# Patient Record
Sex: Female | Born: 1935 | Race: Black or African American | Hispanic: No | Smoking: Former smoker
Health system: Southern US, Community
[De-identification: ages and names within clinical notes are randomized; demographics above are authoritative.]

## PROBLEM LIST (undated history)

## (undated) DIAGNOSIS — N182 Chronic kidney disease, stage 2 (mild): Secondary | ICD-10-CM

## (undated) DIAGNOSIS — J449 Chronic obstructive pulmonary disease, unspecified: Secondary | ICD-10-CM

## (undated) DIAGNOSIS — I10 Essential (primary) hypertension: Secondary | ICD-10-CM

## (undated) DIAGNOSIS — E785 Hyperlipidemia, unspecified: Secondary | ICD-10-CM

## (undated) HISTORY — DX: Essential (primary) hypertension: I10

## (undated) HISTORY — DX: Chronic obstructive pulmonary disease, unspecified: J44.9

## (undated) HISTORY — DX: Hyperlipidemia, unspecified: E78.5

---

## 1997-07-02 ENCOUNTER — Other Ambulatory Visit: Admission: RE | Admit: 1997-07-02 | Discharge: 1997-07-02 | Payer: Self-pay | Admitting: Nephrology

## 1997-10-08 ENCOUNTER — Other Ambulatory Visit: Admission: RE | Admit: 1997-10-08 | Discharge: 1997-10-08 | Payer: Self-pay | Admitting: Nephrology

## 1997-10-29 ENCOUNTER — Encounter: Admission: RE | Admit: 1997-10-29 | Discharge: 1998-01-27 | Payer: Self-pay | Admitting: Nephrology

## 1998-01-14 ENCOUNTER — Ambulatory Visit (HOSPITAL_COMMUNITY): Admission: RE | Admit: 1998-01-14 | Discharge: 1998-01-14 | Payer: Self-pay | Admitting: Nephrology

## 1998-02-06 ENCOUNTER — Encounter: Payer: Self-pay | Admitting: Emergency Medicine

## 1998-02-06 ENCOUNTER — Emergency Department (HOSPITAL_COMMUNITY): Admission: EM | Admit: 1998-02-06 | Discharge: 1998-02-06 | Payer: Self-pay | Admitting: Emergency Medicine

## 1998-05-12 ENCOUNTER — Inpatient Hospital Stay (HOSPITAL_COMMUNITY): Admission: AD | Admit: 1998-05-12 | Discharge: 1998-05-17 | Payer: Self-pay | Admitting: Nephrology

## 1998-05-14 ENCOUNTER — Encounter: Payer: Self-pay | Admitting: Cardiology

## 1999-01-27 ENCOUNTER — Encounter: Admission: RE | Admit: 1999-01-27 | Discharge: 1999-01-27 | Payer: Self-pay | Admitting: Nephrology

## 1999-01-27 ENCOUNTER — Encounter: Payer: Self-pay | Admitting: Nephrology

## 1999-01-30 ENCOUNTER — Emergency Department (HOSPITAL_COMMUNITY): Admission: EM | Admit: 1999-01-30 | Discharge: 1999-01-30 | Payer: Self-pay | Admitting: Emergency Medicine

## 1999-02-26 ENCOUNTER — Other Ambulatory Visit: Admission: RE | Admit: 1999-02-26 | Discharge: 1999-02-26 | Payer: Self-pay | Admitting: Nephrology

## 1999-03-18 ENCOUNTER — Encounter: Payer: Self-pay | Admitting: Nephrology

## 1999-03-18 ENCOUNTER — Encounter: Admission: RE | Admit: 1999-03-18 | Discharge: 1999-03-18 | Payer: Self-pay | Admitting: Nephrology

## 1999-09-02 ENCOUNTER — Encounter: Admission: RE | Admit: 1999-09-02 | Discharge: 1999-12-01 | Payer: Self-pay | Admitting: Nephrology

## 1999-12-11 ENCOUNTER — Emergency Department (HOSPITAL_COMMUNITY): Admission: EM | Admit: 1999-12-11 | Discharge: 1999-12-12 | Payer: Self-pay | Admitting: Emergency Medicine

## 1999-12-14 ENCOUNTER — Encounter: Admission: RE | Admit: 1999-12-14 | Discharge: 2000-01-01 | Payer: Self-pay | Admitting: Orthopedic Surgery

## 2000-02-02 ENCOUNTER — Encounter: Admission: RE | Admit: 2000-02-02 | Discharge: 2000-05-02 | Payer: Self-pay | Admitting: Nephrology

## 2000-05-30 ENCOUNTER — Encounter: Admission: RE | Admit: 2000-05-30 | Discharge: 2000-08-28 | Payer: Self-pay | Admitting: Nephrology

## 2000-08-29 ENCOUNTER — Encounter: Admission: RE | Admit: 2000-08-29 | Discharge: 2000-11-27 | Payer: Self-pay | Admitting: Nephrology

## 2000-10-03 ENCOUNTER — Emergency Department (HOSPITAL_COMMUNITY): Admission: EM | Admit: 2000-10-03 | Discharge: 2000-10-03 | Payer: Self-pay | Admitting: Emergency Medicine

## 2000-10-06 ENCOUNTER — Encounter (HOSPITAL_COMMUNITY): Admission: RE | Admit: 2000-10-06 | Discharge: 2001-01-04 | Payer: Self-pay

## 2001-01-05 ENCOUNTER — Other Ambulatory Visit: Admission: RE | Admit: 2001-01-05 | Discharge: 2001-01-05 | Payer: Self-pay | Admitting: Nephrology

## 2001-01-24 ENCOUNTER — Encounter: Admission: RE | Admit: 2001-01-24 | Discharge: 2001-04-24 | Payer: Self-pay | Admitting: Nephrology

## 2001-01-25 ENCOUNTER — Emergency Department (HOSPITAL_COMMUNITY): Admission: EM | Admit: 2001-01-25 | Discharge: 2001-01-25 | Payer: Self-pay | Admitting: Emergency Medicine

## 2001-01-25 ENCOUNTER — Encounter: Payer: Self-pay | Admitting: Emergency Medicine

## 2001-06-05 ENCOUNTER — Encounter: Payer: Self-pay | Admitting: Family Medicine

## 2001-06-05 ENCOUNTER — Encounter: Admission: RE | Admit: 2001-06-05 | Discharge: 2001-06-05 | Payer: Self-pay | Admitting: Family Medicine

## 2002-06-01 ENCOUNTER — Encounter: Payer: Self-pay | Admitting: Family Medicine

## 2002-06-01 ENCOUNTER — Encounter: Admission: RE | Admit: 2002-06-01 | Discharge: 2002-06-01 | Payer: Self-pay | Admitting: Family Medicine

## 2002-06-07 ENCOUNTER — Ambulatory Visit (HOSPITAL_COMMUNITY): Admission: RE | Admit: 2002-06-07 | Discharge: 2002-06-07 | Payer: Self-pay | Admitting: Family Medicine

## 2002-06-07 ENCOUNTER — Encounter: Payer: Self-pay | Admitting: Family Medicine

## 2002-06-12 ENCOUNTER — Encounter: Payer: Self-pay | Admitting: Family Medicine

## 2002-06-12 ENCOUNTER — Encounter: Admission: RE | Admit: 2002-06-12 | Discharge: 2002-06-12 | Payer: Self-pay | Admitting: Family Medicine

## 2003-03-14 ENCOUNTER — Emergency Department (HOSPITAL_COMMUNITY): Admission: EM | Admit: 2003-03-14 | Discharge: 2003-03-15 | Payer: Self-pay | Admitting: Emergency Medicine

## 2003-04-24 ENCOUNTER — Ambulatory Visit (HOSPITAL_COMMUNITY): Admission: RE | Admit: 2003-04-24 | Discharge: 2003-04-24 | Payer: Self-pay | Admitting: Surgery

## 2003-07-05 ENCOUNTER — Encounter: Admission: RE | Admit: 2003-07-05 | Discharge: 2003-07-05 | Payer: Self-pay | Admitting: Internal Medicine

## 2003-08-27 ENCOUNTER — Ambulatory Visit (HOSPITAL_COMMUNITY): Admission: RE | Admit: 2003-08-27 | Discharge: 2003-08-27 | Payer: Self-pay | Admitting: Gastroenterology

## 2003-10-31 ENCOUNTER — Emergency Department (HOSPITAL_COMMUNITY): Admission: EM | Admit: 2003-10-31 | Discharge: 2003-11-01 | Payer: Self-pay

## 2003-11-01 ENCOUNTER — Emergency Department (HOSPITAL_COMMUNITY): Admission: EM | Admit: 2003-11-01 | Discharge: 2003-11-01 | Payer: Self-pay | Admitting: Emergency Medicine

## 2003-12-16 ENCOUNTER — Encounter: Admission: RE | Admit: 2003-12-16 | Discharge: 2003-12-16 | Payer: Self-pay | Admitting: Internal Medicine

## 2004-07-27 ENCOUNTER — Emergency Department (HOSPITAL_COMMUNITY): Admission: EM | Admit: 2004-07-27 | Discharge: 2004-07-28 | Payer: Self-pay | Admitting: Emergency Medicine

## 2004-12-26 ENCOUNTER — Emergency Department (HOSPITAL_COMMUNITY): Admission: EM | Admit: 2004-12-26 | Discharge: 2004-12-26 | Payer: Self-pay | Admitting: Emergency Medicine

## 2005-03-18 ENCOUNTER — Inpatient Hospital Stay (HOSPITAL_COMMUNITY): Admission: EM | Admit: 2005-03-18 | Discharge: 2005-03-20 | Payer: Self-pay | Admitting: Emergency Medicine

## 2005-10-30 ENCOUNTER — Emergency Department (HOSPITAL_COMMUNITY): Admission: EM | Admit: 2005-10-30 | Discharge: 2005-10-30 | Payer: Self-pay | Admitting: Emergency Medicine

## 2005-11-09 ENCOUNTER — Encounter: Admission: RE | Admit: 2005-11-09 | Discharge: 2005-11-09 | Payer: Self-pay | Admitting: Internal Medicine

## 2007-03-31 ENCOUNTER — Inpatient Hospital Stay (HOSPITAL_COMMUNITY): Admission: EM | Admit: 2007-03-31 | Discharge: 2007-04-03 | Payer: Self-pay | Admitting: Emergency Medicine

## 2008-01-26 ENCOUNTER — Encounter: Admission: RE | Admit: 2008-01-26 | Discharge: 2008-01-26 | Payer: Self-pay | Admitting: Internal Medicine

## 2008-11-04 ENCOUNTER — Emergency Department (HOSPITAL_COMMUNITY): Admission: EM | Admit: 2008-11-04 | Discharge: 2008-11-04 | Payer: Self-pay | Admitting: Emergency Medicine

## 2009-10-31 ENCOUNTER — Inpatient Hospital Stay (HOSPITAL_COMMUNITY): Admission: EM | Admit: 2009-10-31 | Discharge: 2009-11-03 | Payer: Self-pay | Admitting: Emergency Medicine

## 2009-11-01 ENCOUNTER — Encounter (INDEPENDENT_AMBULATORY_CARE_PROVIDER_SITE_OTHER): Payer: Self-pay | Admitting: Internal Medicine

## 2010-02-07 ENCOUNTER — Encounter: Payer: Self-pay | Admitting: Internal Medicine

## 2010-02-08 ENCOUNTER — Encounter: Payer: Self-pay | Admitting: Internal Medicine

## 2010-04-01 LAB — GLUCOSE, CAPILLARY
Glucose-Capillary: 161 mg/dL — ABNORMAL HIGH (ref 70–99)
Glucose-Capillary: 167 mg/dL — ABNORMAL HIGH (ref 70–99)
Glucose-Capillary: 184 mg/dL — ABNORMAL HIGH (ref 70–99)
Glucose-Capillary: 214 mg/dL — ABNORMAL HIGH (ref 70–99)
Glucose-Capillary: 236 mg/dL — ABNORMAL HIGH (ref 70–99)

## 2010-04-01 LAB — LIPID PANEL
Cholesterol: 183 mg/dL (ref 0–200)
HDL: 44 mg/dL (ref >39)
LDL Cholesterol: 80 mg/dL (ref 0–99)
VLDL: 59 mg/dL — ABNORMAL HIGH (ref 0–40)

## 2010-04-01 LAB — BASIC METABOLIC PANEL
Calcium: 9.3 mg/dL (ref 8.4–10.5)
GFR calc Af Amer: 43 mL/min — ABNORMAL LOW (ref >60)
GFR calc non Af Amer: 35 mL/min — ABNORMAL LOW (ref >60)
Potassium: 3.7 mEq/L (ref 3.5–5.1)
Sodium: 139 mEq/L (ref 135–145)

## 2010-04-01 LAB — COMPREHENSIVE METABOLIC PANEL
AST: 22 U/L (ref 0–37)
Alkaline Phosphatase: 56 U/L (ref 39–117)
BUN: 17 mg/dL (ref 6–23)
CO2: 28 mEq/L (ref 19–32)
Calcium: 9.6 mg/dL (ref 8.4–10.5)
GFR calc non Af Amer: 42 mL/min — ABNORMAL LOW (ref >60)
Glucose, Bld: 157 mg/dL — ABNORMAL HIGH (ref 70–99)
Potassium: 3.2 mEq/L — ABNORMAL LOW (ref 3.5–5.1)

## 2010-04-01 LAB — MAGNESIUM
Magnesium: 2.1 mg/dL (ref 1.5–2.5)
Magnesium: 2.1 mg/dL (ref 1.5–2.5)

## 2010-04-01 LAB — TSH: TSH: 1.665 u[IU]/mL (ref 0.350–4.500)

## 2010-04-01 LAB — CBC
MCHC: 34 g/dL (ref 30.0–36.0)
Platelets: 222 10*3/uL (ref 150–400)
RBC: 5.34 MIL/uL — ABNORMAL HIGH (ref 3.87–5.11)
WBC: 10.4 10*3/uL (ref 4.0–10.5)

## 2010-04-01 LAB — CARDIAC PANEL(CRET KIN+CKTOT+MB+TROPI)
CK, MB: 2.6 ng/mL (ref 0.3–4.0)
CK, MB: 3.3 ng/mL (ref 0.3–4.0)
Relative Index: 2.3 (ref 0.0–2.5)
Relative Index: 2.6 — ABNORMAL HIGH (ref 0.0–2.5)
Total CK: 127 U/L (ref 7–177)
Troponin I: 0.03 ng/mL (ref 0.00–0.06)

## 2010-04-01 LAB — HEMOGLOBIN A1C
Hgb A1c MFr Bld: 10 % — ABNORMAL HIGH (ref <5.7)
Mean Plasma Glucose: 240 mg/dL — ABNORMAL HIGH (ref <117)

## 2010-04-02 LAB — BASIC METABOLIC PANEL
BUN: 17 mg/dL (ref 6–23)
CO2: 30 mEq/L (ref 19–32)
Chloride: 103 mEq/L (ref 96–112)
Creatinine, Ser: 1.27 mg/dL — ABNORMAL HIGH (ref 0.4–1.2)
GFR calc Af Amer: 50 mL/min — ABNORMAL LOW (ref >60)
Potassium: 3.3 mEq/L — ABNORMAL LOW (ref 3.5–5.1)

## 2010-04-02 LAB — URINALYSIS, ROUTINE W REFLEX MICROSCOPIC
Bilirubin Urine: NEGATIVE
Ketones, ur: NEGATIVE mg/dL
Leukocytes, UA: NEGATIVE
Nitrite: NEGATIVE
Protein, ur: 100 mg/dL — AB

## 2010-04-02 LAB — CBC
HCT: 47.2 % — ABNORMAL HIGH (ref 36.0–46.0)
MCH: 27.9 pg (ref 26.0–34.0)
MCV: 82.1 fL (ref 78.0–100.0)
Platelets: 215 10*3/uL (ref 150–400)
RBC: 5.76 MIL/uL — ABNORMAL HIGH (ref 3.87–5.11)
WBC: 9.5 10*3/uL (ref 4.0–10.5)

## 2010-04-02 LAB — GLUCOSE, CAPILLARY
Glucose-Capillary: 111 mg/dL — ABNORMAL HIGH (ref 70–99)
Glucose-Capillary: 139 mg/dL — ABNORMAL HIGH (ref 70–99)
Glucose-Capillary: 45 mg/dL — ABNORMAL LOW (ref 70–99)

## 2010-04-02 LAB — DIFFERENTIAL
Eosinophils Absolute: 0 10*3/uL (ref 0.0–0.7)
Eosinophils Relative: 0 % (ref 0–5)
Lymphocytes Relative: 13 % (ref 12–46)
Lymphs Abs: 1.2 10*3/uL (ref 0.7–4.0)
Monocytes Absolute: 0.3 10*3/uL (ref 0.1–1.0)

## 2010-04-02 LAB — POCT CARDIAC MARKERS: Troponin i, poc: <0.05 ng/mL (ref 0.00–0.09)

## 2010-04-02 LAB — RAPID URINE DRUG SCREEN, HOSP PERFORMED
Barbiturates: NOT DETECTED
Benzodiazepines: NOT DETECTED
Cocaine: NOT DETECTED
Opiates: NOT DETECTED

## 2010-04-02 LAB — CARDIAC PANEL(CRET KIN+CKTOT+MB+TROPI)
CK, MB: 4.1 ng/mL — ABNORMAL HIGH (ref 0.3–4.0)
Total CK: 141 U/L (ref 7–177)
Troponin I: 0.03 ng/mL (ref 0.00–0.06)

## 2010-04-23 LAB — GLUCOSE, CAPILLARY: Glucose-Capillary: 171 mg/dL — ABNORMAL HIGH (ref 70–99)

## 2010-06-05 NOTE — Discharge Summary (Signed)
Paula West, Paula West             ACCOUNT NO.:  [id number]   MEDICAL RECORD NO.:  [id number]          PATIENT TYPE:  INP   LOCATION:  4703                         FACILITY:  [facility]   PHYSICIAN:  Fleet Contras, M.D.    DATE OF BIRTH:  [birth year]   DATE OF ADMISSION:  03/31/2007  DATE OF DISCHARGE:  04/03/2007                               DISCHARGE SUMMARY   ADMISSION DIAGNOSES:  1. Right-sided abdominal pain.  2. Sinus bradycardia.  3. Moderately controlled type 2 diabetes mellitus.  4. Poorly controlled hypertension.  5. Acute renal insufficiency.   DISCHARGE DIAGNOSES:  1. Diverticulitis.  2. Renal insufficiency.  3. Sinus bradycardia.  4. Type 2 diabetes mellitus.  5. Bronchial asthma.   HISTORY OF PRESENTING ILLNESS:  [name] is a 77 year old African  American lady with multiple medical problems who presented to the  emergency room at the [facility] with 1-week history of  abdominal pain, bloating  mainly on the right side without radiation  associated with abdominal distention resulting in difficulty in  breathing.  She had no vomiting.  She had no nausea or diarrhea.  No  chest pain, orthopnea, palpitations, or PND.  She denies using any  NSAIDs, aspirin, blood in the stools, or melena.  At the emergency room,  her heart rate was in the 30s and 40s.  The patient was seen by a  cardiologist, Dr. Ritta Slot, who thought that this was likely  medication induced.  She was admitted to the hospital for further  workup.   HOSPITAL COURSE:  On admission, the patient was placed on a telemetry  bed.  Ultrasound scan of the abdomen was performed and this was negative  for gallstones.  Chest x-ray showed cardiomegaly with vascular  congestion.  Serial CPKs and troponin were performed and acute coronary  syndrome was ruled out.  CT scan of the abdomen showed scattered  diverticulosis in the colon, but no evidence of diverticulitis or  abscess collection.  Her  condition improved during the course of  hospitalization.  Her BUN and creatinine on admission was 33/1.85, this  improved to 24/1.47.  Her lipase was 50.  Liver function test was within  normal limits.  On April 03, 2007, she was feeling much better and  tolerating full diet.  Her vital signs were stable.  She was therefore  considered stable for discharge home.   CONDITION ON DISCHARGE:  Stable.   DISPOSITION:  Home.   DISCHARGE MEDICATIONS:  1. Ceftin 500 mg p.o. b.i.d.  2. Flagyl 500 mg t.i.d.  3. Amlodipine 5 mg daily.  4. Benicar 1 tablet p.o. daily 20 mg.   She was advised to stop Bystolic, which had caused her bradycardia.  She  was to follow up with me in the office in about 2-4 weeks.      Fleet Contras, M.D.  Electronically Signed     EA/MEDQ  D:  05/11/2007  T:  05/11/2007  Job:  [id number]
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
## 2010-06-05 NOTE — Op Note (Signed)
NAMEARIZBETH, CAWTHORN                       ACCOUNT NO.:  [id number]   MEDICAL RECORD NO.:  [id number]                   PATIENT TYPE:  AMB   LOCATION:  ENDO                                 FACILITY:  [facility]   PHYSICIAN:  James L. Malon Kindle., M.D.          DATE OF BIRTH:  14-Dec-1932   DATE OF PROCEDURE:  08/27/2003  DATE OF DISCHARGE:                                 OPERATIVE REPORT   PROCEDURE:  Colonoscopy.   ENDOSCOPIST:  Llana Aliment. Edwards, M.D.   MEDICATIONS:  Fentanyl 75 mcg, Versed 6 mg IV.   SCOPE:  The Olympus pediatric adjustable colonoscope.   INDICATIONS FOR PROCEDURE:  The patient has had a change in bowel habits,  with worsening constipation.  She had a CT scan showing a markedly thickened  sigmoid colon.  Could not rule out a possible lesion there, other than  diverticular disease.   DESCRIPTION OF PROCEDURE:  The procedure had been explained to the patient  and a consent obtained.  With the patient in the left lateral decubitus  position, the Olympus scope was inserted and advanced.  The prep was  excellent.  The patient had very marked diverticular disease of the sigmoid  colon.  Several minutes and position changes were required, and after we  finally passed this, we were able to advance rapidly to the cecum.  The  ileocecal valve and appendiceal orifice were seen.  The scope was withdrawn  in the cecum.  The ascending colon, transverse colon and descending colon  were seen well.  Again, marked diverticular disease of the sigmoid colon,  with no evidence of tumor.  No evidence of diverticulitis.  The scope was  withdrawn.  The patient tolerated the procedure well, and was monitored, and received  supplemental oxygen throughout.   ASSESSMENT:  1. Abnormal CT on - code #793.4 - probably due to diverticular disease.  No     evidence of tumor on examination.  2. Severe diverticulosis - code #47.10.   PLAN:  1. Will give the patient a diverticulosis  information sheet.  2. Continue the patient on the MiraLax indefinitely.  3. Encourage a high-fiber diet.  4. I will see her back again as needed.                                               James L. Malon Kindle., M.D.    Waldron Session  D:  08/27/2003  T:  08/27/2003  Job:  [id number]   cc:   Fleet Contras, M.D.  [address]  [place]  [place] [address]  Fax: [phone]

## 2010-06-05 NOTE — Discharge Summary (Signed)
Paula West, Paula West             ACCOUNT NO.:  [id number]   MEDICAL RECORD NO.:  [id number]           PATIENT TYPE:   LOCATION:                               FACILITY:  [facility]   PHYSICIAN:  Fleet Contras, M.D.    DATE OF BIRTH:  01-Feb-1932   DATE OF ADMISSION:  03/18/2005  DATE OF DISCHARGE:  03/20/2005                                 DISCHARGE SUMMARY   DISCHARGE PRESENTING ILLNESS:  Paula West is a 77 year old African-American  lady with a past medical history significant for hypertension, type 2  diabetes mellitus, dyslipidemia and degenerative joint disease of the hips  and knees who presented to the emergency room at [facility] with a  2-3 month history of unsteady gait and frequent falls.  This was of gradual  onset and progressively getting worse.  She had no headaches, nausea or  vomiting.  She does also have dizzy spells and she feels unsteady when she  walks and falls frequently.  Her last fall was the night of presentation  when she tried to step off on a step to her home and she missed a step and  fell on her right hip, which was painful and she had difficulty walking.  At  the emergency room, she had an x-ray and a CT scan of the right hip which  revealed some contusions and degenerative joint disease but no fractures.  She had also had episodes of memory problems but had no confusion or urinary  incontinence.  She had no fevers or chills.  A CT scan of the brain shows  evidence of atrophy and ventriculomegaly, suggestive of hydrocephalus;  specifically, normal pressure hydrocephalus.  She was, therefore, admitted  to the hospital for further evaluation and appropriate therapy.   HOSPITAL COURSE:  On admission, the patient was admitted to a telemetry  monitored bed.  She received intravenous fluids with half normal saline at  75 mL an hour.  CBGs were monitored before meals and at bedtime and was on  NovoLog sliding scale to a moderately sensitive protocol.  An  MRI scan of  the brain was performed to further evaluate the hydrocephalus and this  showed atrophy and ventriculomegaly and again suspicion of normal pressure  hydrocephalus.  Carotid Doppler was performed and this was negative for  significant plaques or carotid stenosis.  Further laboratory data performed  included sed rate which was 10.  Vitamin B12 levels are 373, folic acid 17,  TSH was 2.36 and her hemoglobin A1c was 11.3.  A Neurology consult was  requested and patient was seen by Dr. Avie Echevaria, who suggested that patient  had some dementia and suspicion of normal pressure hydrocephalus.  He  performed a stand, walk, seat test x6 with two chairs 20 feet apart.  Patient was able to perform in about 53 seconds, which he thought was very  fast.  He, therefore, deferred lumbar puncture at this time and recommended  physical therapy to work with the patient balance training prior to a lumbar  puncture which could be performed in the outpatient.  He also recommended  the patient had diabetic neuropathy which will be treated.  The patient has  had no dizziness, staggering or falls since admission and today she feels  she is ready to go home.   She is afebrile.  She is well hydrated and her vital signs are stable.  Her  CPK this morning is 209.  CHEST:  Is clear to auscultation.  HEART:  Sounds 1 and 2 are heard with no murmurs.  ABDOMEN:  Is benign.  Bowel sounds are present.  EXTREMITIES:  Shows no edema, no calf tenderness, swelling.  CNS:  Is alert and oriented x3 with no focal neurological deficits.   Her laboratory data showed white count of 6.8, hemoglobin 15, hematocrit  44.1, platelet count of 265.  Sodium is 139, potassium 3.9, chloride 103,  bicarbonate 228, BUN 17, creatinine 1.4 and glucose of 224.  She is  considered stable for discharge home today.   DISCHARGE DIAGNOSES:  1.  Ataxia, likely due to a normal pressure hydrocephalus.  2.  Right hip contusion, pain has  improved.  3.  Type 2 diabetes mellitus, moderately controlled.  4.  Systemic hypertension, moderately controlled.  5.  Diabetic neuropathy.   DISCHARGE MEDICATIONS WOULD INCLUDE:  1.  Lyrica 50 mg two times daily.  2.  Protonix 40 mg daily.  3.  Lantus insulin 10 units two times daily.  4.  Benicar HCT 40/25 one by mouth daily.  5.  Zocor 40 mg daily.  6.  Metformin 700 mg two times daily.  7.  Glucotrol-XL 10 mg two times daily.  8.  Vicodin 5/500 1-2 tablets q.6 hourly p.r.n. for right hip pain.   CONDITION ON DISCHARGE:  Is stable.   DISPOSITION:  For home.   FOLLOWUP:  Will be with me in 1 week, with Dr. Avie Echevaria in 1-2 weeks.      Fleet Contras, M.D.  Electronically Signed     EA/MEDQ  D:  03/20/2005  T:  03/21/2005  Job:  [id number]   cc:   Genene Churn. Love, M.D.  Fax: [phone]

## 2010-06-22 ENCOUNTER — Inpatient Hospital Stay (HOSPITAL_COMMUNITY)
Admission: EM | Admit: 2010-06-22 | Discharge: 2010-06-25 | DRG: 065 | Disposition: A | Discharge status: Home Health | Payer: PRIVATE HEALTH INSURANCE | Attending: Internal Medicine | Admitting: Internal Medicine

## 2010-06-22 ENCOUNTER — Emergency Department (HOSPITAL_COMMUNITY): Payer: PRIVATE HEALTH INSURANCE

## 2010-06-22 DIAGNOSIS — R32 Unspecified urinary incontinence: Secondary | ICD-10-CM | POA: Diagnosis present

## 2010-06-22 DIAGNOSIS — R279 Unspecified lack of coordination: Secondary | ICD-10-CM | POA: Diagnosis present

## 2010-06-22 DIAGNOSIS — Z79899 Other long term (current) drug therapy: Secondary | ICD-10-CM

## 2010-06-22 DIAGNOSIS — I6350 Cerebral infarction due to unspecified occlusion or stenosis of unspecified cerebral artery: Principal | ICD-10-CM | POA: Diagnosis present

## 2010-06-22 DIAGNOSIS — I129 Hypertensive chronic kidney disease with stage 1 through stage 4 chronic kidney disease, or unspecified chronic kidney disease: Secondary | ICD-10-CM | POA: Diagnosis present

## 2010-06-22 DIAGNOSIS — N189 Chronic kidney disease, unspecified: Secondary | ICD-10-CM | POA: Diagnosis present

## 2010-06-22 DIAGNOSIS — Z794 Long term (current) use of insulin: Secondary | ICD-10-CM

## 2010-06-22 DIAGNOSIS — R5381 Other malaise: Secondary | ICD-10-CM | POA: Diagnosis present

## 2010-06-22 DIAGNOSIS — E1142 Type 2 diabetes mellitus with diabetic polyneuropathy: Secondary | ICD-10-CM | POA: Diagnosis present

## 2010-06-22 DIAGNOSIS — E785 Hyperlipidemia, unspecified: Secondary | ICD-10-CM | POA: Diagnosis present

## 2010-06-22 DIAGNOSIS — E1149 Type 2 diabetes mellitus with other diabetic neurological complication: Secondary | ICD-10-CM | POA: Diagnosis present

## 2010-06-22 DIAGNOSIS — G911 Obstructive hydrocephalus: Secondary | ICD-10-CM | POA: Diagnosis present

## 2010-06-22 DIAGNOSIS — E876 Hypokalemia: Secondary | ICD-10-CM | POA: Diagnosis not present

## 2010-06-22 DIAGNOSIS — M5030 Other cervical disc degeneration, unspecified cervical region: Secondary | ICD-10-CM | POA: Diagnosis present

## 2010-06-22 DIAGNOSIS — D45 Polycythemia vera: Secondary | ICD-10-CM | POA: Diagnosis present

## 2010-06-22 LAB — TROPONIN I: Troponin I: <0.3 ng/mL (ref <0.30)

## 2010-06-22 LAB — BASIC METABOLIC PANEL
Calcium: 9.9 mg/dL (ref 8.4–10.5)
Creatinine, Ser: 1.36 mg/dL — ABNORMAL HIGH (ref 0.4–1.2)
GFR calc Af Amer: 46 mL/min — ABNORMAL LOW (ref >60)
GFR calc non Af Amer: 38 mL/min — ABNORMAL LOW (ref >60)
Sodium: 138 mEq/L (ref 135–145)

## 2010-06-22 LAB — PROTIME-INR
INR: 0.9 (ref 0.00–1.49)
Prothrombin Time: 12.4 seconds (ref 11.6–15.2)

## 2010-06-22 LAB — CBC
HCT: 46.7 % — ABNORMAL HIGH (ref 36.0–46.0)
Hemoglobin: 16.3 g/dL — ABNORMAL HIGH (ref 12.0–15.0)
MCH: 27.4 pg (ref 26.0–34.0)
MCHC: 34.9 g/dL (ref 30.0–36.0)
RBC: 5.95 MIL/uL — ABNORMAL HIGH (ref 3.87–5.11)

## 2010-06-22 LAB — DIFFERENTIAL
Basophils Absolute: 0 10*3/uL (ref 0.0–0.1)
Basophils Relative: 1 % (ref 0–1)
Lymphocytes Relative: 30 % (ref 12–46)
Monocytes Absolute: 0.5 10*3/uL (ref 0.1–1.0)
Monocytes Relative: 6 % (ref 3–12)
Neutro Abs: 5.5 10*3/uL (ref 1.7–7.7)
Neutrophils Relative %: 62 % (ref 43–77)

## 2010-06-22 LAB — URINALYSIS, ROUTINE W REFLEX MICROSCOPIC
Bilirubin Urine: NEGATIVE
Hgb urine dipstick: NEGATIVE
Specific Gravity, Urine: 1.012 (ref 1.005–1.030)
Urobilinogen, UA: 0.2 mg/dL (ref 0.0–1.0)
pH: 6 (ref 5.0–8.0)

## 2010-06-22 LAB — GLUCOSE, CAPILLARY
Glucose-Capillary: 143 mg/dL — ABNORMAL HIGH (ref 70–99)
Glucose-Capillary: 231 mg/dL — ABNORMAL HIGH (ref 70–99)

## 2010-06-22 LAB — ETHANOL: Alcohol, Ethyl (B): <11 mg/dL — ABNORMAL HIGH (ref 0–10)

## 2010-06-23 ENCOUNTER — Inpatient Hospital Stay (HOSPITAL_COMMUNITY): Payer: PRIVATE HEALTH INSURANCE

## 2010-06-23 LAB — POTASSIUM: Potassium: 3.9 mEq/L (ref 3.5–5.1)

## 2010-06-23 LAB — BASIC METABOLIC PANEL
BUN: 26 mg/dL — ABNORMAL HIGH (ref 6–23)
Calcium: 9.5 mg/dL (ref 8.4–10.5)
Creatinine, Ser: 1.37 mg/dL — ABNORMAL HIGH (ref 0.4–1.2)
GFR calc non Af Amer: 38 mL/min — ABNORMAL LOW (ref >60)
Glucose, Bld: 157 mg/dL — ABNORMAL HIGH (ref 70–99)
Potassium: 2.9 mEq/L — ABNORMAL LOW (ref 3.5–5.1)

## 2010-06-23 LAB — CBC
HCT: 41.9 % (ref 36.0–46.0)
MCH: 26.9 pg (ref 26.0–34.0)
MCHC: 34.1 g/dL (ref 30.0–36.0)
MCV: 78.8 fL (ref 78.0–100.0)
Platelets: 238 10*3/uL (ref 150–400)
RDW: 15.9 % — ABNORMAL HIGH (ref 11.5–15.5)

## 2010-06-24 DIAGNOSIS — I6350 Cerebral infarction due to unspecified occlusion or stenosis of unspecified cerebral artery: Secondary | ICD-10-CM

## 2010-06-24 DIAGNOSIS — I517 Cardiomegaly: Secondary | ICD-10-CM

## 2010-06-24 LAB — CBC
HCT: 42.7 % (ref 36.0–46.0)
Hemoglobin: 14.2 g/dL (ref 12.0–15.0)
MCH: 26.4 pg (ref 26.0–34.0)
RBC: 5.37 MIL/uL — ABNORMAL HIGH (ref 3.87–5.11)

## 2010-06-24 LAB — BASIC METABOLIC PANEL
CO2: 29 mEq/L (ref 19–32)
Chloride: 102 mEq/L (ref 96–112)
Creatinine, Ser: 1.23 mg/dL — ABNORMAL HIGH (ref 0.4–1.2)
GFR calc Af Amer: 52 mL/min — ABNORMAL LOW (ref >60)
Glucose, Bld: 137 mg/dL — ABNORMAL HIGH (ref 70–99)

## 2010-06-24 LAB — COMPREHENSIVE METABOLIC PANEL
ALT: 18 U/L (ref 0–35)
Alkaline Phosphatase: 73 U/L (ref 39–117)
BUN: 19 mg/dL (ref 6–23)
CO2: 29 mEq/L (ref 19–32)
GFR calc non Af Amer: 43 mL/min — ABNORMAL LOW (ref >60)
Glucose, Bld: 246 mg/dL — ABNORMAL HIGH (ref 70–99)
Potassium: 3.7 mEq/L (ref 3.5–5.1)
Sodium: 139 mEq/L (ref 135–145)

## 2010-06-24 LAB — URINE CULTURE: Culture  Setup Time: 201206050838

## 2010-06-24 LAB — LIPID PANEL
Cholesterol: 211 mg/dL — ABNORMAL HIGH (ref 0–200)
LDL Cholesterol: 128 mg/dL — ABNORMAL HIGH (ref 0–99)

## 2010-06-24 LAB — GLUCOSE, CAPILLARY
Glucose-Capillary: 142 mg/dL — ABNORMAL HIGH (ref 70–99)
Glucose-Capillary: 252 mg/dL — ABNORMAL HIGH (ref 70–99)
Glucose-Capillary: 184 mg/dL — ABNORMAL HIGH (ref 70–99)

## 2010-06-24 LAB — CARDIAC PANEL(CRET KIN+CKTOT+MB+TROPI): Total CK: 110 U/L (ref 7–177)

## 2010-06-25 LAB — GLUCOSE, CAPILLARY: Glucose-Capillary: 99 mg/dL (ref 70–99)

## 2010-06-25 NOTE — Discharge Summary (Signed)
NAMEMERON, BOCCHINO             ACCOUNT NO.:  [id number]  MEDICAL RECORD NO.:  [id number]  LOCATION:  3743                         FACILITY:  [facility]  PHYSICIAN:  Thad Ranger, MD       DATE OF BIRTH:  Jul 25, 1932  DATE OF ADMISSION:  06/22/2010 DATE OF DISCHARGE:                        DISCHARGE SUMMARY - REFERRING   PRIMARY CARE PHYSICIAN:  Fleet Contras, MD  DISCHARGE DIAGNOSES: 1. Acute cerebrovascular accident. 2. Hypokalemia. 3. Hyperlipidemia. 4. Generalized debility. 5. Diabetes mellitus, type 2, with peripheral neuropathy. 6. Hypertension. 7. History of cognitive deficits with stable hydrocephalus. 8. Obesity. 9. Degenerative disk disease of cervical spine.  MEDICATIONS AT THE TIME OF DISCHARGE: 1. Aspirin 325 mg p.o. daily. 2. Meclizine 12.5 mg p.o. b.i.d. 3. Protonix 40 mg p.o. daily. 4. Albuterol inhaler 2 puffs inhaled every 4 hours as needed for     shortness of breath. 5. Albuterol nebs 1 ampule inhaled every 4 hours as needed for     shortness of breath. 6. Hydralazine 50 mg p.o. b.i.d. 7. Hydrochlorothiazide 25 mg p.o. daily. 8. Lantus insulin 60 units in a.m., 50 units in p.m. 9. Patanol ophthalmic eye drops in both eyes every 6-8 hours. 10.Simvastatin 40 mg p.o. daily.  BRIEF HISTORY OF PRESENT ILLNESS AT THE TIME OF ADMISSION:  Ms. Rotunno is a 77 year old female with multiple medical problems as listed above who presented to the emergency room with being weaker for the past 2-3 days to the point that she had difficulty ambulating.  She also presented as weakness on the left side.  CT head in the emergency room showed only atrophy; however, per the admitting physician, the patient was seen pushing herself with both arms with equal strength on standing, hence it was questionable if she actually had any weakness on the left side.  The patient was admitted for further workup.  RADIOLOGICAL DATA: 1. CT head, June 22, 2010, atrophy and chronic small  vessel disease,     chronic ventricular prominence, presumed related to central     atrophy, no acute findings. 2. MRI of the head on June 24, 2010, showed motion degraded exam,     acute/subacute infarct, lateral aspect right thalamus bordering the     posterior limb of the right internal capsule, global atrophy,     ventricular prominence may be related to atrophy although mild     hydrocephalus cannot be excluded, moderate small vessel disease     type changes. 3. MRA showed intracranial atherosclerotic-type changes. 4. A 2-D echocardiogram, June 24, 2010, showed EF of 55-60%, normal     wall motion, no regional wall motion abnormalities. 5. Carotid Dopplers, June 24, 2010, showed no evidence of ICA stenosis     bilaterally.  BRIEF HOSPITALIZATION COURSE:  Ms. Venturini is a 77 year old female with a history of diabetes with peripheral neuropathy, degenerative disk disease of the cervical spine, hypertension, ataxia presented with acute- on-chronic weakness. 1. Acute CVA.  The patient was admitted to the monitored floor.  CT     head was obtained which did not show any acute stroke, however,     showed atrophy and chronic small vessel disease.  Urine culture  showed only 15,000 colonies.  Hence, no frank UTI.  During my     examination and the patient's history, she did maintain that she     did have left-sided weakness and difficulty in ambulation at home,     hence I obtained an MRI of the brain which confirmed acute/subacute     infarct in the lateral aspect of the right thalamus bordering the     posterior limb of the right internal capsule.  The patient was not     on aspirin prior to the admission.  She was placed on full-dose     aspirin and continued on statins.  The patient underwent full     stroke workup including 2-D echocardiogram which did not show any     thrombus.  No regional wall motion abnormalities and preserved EF     at 55-60%.  Carotid Dopplers were also  negative for any ICA     stenosis.  PT/OT evaluation was done and the patient does need home     physical therapy.  She has 24x7 care at home and declined any     rehab. 2. Hypertension, remained stable.  The patient was continued on her     home medications including hydrochlorothiazide and hydralazine. 3. Diabetes mellitus.  HbA1c 10.8 with mean plasma glucose of 263,     poorly controlled.  The patient is on high-dose Lantus with no     hypoglycemia episodes.  The patient will continue home dose of     insulin and follow up with her primary care physician for a tighter     glycemic control given her acute CVA.  PHYSICAL EXAMINATION:  VITAL SIGNS:  At the time of discharge, temperature 98.6, pulse 57, respirations 16, blood pressure 110/68, O2 sat 98% on room air. GENERAL:  The patient is alert, awake, and oriented not in any acute distress. HEENT:  Anicteric sclerae and conjunctivae.  Pupils equal and reactive to light and accommodation.  EOMI. NECK:  Supple.  No lymphopathy, no JVD. CARDIOVASCULAR:  S1 and S2 clear.  Regular rate and rhythm. CHEST:  Clear to auscultation bilaterally. ABDOMEN:  Soft, nontender, nondistended.  Normal bowel sounds. EXTREMITIES:  No cyanosis, clubbing, or edema noted.  PERTINENT LABORATORY DATA:  Cholesterol 211, HDL 44, LDL 128.  HbA1c 10.8.  Cardiac panel remained negative for any acute ACS.  Discharge followup with Dr. Concepcion Elk within 7-10 days.  DISCHARGE TIME:  Thirty-five minutes.     Thad Ranger, MD     RR/MEDQ  D:  06/25/2010  T:  06/25/2010  Job:  [id number]  cc:   Fleet Contras, M.D.  Electronically Signed by Andres Labrum RAI  on 06/25/2010 01:02:47 PM

## 2010-06-29 NOTE — H&P (Signed)
[name], West             ACCOUNT NO.:  [id number]  MEDICAL RECORD NO.:  [id number]  LOCATION:  [facility]                         FACILITY:  [facility]  PHYSICIAN:  Lonia Blood, M.D.       DATE OF BIRTH:  June 26, 1932  DATE OF ADMISSION:  06/22/2010 DATE OF DISCHARGE:                             HISTORY & PHYSICAL   PRIMARY CARE PHYSICIAN:  Fleet Contras, MD  CHIEF COMPLAINT:  Weak more to the left than the right.  HISTORY OF PRESENT ILLNESS:  [name] is a 77 year old woman with multiple chronic medical problems including diabetes mellitus type 2 with peripheral neuropathy, ataxia, cognitive deficits with some hydrocephalus, urinary incontinence, hypertension, and morbid obesity who came to the emergency room today complaining of being weaker for the past 2-3 days to the point where she has difficulty ambulating.  She thinks she is weaker on the left side.  Emergency room physician assessed her and thought she had a right brain stroke, but the CT scan of the head shows only atrophy and no new findings compared to previously.  Also when I walked in the room, I saw her pushing herself with both arms with equal strength and standing, so I am not sure if she is weaker on the left side.  The patient thinks she is weaker on the left side and she says she has actually been having difficulty ambulating around the house.  She denies any fever, chills, or being acutely ill.  She does not know why she got weaker lately.  She says that the medication has not been changed and she has not seen her primary care physician regularly.  She says that she takes now Lantus 60 units in the morning and 50 units at night and says that dose was given to her last year in the hospital.  Now reviewing the records from the hospital, she was sent home on 40 units of Lantus twice a day.  The patient says that she checks her sugars in the morning and they are into the high 200s, so hypoglycemia might not be  present.  Otherwise, she denies any chest pain.  PAST MEDICAL HISTORY:  Diabetes mellitus type 2 with peripheral neuropathy, hypertension, ataxia, cognitive deficits with stable hydrocephalus, morbid obesity, chronic bronchitis, degenerative disk disease of cervical spine.  SOCIAL HISTORY:  The patient lives with her daughter.  She does not smoke cigarettes, does not drink alcohol.  FAMILY HISTORY:  Positive for hypertension.  HOME MEDICATIONS:  Unsure.  She knows she takes Lantus.  Otherwise when asked about the Benicar, it has been a long discussion and I could not make out if she takes it or not.  The hospital pharmacist is in the process of contacting the patient's pharmacy to figure out what she is taking.  REVIEW OF SYSTEMS:  Per HPI.  All other systems reviewed and are negative.  PHYSICAL EXAM:  VITAL SIGNS:  Upon admission, temperature 97.6, blood pressure 138/70, pulse 70, respirations 20, saturation 96% on room air. GENERAL:  The patient is alert and oriented to place, person, and time. HEENT:  Head, normocephalic and atraumatic.  Eyes, pupils equal, round, and reactive to light.  Extraocular muscles  are intact.  Throat clear. NECK:  Supple.  No JVD.  The general aspect is one of the patient with a truncal obesity and atrophy of both lower extremities bilaterally. CHEST:  Barrel-shaped, hyperinflated.  No wheezes.  No rhonchi.  No crackles. HEART:  Regular without murmurs, rubs, or gallops. ABDOMEN:  Soft and nontender. LOWER EXTREMITIES:  Without edema. NEUROLOGIC:  Cranial nerves II through XII intact.  Speech intact. Repetition intact.  No dysarthria.  She is extremely hard-of-hearing. There is no pronator drift.  She is able to bear weight on both her lower extremities, but she walks with a wide-based gait.  LABORATORY DATA:  On admission, troponin-I less than 0.3.  Sodium is 138, potassium 3.5, chloride 96, bicarbonate 30, BUN 28, creatinine 1.3, glucose 243.   GFR 38.  Alcohol less than 11.  PTT 27, PT 12.4.  Head CT without contrast shows atrophy, chronic small-vessel disease.  ASSESSMENT/PLAN:  This is a 77 year old woman presenting with worsening weakness.  I personally could not find focal deficits.  I think she has generalized weakness, ataxia, so basically she has acute on chronic ataxic weakness which is most likely multifactorial.  She has component from peripheral neuropathy from diabetes, a component from the cervical spine disease, and a component from her hydrocephalus.  Review of the latest MRI of head reveals that she does have stenosis significantly on the right PCA, hence left brain stroke is always possible to happen.  At this point in time, I do think there is any evidence that she had a right brain stroke.  My approach to this is to continue her on aspirin and Zocor, frequent neurological exams, obtain a PT/OT evaluation tomorrow.  Now reviewing the records, it has been awhile since she had an MRI of the cervical spine.  So in case we were extremely worried about worsening ataxia and gait, I will probably start a workup with an MRI of the cervical spine and proceed from there.  Otherwise, I have noted that she has a slight polycythemia, so assume she might have a degree of dehydration.  Her oxygen saturation seems to be good on room air, so I do not think she is chronically hypoxic.  I am going to give her fluids overnight, hold the HCTZ, assess her tomorrow again and try to obtain the home medications to better understand the situation.     Lonia Blood, M.D.     SL/MEDQ  D:  06/22/2010  T:  06/22/2010  Job:  [id number]  cc:   Fleet Contras, M.D.  Electronically Signed by Lonia Blood M.D. on 06/29/2010 09:50:25 AM

## 2010-10-12 LAB — CBC
Hemoglobin: 12.8
Hemoglobin: 14.2
MCHC: 34
MCHC: 33.2
MCV: 81.1
MCV: 81.1
MCV: 81.5
Platelets: 252
RBC: 4.64
RDW: 16.2 — ABNORMAL HIGH
WBC: 7.7

## 2010-10-12 LAB — COMPREHENSIVE METABOLIC PANEL
AST: 29
Albumin: 4
Chloride: 103
Creatinine, Ser: 1.85 — ABNORMAL HIGH
GFR calc Af Amer: 32 — ABNORMAL LOW
Potassium: 3.6
Sodium: 136
Total Bilirubin: 0.7

## 2010-10-12 LAB — POCT CARDIAC MARKERS
CKMB, poc: 1.1
Troponin i, poc: <0.05

## 2010-10-12 LAB — BASIC METABOLIC PANEL
CO2: 27
CO2: 27
Calcium: 10.2
Chloride: 108
Creatinine, Ser: 1.14
GFR calc Af Amer: 42 — ABNORMAL LOW
Glucose, Bld: 114 — ABNORMAL HIGH
Potassium: 3.9
Sodium: 143

## 2010-10-12 LAB — DIFFERENTIAL
Basophils Absolute: 0
Eosinophils Relative: 1
Lymphocytes Relative: 28
Lymphs Abs: 2.1
Monocytes Absolute: 0.5

## 2010-10-12 LAB — URINALYSIS, ROUTINE W REFLEX MICROSCOPIC
Nitrite: NEGATIVE
Protein, ur: NEGATIVE
Urobilinogen, UA: 0.2

## 2010-10-12 LAB — CK TOTAL AND CKMB (NOT AT ARMC)
CK, MB: 2.2
Total CK: 96

## 2010-12-18 ENCOUNTER — Encounter (INDEPENDENT_AMBULATORY_CARE_PROVIDER_SITE_OTHER): Payer: PRIVATE HEALTH INSURANCE | Admitting: Ophthalmology

## 2010-12-18 DIAGNOSIS — E11319 Type 2 diabetes mellitus with unspecified diabetic retinopathy without macular edema: Secondary | ICD-10-CM

## 2010-12-18 DIAGNOSIS — H3581 Retinal edema: Secondary | ICD-10-CM

## 2010-12-18 DIAGNOSIS — I10 Essential (primary) hypertension: Secondary | ICD-10-CM

## 2010-12-18 DIAGNOSIS — E1165 Type 2 diabetes mellitus with hyperglycemia: Secondary | ICD-10-CM

## 2011-01-13 ENCOUNTER — Ambulatory Visit: Payer: PRIVATE HEALTH INSURANCE | Admitting: *Deleted

## 2011-01-20 ENCOUNTER — Encounter: Payer: PRIVATE HEALTH INSURANCE | Attending: Internal Medicine | Admitting: *Deleted

## 2011-01-20 ENCOUNTER — Encounter: Payer: Self-pay | Admitting: *Deleted

## 2011-01-20 DIAGNOSIS — E119 Type 2 diabetes mellitus without complications: Secondary | ICD-10-CM | POA: Insufficient documentation

## 2011-01-20 DIAGNOSIS — Z713 Dietary counseling and surveillance: Secondary | ICD-10-CM | POA: Insufficient documentation

## 2011-01-20 NOTE — Patient Instructions (Signed)
Goals: Identify food types that contain carbohydrate such as starches, fruits and milk Aim for 3 of these Carb Choices at each meal to provide more consistent carb intake and BG levels Continue to take Lantus as directed by MD Read Food Labels for Total Carbohydrate of foods using 15 grams as reference point for each Carb Choice. Get assistance from daughter and aide as needed. Continue checking BG twice daily as directed by MD

## 2011-01-20 NOTE — Progress Notes (Signed)
  Medical Nutrition Therapy:  Appt start time: 1130 end time:  1230.   Assessment:  Primary concerns today: patient here with her daughter and her aide who both listened during visit and offered occasional input and questions. Patient states history of Diabetes for past 20 years. She states she eats 3 meals a day based on foods available in the house. Her aide and daughter often prepare her meals for her. She states she had a stroke this past year which she says affects her ability to remember directions. She is walking with a cane so exercise is limited. She states she checks her BG twice a day in AM and PM, with it ranging from 58 to mid 200's. Marland Kitchen   MEDICATIONS: see list. Diabetes meds are Lantus 70 units in AM and 50 units in PM. She states she received Novolog in hospital but is not taking that now. She also is not taking in oral meds anymore.   DIETARY INTAKE:  Usual eating pattern includes 3 meals and 0-1 snacks per day.  Everyday foods include variety of most food groups.  Avoided foods include ice cream, sweets and corn.    24-hr recall:  B ( AM): grits, eggs, sausage  Snk ( AM): none  L ( PM): bologna sandwich Snk ( PM): none D ( PM): meat, vegetables, some starch Snk ( PM): occasional peanut butter Beverages: water, coffee  Usual physical activity: none stated  Estimated energy needs: 1400 calories 158 g carbohydrates 105 g protein 39 g fat  Progress Towards Goal(s):  In progress.   Nutritional Diagnosis:  NI-5.8.4 Inconsistent carbohydrate intake As related to diabetes.  As evidenced by A1c of 10.8%.    Intervention:  Nutrition counseling and diabetes education provided on rationale for consistent carbohydrate meals. Discussed insulin action of Lantus and factors affecting high and low BGs.  Goals: Identify food types that contain carbohydrate such as starches, fruits and milk Aim for 3 of these Carb Choices at each meal to provide more consistent carb intake and BG  levels Continue to take Lantus as directed by MD Read Food Labels for Total Carbohydrate of foods using 15 grams as reference point for each Carb Choice. Get assistance from daughter and aide as needed. Continue checking BG twice daily as directed by MD Handouts given during visit include:  Carb Counting and Beyond  Food Labels  Monitoring/Evaluation:  Dietary intake, exercise, food labels, and body weight prn.

## 2011-01-27 ENCOUNTER — Other Ambulatory Visit (HOSPITAL_COMMUNITY): Payer: Self-pay | Admitting: Gastroenterology

## 2011-01-29 ENCOUNTER — Ambulatory Visit (HOSPITAL_COMMUNITY)
Admission: RE | Admit: 2011-01-29 | Discharge: 2011-01-29 | Disposition: A | Discharge status: Home / Self Care | Payer: PRIVATE HEALTH INSURANCE | Source: Ambulatory Visit | Attending: Gastroenterology | Admitting: Gastroenterology

## 2011-01-29 DIAGNOSIS — R1310 Dysphagia, unspecified: Secondary | ICD-10-CM | POA: Insufficient documentation

## 2011-04-26 ENCOUNTER — Ambulatory Visit (INDEPENDENT_AMBULATORY_CARE_PROVIDER_SITE_OTHER): Payer: PRIVATE HEALTH INSURANCE | Admitting: Ophthalmology

## 2011-04-26 DIAGNOSIS — H43819 Vitreous degeneration, unspecified eye: Secondary | ICD-10-CM

## 2011-04-26 DIAGNOSIS — E11359 Type 2 diabetes mellitus with proliferative diabetic retinopathy without macular edema: Secondary | ICD-10-CM

## 2011-04-26 DIAGNOSIS — I10 Essential (primary) hypertension: Secondary | ICD-10-CM

## 2011-04-26 DIAGNOSIS — E1165 Type 2 diabetes mellitus with hyperglycemia: Secondary | ICD-10-CM

## 2011-04-26 DIAGNOSIS — H2510 Age-related nuclear cataract, unspecified eye: Secondary | ICD-10-CM

## 2011-05-03 ENCOUNTER — Ambulatory Visit: Payer: PRIVATE HEALTH INSURANCE | Admitting: *Deleted

## 2011-05-05 ENCOUNTER — Ambulatory Visit: Payer: PRIVATE HEALTH INSURANCE | Admitting: *Deleted

## 2011-05-31 ENCOUNTER — Ambulatory Visit: Payer: PRIVATE HEALTH INSURANCE | Admitting: *Deleted

## 2011-08-27 ENCOUNTER — Ambulatory Visit (INDEPENDENT_AMBULATORY_CARE_PROVIDER_SITE_OTHER): Payer: PRIVATE HEALTH INSURANCE | Admitting: Ophthalmology

## 2012-03-15 ENCOUNTER — Encounter (INDEPENDENT_AMBULATORY_CARE_PROVIDER_SITE_OTHER): Payer: PRIVATE HEALTH INSURANCE | Admitting: Ophthalmology

## 2012-04-07 ENCOUNTER — Other Ambulatory Visit: Payer: Self-pay

## 2012-04-07 DIAGNOSIS — Z1231 Encounter for screening mammogram for malignant neoplasm of breast: Secondary | ICD-10-CM

## 2012-04-12 ENCOUNTER — Encounter (INDEPENDENT_AMBULATORY_CARE_PROVIDER_SITE_OTHER): Payer: PRIVATE HEALTH INSURANCE | Admitting: Ophthalmology

## 2012-04-12 DIAGNOSIS — E1165 Type 2 diabetes mellitus with hyperglycemia: Secondary | ICD-10-CM

## 2012-04-12 DIAGNOSIS — E11319 Type 2 diabetes mellitus with unspecified diabetic retinopathy without macular edema: Secondary | ICD-10-CM

## 2012-04-12 DIAGNOSIS — E11359 Type 2 diabetes mellitus with proliferative diabetic retinopathy without macular edema: Secondary | ICD-10-CM

## 2012-04-12 DIAGNOSIS — H43819 Vitreous degeneration, unspecified eye: Secondary | ICD-10-CM

## 2012-04-12 DIAGNOSIS — H3581 Retinal edema: Secondary | ICD-10-CM

## 2012-04-12 DIAGNOSIS — H35039 Hypertensive retinopathy, unspecified eye: Secondary | ICD-10-CM

## 2012-04-12 DIAGNOSIS — I10 Essential (primary) hypertension: Secondary | ICD-10-CM

## 2012-05-16 ENCOUNTER — Ambulatory Visit
Admission: RE | Admit: 2012-05-16 | Discharge: 2012-05-16 | Disposition: A | Discharge status: Home / Self Care | Payer: PRIVATE HEALTH INSURANCE | Source: Ambulatory Visit

## 2012-05-16 DIAGNOSIS — Z1231 Encounter for screening mammogram for malignant neoplasm of breast: Secondary | ICD-10-CM

## 2012-05-18 ENCOUNTER — Other Ambulatory Visit: Payer: Self-pay | Admitting: Internal Medicine

## 2012-05-18 DIAGNOSIS — R928 Other abnormal and inconclusive findings on diagnostic imaging of breast: Secondary | ICD-10-CM

## 2012-06-01 ENCOUNTER — Other Ambulatory Visit: Payer: PRIVATE HEALTH INSURANCE

## 2012-06-15 ENCOUNTER — Other Ambulatory Visit: Payer: PRIVATE HEALTH INSURANCE

## 2012-06-20 ENCOUNTER — Ambulatory Visit
Admission: RE | Admit: 2012-06-20 | Discharge: 2012-06-20 | Disposition: A | Discharge status: Home / Self Care | Payer: PRIVATE HEALTH INSURANCE | Source: Ambulatory Visit | Attending: Internal Medicine | Admitting: Internal Medicine

## 2012-06-20 DIAGNOSIS — R928 Other abnormal and inconclusive findings on diagnostic imaging of breast: Secondary | ICD-10-CM

## 2012-08-29 ENCOUNTER — Ambulatory Visit (INDEPENDENT_AMBULATORY_CARE_PROVIDER_SITE_OTHER): Payer: PRIVATE HEALTH INSURANCE | Admitting: Ophthalmology

## 2013-04-03 ENCOUNTER — Other Ambulatory Visit: Payer: Self-pay

## 2013-04-03 DIAGNOSIS — Z1231 Encounter for screening mammogram for malignant neoplasm of breast: Secondary | ICD-10-CM

## 2013-05-17 ENCOUNTER — Encounter (INDEPENDENT_AMBULATORY_CARE_PROVIDER_SITE_OTHER): Payer: Self-pay

## 2013-05-17 ENCOUNTER — Ambulatory Visit
Admission: RE | Admit: 2013-05-17 | Discharge: 2013-05-17 | Disposition: A | Discharge status: Home / Self Care | Payer: PRIVATE HEALTH INSURANCE | Source: Ambulatory Visit

## 2013-05-17 DIAGNOSIS — Z1231 Encounter for screening mammogram for malignant neoplasm of breast: Secondary | ICD-10-CM

## 2013-05-18 ENCOUNTER — Encounter (INDEPENDENT_AMBULATORY_CARE_PROVIDER_SITE_OTHER): Payer: PRIVATE HEALTH INSURANCE | Admitting: Ophthalmology

## 2013-05-18 DIAGNOSIS — H43819 Vitreous degeneration, unspecified eye: Secondary | ICD-10-CM

## 2013-05-18 DIAGNOSIS — E1165 Type 2 diabetes mellitus with hyperglycemia: Secondary | ICD-10-CM

## 2013-05-18 DIAGNOSIS — H35039 Hypertensive retinopathy, unspecified eye: Secondary | ICD-10-CM

## 2013-05-18 DIAGNOSIS — E1139 Type 2 diabetes mellitus with other diabetic ophthalmic complication: Secondary | ICD-10-CM

## 2013-05-18 DIAGNOSIS — I10 Essential (primary) hypertension: Secondary | ICD-10-CM

## 2013-05-18 DIAGNOSIS — E11359 Type 2 diabetes mellitus with proliferative diabetic retinopathy without macular edema: Secondary | ICD-10-CM

## 2013-05-18 DIAGNOSIS — H2510 Age-related nuclear cataract, unspecified eye: Secondary | ICD-10-CM

## 2013-06-06 ENCOUNTER — Encounter (INDEPENDENT_AMBULATORY_CARE_PROVIDER_SITE_OTHER): Payer: PRIVATE HEALTH INSURANCE | Admitting: Ophthalmology

## 2013-06-13 ENCOUNTER — Encounter (INDEPENDENT_AMBULATORY_CARE_PROVIDER_SITE_OTHER): Payer: PRIVATE HEALTH INSURANCE | Admitting: Ophthalmology

## 2013-06-13 DIAGNOSIS — E11311 Type 2 diabetes mellitus with unspecified diabetic retinopathy with macular edema: Secondary | ICD-10-CM

## 2013-06-13 DIAGNOSIS — E11359 Type 2 diabetes mellitus with proliferative diabetic retinopathy without macular edema: Secondary | ICD-10-CM

## 2013-06-13 DIAGNOSIS — E1165 Type 2 diabetes mellitus with hyperglycemia: Secondary | ICD-10-CM

## 2013-06-13 DIAGNOSIS — E1139 Type 2 diabetes mellitus with other diabetic ophthalmic complication: Secondary | ICD-10-CM

## 2013-06-13 DIAGNOSIS — H43819 Vitreous degeneration, unspecified eye: Secondary | ICD-10-CM

## 2013-07-11 ENCOUNTER — Encounter (INDEPENDENT_AMBULATORY_CARE_PROVIDER_SITE_OTHER): Payer: PRIVATE HEALTH INSURANCE | Admitting: Ophthalmology

## 2013-07-11 DIAGNOSIS — I10 Essential (primary) hypertension: Secondary | ICD-10-CM

## 2013-07-11 DIAGNOSIS — E1165 Type 2 diabetes mellitus with hyperglycemia: Secondary | ICD-10-CM

## 2013-07-11 DIAGNOSIS — H35039 Hypertensive retinopathy, unspecified eye: Secondary | ICD-10-CM | POA: Diagnosis not present

## 2013-07-11 DIAGNOSIS — E11359 Type 2 diabetes mellitus with proliferative diabetic retinopathy without macular edema: Secondary | ICD-10-CM

## 2013-07-11 DIAGNOSIS — H43819 Vitreous degeneration, unspecified eye: Secondary | ICD-10-CM | POA: Diagnosis not present

## 2013-07-11 DIAGNOSIS — E1139 Type 2 diabetes mellitus with other diabetic ophthalmic complication: Secondary | ICD-10-CM

## 2013-07-11 DIAGNOSIS — E11311 Type 2 diabetes mellitus with unspecified diabetic retinopathy with macular edema: Secondary | ICD-10-CM

## 2013-07-11 DIAGNOSIS — H2510 Age-related nuclear cataract, unspecified eye: Secondary | ICD-10-CM

## 2013-08-08 ENCOUNTER — Encounter (INDEPENDENT_AMBULATORY_CARE_PROVIDER_SITE_OTHER): Payer: PRIVATE HEALTH INSURANCE | Admitting: Ophthalmology

## 2013-08-08 DIAGNOSIS — I10 Essential (primary) hypertension: Secondary | ICD-10-CM

## 2013-08-08 DIAGNOSIS — H35039 Hypertensive retinopathy, unspecified eye: Secondary | ICD-10-CM

## 2013-08-08 DIAGNOSIS — E1165 Type 2 diabetes mellitus with hyperglycemia: Secondary | ICD-10-CM | POA: Diagnosis not present

## 2013-08-08 DIAGNOSIS — E11359 Type 2 diabetes mellitus with proliferative diabetic retinopathy without macular edema: Secondary | ICD-10-CM | POA: Diagnosis not present

## 2013-08-08 DIAGNOSIS — E11311 Type 2 diabetes mellitus with unspecified diabetic retinopathy with macular edema: Secondary | ICD-10-CM

## 2013-08-08 DIAGNOSIS — H43819 Vitreous degeneration, unspecified eye: Secondary | ICD-10-CM | POA: Diagnosis not present

## 2013-08-08 DIAGNOSIS — E1139 Type 2 diabetes mellitus with other diabetic ophthalmic complication: Secondary | ICD-10-CM | POA: Diagnosis not present

## 2013-08-08 DIAGNOSIS — H2510 Age-related nuclear cataract, unspecified eye: Secondary | ICD-10-CM

## 2013-09-05 ENCOUNTER — Encounter (INDEPENDENT_AMBULATORY_CARE_PROVIDER_SITE_OTHER): Payer: PRIVATE HEALTH INSURANCE | Admitting: Ophthalmology

## 2013-09-06 ENCOUNTER — Encounter (INDEPENDENT_AMBULATORY_CARE_PROVIDER_SITE_OTHER): Payer: PRIVATE HEALTH INSURANCE | Admitting: Ophthalmology

## 2013-09-17 ENCOUNTER — Encounter (INDEPENDENT_AMBULATORY_CARE_PROVIDER_SITE_OTHER): Payer: PRIVATE HEALTH INSURANCE | Admitting: Ophthalmology

## 2014-03-19 DIAGNOSIS — E119 Type 2 diabetes mellitus without complications: Secondary | ICD-10-CM | POA: Diagnosis not present

## 2014-03-20 DIAGNOSIS — E119 Type 2 diabetes mellitus without complications: Secondary | ICD-10-CM | POA: Diagnosis not present

## 2014-03-21 DIAGNOSIS — E119 Type 2 diabetes mellitus without complications: Secondary | ICD-10-CM | POA: Diagnosis not present

## 2014-03-22 DIAGNOSIS — E119 Type 2 diabetes mellitus without complications: Secondary | ICD-10-CM | POA: Diagnosis not present

## 2014-03-25 DIAGNOSIS — E119 Type 2 diabetes mellitus without complications: Secondary | ICD-10-CM | POA: Diagnosis not present

## 2014-03-26 DIAGNOSIS — E119 Type 2 diabetes mellitus without complications: Secondary | ICD-10-CM | POA: Diagnosis not present

## 2014-03-27 DIAGNOSIS — E119 Type 2 diabetes mellitus without complications: Secondary | ICD-10-CM | POA: Diagnosis not present

## 2014-03-28 DIAGNOSIS — E119 Type 2 diabetes mellitus without complications: Secondary | ICD-10-CM | POA: Diagnosis not present

## 2014-03-29 DIAGNOSIS — E119 Type 2 diabetes mellitus without complications: Secondary | ICD-10-CM | POA: Diagnosis not present

## 2014-03-30 DIAGNOSIS — E119 Type 2 diabetes mellitus without complications: Secondary | ICD-10-CM | POA: Diagnosis not present

## 2014-03-31 DIAGNOSIS — E119 Type 2 diabetes mellitus without complications: Secondary | ICD-10-CM | POA: Diagnosis not present

## 2014-04-01 DIAGNOSIS — E119 Type 2 diabetes mellitus without complications: Secondary | ICD-10-CM | POA: Diagnosis not present

## 2014-04-02 DIAGNOSIS — E119 Type 2 diabetes mellitus without complications: Secondary | ICD-10-CM | POA: Diagnosis not present

## 2014-04-03 DIAGNOSIS — E119 Type 2 diabetes mellitus without complications: Secondary | ICD-10-CM | POA: Diagnosis not present

## 2014-04-04 DIAGNOSIS — E119 Type 2 diabetes mellitus without complications: Secondary | ICD-10-CM | POA: Diagnosis not present

## 2014-04-05 DIAGNOSIS — E1140 Type 2 diabetes mellitus with diabetic neuropathy, unspecified: Secondary | ICD-10-CM | POA: Diagnosis not present

## 2014-04-05 DIAGNOSIS — I10 Essential (primary) hypertension: Secondary | ICD-10-CM | POA: Diagnosis not present

## 2014-04-05 DIAGNOSIS — E119 Type 2 diabetes mellitus without complications: Secondary | ICD-10-CM | POA: Diagnosis not present

## 2014-04-05 DIAGNOSIS — E784 Other hyperlipidemia: Secondary | ICD-10-CM | POA: Diagnosis not present

## 2014-04-05 DIAGNOSIS — M25559 Pain in unspecified hip: Secondary | ICD-10-CM | POA: Diagnosis not present

## 2014-04-05 DIAGNOSIS — J4520 Mild intermittent asthma, uncomplicated: Secondary | ICD-10-CM | POA: Diagnosis not present

## 2014-04-06 DIAGNOSIS — E119 Type 2 diabetes mellitus without complications: Secondary | ICD-10-CM | POA: Diagnosis not present

## 2014-04-07 DIAGNOSIS — E119 Type 2 diabetes mellitus without complications: Secondary | ICD-10-CM | POA: Diagnosis not present

## 2014-04-08 DIAGNOSIS — E119 Type 2 diabetes mellitus without complications: Secondary | ICD-10-CM | POA: Diagnosis not present

## 2014-04-09 DIAGNOSIS — E119 Type 2 diabetes mellitus without complications: Secondary | ICD-10-CM | POA: Diagnosis not present

## 2014-04-10 DIAGNOSIS — E119 Type 2 diabetes mellitus without complications: Secondary | ICD-10-CM | POA: Diagnosis not present

## 2014-04-11 DIAGNOSIS — E119 Type 2 diabetes mellitus without complications: Secondary | ICD-10-CM | POA: Diagnosis not present

## 2014-04-12 DIAGNOSIS — E119 Type 2 diabetes mellitus without complications: Secondary | ICD-10-CM | POA: Diagnosis not present

## 2014-04-13 DIAGNOSIS — E119 Type 2 diabetes mellitus without complications: Secondary | ICD-10-CM | POA: Diagnosis not present

## 2014-04-14 DIAGNOSIS — E119 Type 2 diabetes mellitus without complications: Secondary | ICD-10-CM | POA: Diagnosis not present

## 2014-04-15 DIAGNOSIS — E119 Type 2 diabetes mellitus without complications: Secondary | ICD-10-CM | POA: Diagnosis not present

## 2014-04-16 ENCOUNTER — Other Ambulatory Visit: Payer: Self-pay

## 2014-04-16 DIAGNOSIS — E119 Type 2 diabetes mellitus without complications: Secondary | ICD-10-CM | POA: Diagnosis not present

## 2014-04-16 DIAGNOSIS — Z1231 Encounter for screening mammogram for malignant neoplasm of breast: Secondary | ICD-10-CM

## 2014-04-17 DIAGNOSIS — E119 Type 2 diabetes mellitus without complications: Secondary | ICD-10-CM | POA: Diagnosis not present

## 2014-04-18 DIAGNOSIS — E119 Type 2 diabetes mellitus without complications: Secondary | ICD-10-CM | POA: Diagnosis not present

## 2014-04-19 DIAGNOSIS — E119 Type 2 diabetes mellitus without complications: Secondary | ICD-10-CM | POA: Diagnosis not present

## 2014-04-20 DIAGNOSIS — E119 Type 2 diabetes mellitus without complications: Secondary | ICD-10-CM | POA: Diagnosis not present

## 2014-04-21 DIAGNOSIS — E119 Type 2 diabetes mellitus without complications: Secondary | ICD-10-CM | POA: Diagnosis not present

## 2014-04-22 DIAGNOSIS — E119 Type 2 diabetes mellitus without complications: Secondary | ICD-10-CM | POA: Diagnosis not present

## 2014-04-23 DIAGNOSIS — E119 Type 2 diabetes mellitus without complications: Secondary | ICD-10-CM | POA: Diagnosis not present

## 2014-04-24 DIAGNOSIS — E119 Type 2 diabetes mellitus without complications: Secondary | ICD-10-CM | POA: Diagnosis not present

## 2014-04-25 DIAGNOSIS — E119 Type 2 diabetes mellitus without complications: Secondary | ICD-10-CM | POA: Diagnosis not present

## 2014-04-26 DIAGNOSIS — E119 Type 2 diabetes mellitus without complications: Secondary | ICD-10-CM | POA: Diagnosis not present

## 2014-04-27 DIAGNOSIS — E119 Type 2 diabetes mellitus without complications: Secondary | ICD-10-CM | POA: Diagnosis not present

## 2014-04-28 DIAGNOSIS — E119 Type 2 diabetes mellitus without complications: Secondary | ICD-10-CM | POA: Diagnosis not present

## 2014-04-29 DIAGNOSIS — E119 Type 2 diabetes mellitus without complications: Secondary | ICD-10-CM | POA: Diagnosis not present

## 2014-04-30 DIAGNOSIS — E119 Type 2 diabetes mellitus without complications: Secondary | ICD-10-CM | POA: Diagnosis not present

## 2014-05-01 DIAGNOSIS — E119 Type 2 diabetes mellitus without complications: Secondary | ICD-10-CM | POA: Diagnosis not present

## 2014-05-02 DIAGNOSIS — E119 Type 2 diabetes mellitus without complications: Secondary | ICD-10-CM | POA: Diagnosis not present

## 2014-05-03 DIAGNOSIS — E119 Type 2 diabetes mellitus without complications: Secondary | ICD-10-CM | POA: Diagnosis not present

## 2014-05-04 DIAGNOSIS — E119 Type 2 diabetes mellitus without complications: Secondary | ICD-10-CM | POA: Diagnosis not present

## 2014-05-05 DIAGNOSIS — E119 Type 2 diabetes mellitus without complications: Secondary | ICD-10-CM | POA: Diagnosis not present

## 2014-05-06 DIAGNOSIS — E119 Type 2 diabetes mellitus without complications: Secondary | ICD-10-CM | POA: Diagnosis not present

## 2014-05-07 DIAGNOSIS — E119 Type 2 diabetes mellitus without complications: Secondary | ICD-10-CM | POA: Diagnosis not present

## 2014-05-08 DIAGNOSIS — E119 Type 2 diabetes mellitus without complications: Secondary | ICD-10-CM | POA: Diagnosis not present

## 2014-05-09 DIAGNOSIS — E119 Type 2 diabetes mellitus without complications: Secondary | ICD-10-CM | POA: Diagnosis not present

## 2014-05-10 DIAGNOSIS — E119 Type 2 diabetes mellitus without complications: Secondary | ICD-10-CM | POA: Diagnosis not present

## 2014-05-10 DIAGNOSIS — E11341 Type 2 diabetes mellitus with severe nonproliferative diabetic retinopathy with macular edema: Secondary | ICD-10-CM | POA: Diagnosis not present

## 2014-05-10 DIAGNOSIS — H2513 Age-related nuclear cataract, bilateral: Secondary | ICD-10-CM | POA: Diagnosis not present

## 2014-05-11 DIAGNOSIS — E119 Type 2 diabetes mellitus without complications: Secondary | ICD-10-CM | POA: Diagnosis not present

## 2014-05-12 DIAGNOSIS — E119 Type 2 diabetes mellitus without complications: Secondary | ICD-10-CM | POA: Diagnosis not present

## 2014-05-13 DIAGNOSIS — E119 Type 2 diabetes mellitus without complications: Secondary | ICD-10-CM | POA: Diagnosis not present

## 2014-05-14 DIAGNOSIS — E119 Type 2 diabetes mellitus without complications: Secondary | ICD-10-CM | POA: Diagnosis not present

## 2014-05-15 DIAGNOSIS — E119 Type 2 diabetes mellitus without complications: Secondary | ICD-10-CM | POA: Diagnosis not present

## 2014-05-16 DIAGNOSIS — E119 Type 2 diabetes mellitus without complications: Secondary | ICD-10-CM | POA: Diagnosis not present

## 2014-05-17 ENCOUNTER — Encounter (INDEPENDENT_AMBULATORY_CARE_PROVIDER_SITE_OTHER): Payer: Medicare Other | Admitting: Ophthalmology

## 2014-05-17 DIAGNOSIS — H35372 Puckering of macula, left eye: Secondary | ICD-10-CM | POA: Diagnosis not present

## 2014-05-17 DIAGNOSIS — H35033 Hypertensive retinopathy, bilateral: Secondary | ICD-10-CM

## 2014-05-17 DIAGNOSIS — E11311 Type 2 diabetes mellitus with unspecified diabetic retinopathy with macular edema: Secondary | ICD-10-CM

## 2014-05-17 DIAGNOSIS — E11351 Type 2 diabetes mellitus with proliferative diabetic retinopathy with macular edema: Secondary | ICD-10-CM | POA: Diagnosis not present

## 2014-05-17 DIAGNOSIS — E119 Type 2 diabetes mellitus without complications: Secondary | ICD-10-CM | POA: Diagnosis not present

## 2014-05-17 DIAGNOSIS — H43813 Vitreous degeneration, bilateral: Secondary | ICD-10-CM | POA: Diagnosis not present

## 2014-05-17 DIAGNOSIS — I10 Essential (primary) hypertension: Secondary | ICD-10-CM | POA: Diagnosis not present

## 2014-05-18 DIAGNOSIS — E119 Type 2 diabetes mellitus without complications: Secondary | ICD-10-CM | POA: Diagnosis not present

## 2014-05-19 DIAGNOSIS — E119 Type 2 diabetes mellitus without complications: Secondary | ICD-10-CM | POA: Diagnosis not present

## 2014-05-20 ENCOUNTER — Ambulatory Visit: Payer: Medicare Other

## 2014-05-20 ENCOUNTER — Encounter (INDEPENDENT_AMBULATORY_CARE_PROVIDER_SITE_OTHER): Payer: Self-pay | Admitting: Ophthalmology

## 2014-05-20 DIAGNOSIS — E119 Type 2 diabetes mellitus without complications: Secondary | ICD-10-CM | POA: Diagnosis not present

## 2014-05-21 DIAGNOSIS — E119 Type 2 diabetes mellitus without complications: Secondary | ICD-10-CM | POA: Diagnosis not present

## 2014-05-22 DIAGNOSIS — E119 Type 2 diabetes mellitus without complications: Secondary | ICD-10-CM | POA: Diagnosis not present

## 2014-05-23 DIAGNOSIS — E119 Type 2 diabetes mellitus without complications: Secondary | ICD-10-CM | POA: Diagnosis not present

## 2014-05-24 DIAGNOSIS — E119 Type 2 diabetes mellitus without complications: Secondary | ICD-10-CM | POA: Diagnosis not present

## 2014-05-25 DIAGNOSIS — E119 Type 2 diabetes mellitus without complications: Secondary | ICD-10-CM | POA: Diagnosis not present

## 2014-05-26 DIAGNOSIS — E119 Type 2 diabetes mellitus without complications: Secondary | ICD-10-CM | POA: Diagnosis not present

## 2014-05-27 DIAGNOSIS — E119 Type 2 diabetes mellitus without complications: Secondary | ICD-10-CM | POA: Diagnosis not present

## 2014-05-28 DIAGNOSIS — E119 Type 2 diabetes mellitus without complications: Secondary | ICD-10-CM | POA: Diagnosis not present

## 2014-05-29 DIAGNOSIS — E119 Type 2 diabetes mellitus without complications: Secondary | ICD-10-CM | POA: Diagnosis not present

## 2014-05-30 DIAGNOSIS — E119 Type 2 diabetes mellitus without complications: Secondary | ICD-10-CM | POA: Diagnosis not present

## 2014-05-31 DIAGNOSIS — E119 Type 2 diabetes mellitus without complications: Secondary | ICD-10-CM | POA: Diagnosis not present

## 2014-06-01 DIAGNOSIS — E119 Type 2 diabetes mellitus without complications: Secondary | ICD-10-CM | POA: Diagnosis not present

## 2014-06-02 DIAGNOSIS — E119 Type 2 diabetes mellitus without complications: Secondary | ICD-10-CM | POA: Diagnosis not present

## 2014-06-03 DIAGNOSIS — E119 Type 2 diabetes mellitus without complications: Secondary | ICD-10-CM | POA: Diagnosis not present

## 2014-06-04 DIAGNOSIS — E119 Type 2 diabetes mellitus without complications: Secondary | ICD-10-CM | POA: Diagnosis not present

## 2014-06-05 DIAGNOSIS — E119 Type 2 diabetes mellitus without complications: Secondary | ICD-10-CM | POA: Diagnosis not present

## 2014-06-06 DIAGNOSIS — E119 Type 2 diabetes mellitus without complications: Secondary | ICD-10-CM | POA: Diagnosis not present

## 2014-06-07 DIAGNOSIS — E119 Type 2 diabetes mellitus without complications: Secondary | ICD-10-CM | POA: Diagnosis not present

## 2014-06-08 DIAGNOSIS — E119 Type 2 diabetes mellitus without complications: Secondary | ICD-10-CM | POA: Diagnosis not present

## 2014-06-09 DIAGNOSIS — E119 Type 2 diabetes mellitus without complications: Secondary | ICD-10-CM | POA: Diagnosis not present

## 2014-06-10 DIAGNOSIS — E119 Type 2 diabetes mellitus without complications: Secondary | ICD-10-CM | POA: Diagnosis not present

## 2014-06-11 ENCOUNTER — Encounter (INDEPENDENT_AMBULATORY_CARE_PROVIDER_SITE_OTHER): Payer: Medicare Other | Admitting: Ophthalmology

## 2014-06-11 DIAGNOSIS — I10 Essential (primary) hypertension: Secondary | ICD-10-CM | POA: Diagnosis not present

## 2014-06-11 DIAGNOSIS — H35372 Puckering of macula, left eye: Secondary | ICD-10-CM

## 2014-06-11 DIAGNOSIS — H35033 Hypertensive retinopathy, bilateral: Secondary | ICD-10-CM

## 2014-06-11 DIAGNOSIS — E119 Type 2 diabetes mellitus without complications: Secondary | ICD-10-CM | POA: Diagnosis not present

## 2014-06-11 DIAGNOSIS — E11311 Type 2 diabetes mellitus with unspecified diabetic retinopathy with macular edema: Secondary | ICD-10-CM | POA: Diagnosis not present

## 2014-06-11 DIAGNOSIS — H43813 Vitreous degeneration, bilateral: Secondary | ICD-10-CM

## 2014-06-11 DIAGNOSIS — E11351 Type 2 diabetes mellitus with proliferative diabetic retinopathy with macular edema: Secondary | ICD-10-CM | POA: Diagnosis not present

## 2014-06-12 DIAGNOSIS — E119 Type 2 diabetes mellitus without complications: Secondary | ICD-10-CM | POA: Diagnosis not present

## 2014-06-13 DIAGNOSIS — E119 Type 2 diabetes mellitus without complications: Secondary | ICD-10-CM | POA: Diagnosis not present

## 2014-06-14 DIAGNOSIS — E119 Type 2 diabetes mellitus without complications: Secondary | ICD-10-CM | POA: Diagnosis not present

## 2014-06-15 DIAGNOSIS — E119 Type 2 diabetes mellitus without complications: Secondary | ICD-10-CM | POA: Diagnosis not present

## 2014-06-16 DIAGNOSIS — E119 Type 2 diabetes mellitus without complications: Secondary | ICD-10-CM | POA: Diagnosis not present

## 2014-06-17 DIAGNOSIS — E119 Type 2 diabetes mellitus without complications: Secondary | ICD-10-CM | POA: Diagnosis not present

## 2014-06-18 DIAGNOSIS — E119 Type 2 diabetes mellitus without complications: Secondary | ICD-10-CM | POA: Diagnosis not present

## 2014-06-19 DIAGNOSIS — E119 Type 2 diabetes mellitus without complications: Secondary | ICD-10-CM | POA: Diagnosis not present

## 2014-06-20 DIAGNOSIS — E119 Type 2 diabetes mellitus without complications: Secondary | ICD-10-CM | POA: Diagnosis not present

## 2014-06-21 DIAGNOSIS — E119 Type 2 diabetes mellitus without complications: Secondary | ICD-10-CM | POA: Diagnosis not present

## 2014-06-22 DIAGNOSIS — E119 Type 2 diabetes mellitus without complications: Secondary | ICD-10-CM | POA: Diagnosis not present

## 2014-06-23 DIAGNOSIS — E119 Type 2 diabetes mellitus without complications: Secondary | ICD-10-CM | POA: Diagnosis not present

## 2014-06-24 DIAGNOSIS — E119 Type 2 diabetes mellitus without complications: Secondary | ICD-10-CM | POA: Diagnosis not present

## 2014-06-25 DIAGNOSIS — E119 Type 2 diabetes mellitus without complications: Secondary | ICD-10-CM | POA: Diagnosis not present

## 2014-06-26 DIAGNOSIS — E119 Type 2 diabetes mellitus without complications: Secondary | ICD-10-CM | POA: Diagnosis not present

## 2014-06-27 DIAGNOSIS — E119 Type 2 diabetes mellitus without complications: Secondary | ICD-10-CM | POA: Diagnosis not present

## 2014-06-28 DIAGNOSIS — E119 Type 2 diabetes mellitus without complications: Secondary | ICD-10-CM | POA: Diagnosis not present

## 2014-06-29 DIAGNOSIS — E119 Type 2 diabetes mellitus without complications: Secondary | ICD-10-CM | POA: Diagnosis not present

## 2014-06-30 DIAGNOSIS — E119 Type 2 diabetes mellitus without complications: Secondary | ICD-10-CM | POA: Diagnosis not present

## 2014-07-01 DIAGNOSIS — E119 Type 2 diabetes mellitus without complications: Secondary | ICD-10-CM | POA: Diagnosis not present

## 2014-07-02 DIAGNOSIS — E119 Type 2 diabetes mellitus without complications: Secondary | ICD-10-CM | POA: Diagnosis not present

## 2014-07-03 DIAGNOSIS — E119 Type 2 diabetes mellitus without complications: Secondary | ICD-10-CM | POA: Diagnosis not present

## 2014-07-04 DIAGNOSIS — E119 Type 2 diabetes mellitus without complications: Secondary | ICD-10-CM | POA: Diagnosis not present

## 2014-07-05 DIAGNOSIS — J4520 Mild intermittent asthma, uncomplicated: Secondary | ICD-10-CM | POA: Diagnosis not present

## 2014-07-05 DIAGNOSIS — M1000 Idiopathic gout, unspecified site: Secondary | ICD-10-CM | POA: Diagnosis not present

## 2014-07-05 DIAGNOSIS — I10 Essential (primary) hypertension: Secondary | ICD-10-CM | POA: Diagnosis not present

## 2014-07-05 DIAGNOSIS — M1610 Unilateral primary osteoarthritis, unspecified hip: Secondary | ICD-10-CM | POA: Diagnosis not present

## 2014-07-05 DIAGNOSIS — E119 Type 2 diabetes mellitus without complications: Secondary | ICD-10-CM | POA: Diagnosis not present

## 2014-07-05 DIAGNOSIS — E1140 Type 2 diabetes mellitus with diabetic neuropathy, unspecified: Secondary | ICD-10-CM | POA: Diagnosis not present

## 2014-07-06 DIAGNOSIS — E119 Type 2 diabetes mellitus without complications: Secondary | ICD-10-CM | POA: Diagnosis not present

## 2014-07-07 DIAGNOSIS — E119 Type 2 diabetes mellitus without complications: Secondary | ICD-10-CM | POA: Diagnosis not present

## 2014-07-17 DIAGNOSIS — M1990 Unspecified osteoarthritis, unspecified site: Secondary | ICD-10-CM | POA: Diagnosis not present

## 2014-07-24 ENCOUNTER — Encounter (INDEPENDENT_AMBULATORY_CARE_PROVIDER_SITE_OTHER): Payer: Medicare Other | Admitting: Ophthalmology

## 2014-07-31 DIAGNOSIS — M25552 Pain in left hip: Secondary | ICD-10-CM | POA: Diagnosis not present

## 2014-08-01 ENCOUNTER — Encounter (INDEPENDENT_AMBULATORY_CARE_PROVIDER_SITE_OTHER): Payer: Medicare Other | Admitting: Ophthalmology

## 2014-08-01 DIAGNOSIS — H35033 Hypertensive retinopathy, bilateral: Secondary | ICD-10-CM

## 2014-08-01 DIAGNOSIS — E11351 Type 2 diabetes mellitus with proliferative diabetic retinopathy with macular edema: Secondary | ICD-10-CM

## 2014-08-01 DIAGNOSIS — H43813 Vitreous degeneration, bilateral: Secondary | ICD-10-CM

## 2014-08-01 DIAGNOSIS — E11311 Type 2 diabetes mellitus with unspecified diabetic retinopathy with macular edema: Secondary | ICD-10-CM

## 2014-08-01 DIAGNOSIS — I10 Essential (primary) hypertension: Secondary | ICD-10-CM | POA: Diagnosis not present

## 2014-08-14 DIAGNOSIS — M25552 Pain in left hip: Secondary | ICD-10-CM | POA: Diagnosis not present

## 2014-08-16 ENCOUNTER — Other Ambulatory Visit: Payer: Self-pay | Admitting: Physician Assistant

## 2014-08-16 DIAGNOSIS — M25552 Pain in left hip: Secondary | ICD-10-CM

## 2014-08-18 DIAGNOSIS — E161 Other hypoglycemia: Secondary | ICD-10-CM | POA: Diagnosis not present

## 2014-08-21 DIAGNOSIS — H524 Presbyopia: Secondary | ICD-10-CM | POA: Diagnosis not present

## 2014-08-21 DIAGNOSIS — H25011 Cortical age-related cataract, right eye: Secondary | ICD-10-CM | POA: Diagnosis not present

## 2014-08-21 DIAGNOSIS — H35359 Cystoid macular degeneration, unspecified eye: Secondary | ICD-10-CM | POA: Diagnosis not present

## 2014-08-21 DIAGNOSIS — H25042 Posterior subcapsular polar age-related cataract, left eye: Secondary | ICD-10-CM | POA: Diagnosis not present

## 2014-08-21 DIAGNOSIS — E11359 Type 2 diabetes mellitus with proliferative diabetic retinopathy without macular edema: Secondary | ICD-10-CM | POA: Diagnosis not present

## 2014-08-27 ENCOUNTER — Inpatient Hospital Stay: Admission: RE | Admit: 2014-08-27 | Payer: Self-pay | Source: Ambulatory Visit

## 2014-09-04 DIAGNOSIS — M1610 Unilateral primary osteoarthritis, unspecified hip: Secondary | ICD-10-CM | POA: Diagnosis not present

## 2014-09-04 DIAGNOSIS — E1140 Type 2 diabetes mellitus with diabetic neuropathy, unspecified: Secondary | ICD-10-CM | POA: Diagnosis not present

## 2014-09-04 DIAGNOSIS — I10 Essential (primary) hypertension: Secondary | ICD-10-CM | POA: Diagnosis not present

## 2014-09-04 DIAGNOSIS — J4520 Mild intermittent asthma, uncomplicated: Secondary | ICD-10-CM | POA: Diagnosis not present

## 2014-09-13 ENCOUNTER — Encounter (INDEPENDENT_AMBULATORY_CARE_PROVIDER_SITE_OTHER): Payer: Medicare Other | Admitting: Ophthalmology

## 2014-09-13 DIAGNOSIS — H43813 Vitreous degeneration, bilateral: Secondary | ICD-10-CM | POA: Diagnosis not present

## 2014-09-13 DIAGNOSIS — E11351 Type 2 diabetes mellitus with proliferative diabetic retinopathy with macular edema: Secondary | ICD-10-CM

## 2014-09-13 DIAGNOSIS — I10 Essential (primary) hypertension: Secondary | ICD-10-CM | POA: Diagnosis not present

## 2014-09-13 DIAGNOSIS — H35033 Hypertensive retinopathy, bilateral: Secondary | ICD-10-CM

## 2014-09-13 DIAGNOSIS — E11311 Type 2 diabetes mellitus with unspecified diabetic retinopathy with macular edema: Secondary | ICD-10-CM

## 2014-10-01 DIAGNOSIS — H2512 Age-related nuclear cataract, left eye: Secondary | ICD-10-CM | POA: Diagnosis not present

## 2014-10-04 DIAGNOSIS — E1140 Type 2 diabetes mellitus with diabetic neuropathy, unspecified: Secondary | ICD-10-CM | POA: Diagnosis not present

## 2014-10-04 DIAGNOSIS — E784 Other hyperlipidemia: Secondary | ICD-10-CM | POA: Diagnosis not present

## 2014-10-04 DIAGNOSIS — I10 Essential (primary) hypertension: Secondary | ICD-10-CM | POA: Diagnosis not present

## 2014-10-11 ENCOUNTER — Encounter (INDEPENDENT_AMBULATORY_CARE_PROVIDER_SITE_OTHER): Payer: Medicare Other | Admitting: Ophthalmology

## 2014-10-11 DIAGNOSIS — E11311 Type 2 diabetes mellitus with unspecified diabetic retinopathy with macular edema: Secondary | ICD-10-CM

## 2014-10-11 DIAGNOSIS — H35033 Hypertensive retinopathy, bilateral: Secondary | ICD-10-CM | POA: Diagnosis not present

## 2014-10-11 DIAGNOSIS — I10 Essential (primary) hypertension: Secondary | ICD-10-CM

## 2014-10-11 DIAGNOSIS — H43813 Vitreous degeneration, bilateral: Secondary | ICD-10-CM | POA: Diagnosis not present

## 2014-10-11 DIAGNOSIS — E11351 Type 2 diabetes mellitus with proliferative diabetic retinopathy with macular edema: Secondary | ICD-10-CM | POA: Diagnosis not present

## 2014-10-28 DIAGNOSIS — H25011 Cortical age-related cataract, right eye: Secondary | ICD-10-CM | POA: Diagnosis not present

## 2014-10-28 DIAGNOSIS — H2511 Age-related nuclear cataract, right eye: Secondary | ICD-10-CM | POA: Diagnosis not present

## 2014-11-07 ENCOUNTER — Other Ambulatory Visit: Payer: Self-pay | Admitting: Physician Assistant

## 2014-11-10 ENCOUNTER — Ambulatory Visit
Admission: RE | Admit: 2014-11-10 | Discharge: 2014-11-10 | Disposition: A | Discharge status: Home / Self Care | Payer: Medicare Other | Source: Ambulatory Visit | Attending: Physician Assistant | Admitting: Physician Assistant

## 2014-11-10 DIAGNOSIS — M25552 Pain in left hip: Secondary | ICD-10-CM

## 2014-11-11 ENCOUNTER — Other Ambulatory Visit: Payer: Self-pay | Admitting: Physician Assistant

## 2014-11-11 DIAGNOSIS — N83202 Unspecified ovarian cyst, left side: Secondary | ICD-10-CM

## 2014-11-12 ENCOUNTER — Other Ambulatory Visit: Payer: Medicare Other

## 2014-11-14 DIAGNOSIS — M25552 Pain in left hip: Secondary | ICD-10-CM | POA: Diagnosis not present

## 2014-11-14 DIAGNOSIS — M1990 Unspecified osteoarthritis, unspecified site: Secondary | ICD-10-CM | POA: Diagnosis not present

## 2014-11-22 ENCOUNTER — Encounter (INDEPENDENT_AMBULATORY_CARE_PROVIDER_SITE_OTHER): Payer: Medicare Other | Admitting: Ophthalmology

## 2014-11-22 DIAGNOSIS — H35033 Hypertensive retinopathy, bilateral: Secondary | ICD-10-CM | POA: Diagnosis not present

## 2014-11-22 DIAGNOSIS — E11311 Type 2 diabetes mellitus with unspecified diabetic retinopathy with macular edema: Secondary | ICD-10-CM

## 2014-11-22 DIAGNOSIS — E113513 Type 2 diabetes mellitus with proliferative diabetic retinopathy with macular edema, bilateral: Secondary | ICD-10-CM

## 2014-11-22 DIAGNOSIS — I10 Essential (primary) hypertension: Secondary | ICD-10-CM

## 2014-11-22 DIAGNOSIS — H43813 Vitreous degeneration, bilateral: Secondary | ICD-10-CM | POA: Diagnosis not present

## 2014-11-22 DIAGNOSIS — H2511 Age-related nuclear cataract, right eye: Secondary | ICD-10-CM | POA: Diagnosis not present

## 2014-11-25 ENCOUNTER — Ambulatory Visit
Admission: RE | Admit: 2014-11-25 | Discharge: 2014-11-25 | Disposition: A | Discharge status: Home / Self Care | Payer: Medicare Other | Source: Ambulatory Visit | Attending: Physician Assistant | Admitting: Physician Assistant

## 2014-11-25 DIAGNOSIS — N83202 Unspecified ovarian cyst, left side: Secondary | ICD-10-CM

## 2014-11-25 DIAGNOSIS — N83201 Unspecified ovarian cyst, right side: Secondary | ICD-10-CM | POA: Diagnosis not present

## 2014-12-09 DIAGNOSIS — I6789 Other cerebrovascular disease: Secondary | ICD-10-CM | POA: Diagnosis not present

## 2014-12-09 DIAGNOSIS — I10 Essential (primary) hypertension: Secondary | ICD-10-CM | POA: Diagnosis not present

## 2014-12-09 DIAGNOSIS — E1140 Type 2 diabetes mellitus with diabetic neuropathy, unspecified: Secondary | ICD-10-CM | POA: Diagnosis not present

## 2014-12-09 DIAGNOSIS — D279 Benign neoplasm of unspecified ovary: Secondary | ICD-10-CM | POA: Diagnosis not present

## 2014-12-10 DIAGNOSIS — H2511 Age-related nuclear cataract, right eye: Secondary | ICD-10-CM | POA: Diagnosis not present

## 2014-12-25 DIAGNOSIS — R102 Pelvic and perineal pain: Secondary | ICD-10-CM | POA: Diagnosis not present

## 2014-12-25 DIAGNOSIS — Z01411 Encounter for gynecological examination (general) (routine) with abnormal findings: Secondary | ICD-10-CM | POA: Diagnosis not present

## 2014-12-25 DIAGNOSIS — N83209 Unspecified ovarian cyst, unspecified side: Secondary | ICD-10-CM | POA: Diagnosis not present

## 2015-01-01 DIAGNOSIS — Z23 Encounter for immunization: Secondary | ICD-10-CM | POA: Diagnosis not present

## 2015-01-03 ENCOUNTER — Encounter (INDEPENDENT_AMBULATORY_CARE_PROVIDER_SITE_OTHER): Payer: Medicare Other | Admitting: Ophthalmology

## 2015-01-03 DIAGNOSIS — I10 Essential (primary) hypertension: Secondary | ICD-10-CM

## 2015-01-03 DIAGNOSIS — E11311 Type 2 diabetes mellitus with unspecified diabetic retinopathy with macular edema: Secondary | ICD-10-CM | POA: Diagnosis not present

## 2015-01-03 DIAGNOSIS — H35033 Hypertensive retinopathy, bilateral: Secondary | ICD-10-CM | POA: Diagnosis not present

## 2015-01-03 DIAGNOSIS — E113513 Type 2 diabetes mellitus with proliferative diabetic retinopathy with macular edema, bilateral: Secondary | ICD-10-CM | POA: Diagnosis not present

## 2015-01-03 DIAGNOSIS — H43813 Vitreous degeneration, bilateral: Secondary | ICD-10-CM

## 2015-02-14 ENCOUNTER — Encounter (INDEPENDENT_AMBULATORY_CARE_PROVIDER_SITE_OTHER): Payer: Medicare Other | Admitting: Ophthalmology

## 2015-02-14 DIAGNOSIS — E113513 Type 2 diabetes mellitus with proliferative diabetic retinopathy with macular edema, bilateral: Secondary | ICD-10-CM | POA: Diagnosis not present

## 2015-02-14 DIAGNOSIS — I10 Essential (primary) hypertension: Secondary | ICD-10-CM

## 2015-02-14 DIAGNOSIS — H35033 Hypertensive retinopathy, bilateral: Secondary | ICD-10-CM | POA: Diagnosis not present

## 2015-02-14 DIAGNOSIS — H43813 Vitreous degeneration, bilateral: Secondary | ICD-10-CM

## 2015-02-14 DIAGNOSIS — E11311 Type 2 diabetes mellitus with unspecified diabetic retinopathy with macular edema: Secondary | ICD-10-CM | POA: Diagnosis not present

## 2015-03-24 ENCOUNTER — Other Ambulatory Visit: Payer: Self-pay

## 2015-03-24 DIAGNOSIS — Z1231 Encounter for screening mammogram for malignant neoplasm of breast: Secondary | ICD-10-CM

## 2015-03-28 ENCOUNTER — Encounter (INDEPENDENT_AMBULATORY_CARE_PROVIDER_SITE_OTHER): Payer: Medicare Other | Admitting: Ophthalmology

## 2015-03-28 DIAGNOSIS — I10 Essential (primary) hypertension: Secondary | ICD-10-CM

## 2015-03-28 DIAGNOSIS — E113513 Type 2 diabetes mellitus with proliferative diabetic retinopathy with macular edema, bilateral: Secondary | ICD-10-CM | POA: Diagnosis not present

## 2015-03-28 DIAGNOSIS — E11311 Type 2 diabetes mellitus with unspecified diabetic retinopathy with macular edema: Secondary | ICD-10-CM | POA: Diagnosis not present

## 2015-03-28 DIAGNOSIS — H35033 Hypertensive retinopathy, bilateral: Secondary | ICD-10-CM

## 2015-03-28 DIAGNOSIS — H43813 Vitreous degeneration, bilateral: Secondary | ICD-10-CM | POA: Diagnosis not present

## 2015-05-06 ENCOUNTER — Ambulatory Visit: Payer: Medicare Other

## 2015-05-09 ENCOUNTER — Encounter (INDEPENDENT_AMBULATORY_CARE_PROVIDER_SITE_OTHER): Payer: Self-pay | Admitting: Ophthalmology

## 2015-05-27 ENCOUNTER — Ambulatory Visit: Payer: Self-pay

## 2015-06-02 ENCOUNTER — Encounter (INDEPENDENT_AMBULATORY_CARE_PROVIDER_SITE_OTHER): Payer: Medicare Other | Admitting: Ophthalmology

## 2015-06-23 ENCOUNTER — Encounter (INDEPENDENT_AMBULATORY_CARE_PROVIDER_SITE_OTHER): Payer: Medicare Other | Admitting: Ophthalmology

## 2015-06-23 DIAGNOSIS — H43813 Vitreous degeneration, bilateral: Secondary | ICD-10-CM

## 2015-06-23 DIAGNOSIS — E11311 Type 2 diabetes mellitus with unspecified diabetic retinopathy with macular edema: Secondary | ICD-10-CM | POA: Diagnosis not present

## 2015-06-23 DIAGNOSIS — H35033 Hypertensive retinopathy, bilateral: Secondary | ICD-10-CM | POA: Diagnosis not present

## 2015-06-23 DIAGNOSIS — E113513 Type 2 diabetes mellitus with proliferative diabetic retinopathy with macular edema, bilateral: Secondary | ICD-10-CM | POA: Diagnosis not present

## 2015-06-23 DIAGNOSIS — I10 Essential (primary) hypertension: Secondary | ICD-10-CM | POA: Diagnosis not present

## 2015-07-11 ENCOUNTER — Inpatient Hospital Stay (HOSPITAL_COMMUNITY)
Admission: EM | Admit: 2015-07-11 | Discharge: 2015-07-13 | DRG: 066 | Disposition: A | Discharge status: Home / Self Care | Payer: Medicare Other | Attending: Internal Medicine | Admitting: Internal Medicine

## 2015-07-11 ENCOUNTER — Emergency Department (HOSPITAL_COMMUNITY): Payer: Medicare Other

## 2015-07-11 ENCOUNTER — Encounter (HOSPITAL_COMMUNITY): Payer: Self-pay

## 2015-07-11 ENCOUNTER — Observation Stay (HOSPITAL_COMMUNITY): Payer: Medicare Other

## 2015-07-11 ENCOUNTER — Other Ambulatory Visit: Payer: Self-pay

## 2015-07-11 DIAGNOSIS — G459 Transient cerebral ischemic attack, unspecified: Secondary | ICD-10-CM | POA: Diagnosis not present

## 2015-07-11 DIAGNOSIS — E669 Obesity, unspecified: Secondary | ICD-10-CM | POA: Diagnosis present

## 2015-07-11 DIAGNOSIS — I129 Hypertensive chronic kidney disease with stage 1 through stage 4 chronic kidney disease, or unspecified chronic kidney disease: Secondary | ICD-10-CM | POA: Diagnosis present

## 2015-07-11 DIAGNOSIS — E1165 Type 2 diabetes mellitus with hyperglycemia: Secondary | ICD-10-CM

## 2015-07-11 DIAGNOSIS — I639 Cerebral infarction, unspecified: Secondary | ICD-10-CM | POA: Diagnosis not present

## 2015-07-11 DIAGNOSIS — J449 Chronic obstructive pulmonary disease, unspecified: Secondary | ICD-10-CM | POA: Diagnosis not present

## 2015-07-11 DIAGNOSIS — R471 Dysarthria and anarthria: Secondary | ICD-10-CM | POA: Diagnosis present

## 2015-07-11 DIAGNOSIS — I6381 Other cerebral infarction due to occlusion or stenosis of small artery: Secondary | ICD-10-CM

## 2015-07-11 DIAGNOSIS — I10 Essential (primary) hypertension: Secondary | ICD-10-CM

## 2015-07-11 DIAGNOSIS — Z794 Long term (current) use of insulin: Secondary | ICD-10-CM

## 2015-07-11 DIAGNOSIS — T502X5A Adverse effect of carbonic-anhydrase inhibitors, benzothiadiazides and other diuretics, initial encounter: Secondary | ICD-10-CM | POA: Diagnosis present

## 2015-07-11 DIAGNOSIS — E785 Hyperlipidemia, unspecified: Secondary | ICD-10-CM

## 2015-07-11 DIAGNOSIS — I119 Hypertensive heart disease without heart failure: Secondary | ICD-10-CM | POA: Diagnosis present

## 2015-07-11 DIAGNOSIS — Z9981 Dependence on supplemental oxygen: Secondary | ICD-10-CM

## 2015-07-11 DIAGNOSIS — J45909 Unspecified asthma, uncomplicated: Secondary | ICD-10-CM | POA: Diagnosis present

## 2015-07-11 DIAGNOSIS — E119 Type 2 diabetes mellitus without complications: Secondary | ICD-10-CM

## 2015-07-11 DIAGNOSIS — N183 Chronic kidney disease, stage 3 unspecified: Secondary | ICD-10-CM

## 2015-07-11 DIAGNOSIS — N182 Chronic kidney disease, stage 2 (mild): Secondary | ICD-10-CM | POA: Diagnosis not present

## 2015-07-11 DIAGNOSIS — Z6829 Body mass index (BMI) 29.0-29.9, adult: Secondary | ICD-10-CM

## 2015-07-11 DIAGNOSIS — I6359 Cerebral infarction due to unspecified occlusion or stenosis of other cerebral artery: Secondary | ICD-10-CM | POA: Diagnosis not present

## 2015-07-11 DIAGNOSIS — J438 Other emphysema: Secondary | ICD-10-CM

## 2015-07-11 DIAGNOSIS — I6623 Occlusion and stenosis of bilateral posterior cerebral arteries: Secondary | ICD-10-CM | POA: Diagnosis present

## 2015-07-11 DIAGNOSIS — E1122 Type 2 diabetes mellitus with diabetic chronic kidney disease: Secondary | ICD-10-CM | POA: Diagnosis present

## 2015-07-11 DIAGNOSIS — Z79899 Other long term (current) drug therapy: Secondary | ICD-10-CM

## 2015-07-11 HISTORY — DX: Chronic kidney disease, stage 2 (mild): N18.2

## 2015-07-11 LAB — APTT: aPTT: 27 seconds (ref 24–37)

## 2015-07-11 LAB — COMPREHENSIVE METABOLIC PANEL
ALK PHOS: 75 U/L (ref 38–126)
ALT: 22 U/L (ref 14–54)
ANION GAP: 8 (ref 5–15)
AST: 19 U/L (ref 15–41)
Albumin: 3.9 g/dL (ref 3.5–5.0)
BUN: 14 mg/dL (ref 6–20)
CALCIUM: 10 mg/dL (ref 8.9–10.3)
CO2: 27 mmol/L (ref 22–32)
Chloride: 103 mmol/L (ref 101–111)
Creatinine, Ser: 1.24 mg/dL — ABNORMAL HIGH (ref 0.44–1.00)
GFR calc non Af Amer: 40 mL/min — ABNORMAL LOW (ref >60)
GFR, EST AFRICAN AMERICAN: 46 mL/min — AB (ref >60)
Glucose, Bld: 182 mg/dL — ABNORMAL HIGH (ref 65–99)
Potassium: 3.6 mmol/L (ref 3.5–5.1)
SODIUM: 138 mmol/L (ref 135–145)
Total Bilirubin: 0.4 mg/dL (ref 0.3–1.2)
Total Protein: 7.6 g/dL (ref 6.5–8.1)

## 2015-07-11 LAB — I-STAT TROPONIN, ED: Troponin i, poc: 0.02 ng/mL (ref 0.00–0.08)

## 2015-07-11 LAB — PROTIME-INR
INR: 1.03 (ref 0.00–1.49)
PROTHROMBIN TIME: 13.7 s (ref 11.6–15.2)

## 2015-07-11 LAB — I-STAT CHEM 8, ED
BUN: 16 mg/dL (ref 6–20)
CALCIUM ION: 1.12 mmol/L — AB (ref 1.13–1.30)
Chloride: 101 mmol/L (ref 101–111)
Creatinine, Ser: 1.1 mg/dL — ABNORMAL HIGH (ref 0.44–1.00)
GLUCOSE: 188 mg/dL — AB (ref 65–99)
HCT: 48 % — ABNORMAL HIGH (ref 36.0–46.0)
Hemoglobin: 16.3 g/dL — ABNORMAL HIGH (ref 12.0–15.0)
Potassium: 3.6 mmol/L (ref 3.5–5.1)
SODIUM: 140 mmol/L (ref 135–145)
TCO2: 27 mmol/L (ref 0–100)

## 2015-07-11 LAB — DIFFERENTIAL
BASOS PCT: 0 %
Basophils Absolute: 0 10*3/uL (ref 0.0–0.1)
EOS PCT: 2 %
Eosinophils Absolute: 0.2 10*3/uL (ref 0.0–0.7)
LYMPHS PCT: 28 %
Lymphs Abs: 2.8 10*3/uL (ref 0.7–4.0)
MONO ABS: 0.6 10*3/uL (ref 0.1–1.0)
Monocytes Relative: 7 %
Neutro Abs: 6.1 10*3/uL (ref 1.7–7.7)
Neutrophils Relative %: 63 %

## 2015-07-11 LAB — CBC
HEMATOCRIT: 45.6 % (ref 36.0–46.0)
Hemoglobin: 15.3 g/dL — ABNORMAL HIGH (ref 12.0–15.0)
MCH: 27 pg (ref 26.0–34.0)
MCHC: 33.6 g/dL (ref 30.0–36.0)
MCV: 80.6 fL (ref 78.0–100.0)
PLATELETS: 202 10*3/uL (ref 150–400)
RBC: 5.66 MIL/uL — AB (ref 3.87–5.11)
RDW: 16.1 % — AB (ref 11.5–15.5)
WBC: 9.8 10*3/uL (ref 4.0–10.5)

## 2015-07-11 LAB — GLUCOSE, CAPILLARY: Glucose-Capillary: 102 mg/dL — ABNORMAL HIGH (ref 65–99)

## 2015-07-11 LAB — ETHANOL

## 2015-07-11 LAB — LIPID PANEL
CHOLESTEROL: 179 mg/dL (ref 0–200)
HDL: 44 mg/dL (ref >40)
LDL CALC: 103 mg/dL — AB (ref 0–99)
TRIGLYCERIDES: 161 mg/dL — AB (ref <150)
Total CHOL/HDL Ratio: 4.1 RATIO
VLDL: 32 mg/dL (ref 0–40)

## 2015-07-11 MED ORDER — SODIUM CHLORIDE 0.9 % IV SOLN: INTRAVENOUS | Status: AC

## 2015-07-11 MED ORDER — ALBUTEROL SULFATE (2.5 MG/3ML) 0.083% IN NEBU: 2.5000 mg | INHALATION_SOLUTION | Freq: Four times a day (QID) | RESPIRATORY_TRACT | Status: DC | PRN

## 2015-07-11 MED ORDER — INSULIN ASPART 100 UNIT/ML ~~LOC~~ SOLN: 0.0000 [IU] | Freq: Every day | SUBCUTANEOUS | Status: DC

## 2015-07-11 MED ORDER — ALBUTEROL SULFATE HFA 108 (90 BASE) MCG/ACT IN AERS: 2.0000 | INHALATION_SPRAY | Freq: Four times a day (QID) | RESPIRATORY_TRACT | Status: DC | PRN

## 2015-07-11 MED ORDER — INSULIN ASPART 100 UNIT/ML ~~LOC~~ SOLN
0.0000 [IU] | Freq: Three times a day (TID) | SUBCUTANEOUS | Status: DC
  Administered 2015-07-12: 5 [IU] via SUBCUTANEOUS
  Administered 2015-07-12: 2 [IU] via SUBCUTANEOUS
  Administered 2015-07-12: 3 [IU] via SUBCUTANEOUS
  Administered 2015-07-13: 5 [IU] via SUBCUTANEOUS
  Administered 2015-07-13: 3 [IU] via SUBCUTANEOUS

## 2015-07-11 MED ORDER — PANTOPRAZOLE SODIUM 40 MG PO TBEC
40.0000 mg | DELAYED_RELEASE_TABLET | Freq: Every day | ORAL | Status: DC
  Administered 2015-07-12 – 2015-07-13 (×2): 40 mg via ORAL
  Filled 2015-07-11 (×2): qty 1

## 2015-07-11 MED ORDER — COLCHICINE 0.6 MG PO TABS: 0.6000 mg | ORAL_TABLET | Freq: Two times a day (BID) | ORAL | Status: DC | PRN

## 2015-07-11 MED ORDER — ASPIRIN 325 MG PO TABS
325.0000 mg | ORAL_TABLET | Freq: Every day | ORAL | Status: DC
  Administered 2015-07-12 – 2015-07-13 (×2): 325 mg via ORAL
  Filled 2015-07-11 (×2): qty 1

## 2015-07-11 MED ORDER — SIMVASTATIN 40 MG PO TABS: 40.0000 mg | ORAL_TABLET | Freq: Every day | ORAL | Status: DC

## 2015-07-11 MED ORDER — INSULIN GLARGINE 100 UNIT/ML ~~LOC~~ SOLN
15.0000 [IU] | Freq: Every day | SUBCUTANEOUS | Status: DC
  Administered 2015-07-11 – 2015-07-12 (×2): 15 [IU] via SUBCUTANEOUS
  Filled 2015-07-11 (×3): qty 0.15

## 2015-07-11 MED ORDER — SENNOSIDES-DOCUSATE SODIUM 8.6-50 MG PO TABS: 1.0000 | ORAL_TABLET | Freq: Every evening | ORAL | Status: DC | PRN

## 2015-07-11 MED ORDER — STROKE: EARLY STAGES OF RECOVERY BOOK
Freq: Once | Status: DC
  Filled 2015-07-11: qty 1

## 2015-07-11 MED ORDER — ENOXAPARIN SODIUM 40 MG/0.4ML ~~LOC~~ SOLN
40.0000 mg | SUBCUTANEOUS | Status: DC
  Administered 2015-07-11 – 2015-07-12 (×2): 40 mg via SUBCUTANEOUS
  Filled 2015-07-11 (×2): qty 0.4

## 2015-07-11 MED ORDER — TRAMADOL HCL 50 MG PO TABS: 50.0000 mg | ORAL_TABLET | Freq: Two times a day (BID) | ORAL | Status: DC | PRN

## 2015-07-11 NOTE — ED Notes (Signed)
Per EMS: LSN = 1110 today. Family states some R sided weakness and slurred speech. Slurred speech noted on arrival. Neuro MD at bedside. Hx: TIA.

## 2015-07-11 NOTE — H&P (Signed)
History and Physical    Paula West [id number] DOB: April 24, 1932 DOA: 07/11/2015  PCP: Paula West Patient coming from: home  Chief Complaint: slurred speech facial droop  HPI: Paula West is a very pleasant 82 y.o. female with medical history significant for dm, hypertension, COPD, hyperlipidemia presents emergency department from home with the chief complaint of slurred speech and facial droop. Initial evaluation concerning for TIA/stroke.  Formation is obtained from the patient and the daughter who is at the bedside. Agent and daughter live together. Daughter reports patient got up this morning around 8 AM it was "fine" until 22 and she noticed that "her face didn't look right". She was having trouble talking according to the daughter". Daughter reports "she couldn't get her words out". She denies headache visual disturbances numbness tingling of extremities. He denies any difficulties chewing or swallowing. She denies any gait disturbances lower extremity weakness. She denies chest pain palpitation shortness of breath. Denies abdominal pain nausea vomiting diarrhea constipation. He denies dysuria hematuria frequency or urgency. She checks her capillary blood sugar twice a day this morning it was 175.    ED Course: In the emergency department stroke team called to the bedside. CT is negative. Was evaluated by neurology who opined symptoms to mild to treat and patient was outside the window for TPA and recommends further workup and admission.  Review of Systems: As per HPI otherwise 10 point review of systems negative.   Ambulatory Status: cane/walker  Past Medical History  Diagnosis Date  . Asthma   . COPD (chronic obstructive pulmonary disease) ([place])   . Hypertension   . Hyperlipidemia   . Diabetes mellitus   . CKD (chronic kidney disease), stage II     History reviewed. No pertinent past surgical history.  Social History   Social History  . Marital Status: Widowed     Spouse Name: N/A  . Number of Children: N/A  . Years of Education: N/A   Occupational History  . Not on file.   Social History Main Topics  . Smoking status: Not on file  . Smokeless tobacco: Not on file  . Alcohol Use: Not on file  . Drug Use: Not on file  . Sexual Activity: Not on file   Other Topics Concern  . Not on file   Social History Narrative    No Known Allergies  History reviewed. No pertinent family history.  Prior to Admission medications   Medication Sig Start Date End Date Taking? Authorizing Provider  albuterol (PROAIR HFA) 108 (90 Base) MCG/ACT inhaler Inhale 2 puffs into the lungs every 6 (six) hours as needed for wheezing or shortness of breath.   Yes Historical Provider, MD  colchicine (COLCRYS) 0.6 MG tablet Take 0.6 mg by mouth 2 (two) times daily as needed (for gout symptoms).   Yes Historical Provider, MD  gabapentin (NEURONTIN) 300 MG capsule Take 300 mg by mouth daily.   Yes Historical Provider, MD  insulin glargine (LANTUS) 100 UNIT/ML injection Inject 50-60 Units into the skin 2 (two) times daily. 60 units in the morning and 50 units at bedtime   Yes Historical Provider, MD  linagliptin (TRADJENTA) 5 MG TABS tablet Take 5 mg by mouth daily.   Yes Historical Provider, MD  olmesartan-hydrochlorothiazide (BENICAR HCT) 40-25 MG per tablet Take 1 tablet by mouth daily.     Yes Historical Provider, MD  omeprazole (PRILOSEC) 20 MG capsule Take 20 mg by mouth daily as needed (for heartburn).    Yes Historical  Provider, MD  simvastatin (ZOCOR) 40 MG tablet Take 40 mg by mouth at bedtime.     Yes Historical Provider, MD  traMADol (ULTRAM) 50 MG tablet Take 50 mg by mouth 2 (two) times daily as needed for moderate pain.   Yes Historical Provider, MD    Physical Exam: Filed Vitals:   07/11/15 1519 07/11/15 1545 07/11/15 1600  BP: 179/91 171/82 165/77  Pulse: 79 63 74  Resp: 22 15 20   SpO2: 94% 100% 95%     General:  Appears calm and comfortable, very  hard of hearing no acute distress Eyes:  PERRL, EOMI, normal lids, iris ENT:  grossly normal hearing, lips & tongue, mucous membranes of her mouth are pink and moist Neck:  no LAD, masses or thyromegaly Cardiovascular:  RRR, no m/r/g. No LE edema. Pulses present and palpable Respiratory:  CTA bilaterally, no w/r/r. Normal respiratory effort. Abdomen:  soft, ntnd, obese positive bowel sounds Skin:  no rash or induration seen on limited exam Musculoskeletal:  grossly normal tone BUE/BLE, good ROM, no bony abnormality, ointments without swelling/erythema Psychiatric:  grossly normal mood and affect, speech fluent and appropriate, AOx3 Neurologic:  Alert and oriented slow and somewhat slurred. Tongue midline finger-nose normal bilateral grip right 4 out of 5 left 5 out of 5 lower extremity strength 5 out of 5 bilaterally  Labs on Admission: I have personally reviewed following labs and imaging studies  CBC:  Recent Labs Lab 07/11/15 1503 07/11/15 1510  WBC 9.8  --   NEUTROABS 6.1  --   HGB 15.3* 16.3*  HCT 45.6 48.0*  MCV 80.6  --   PLT 202  --    Basic Metabolic Panel:  Recent Labs Lab 07/11/15 1503 07/11/15 1510  NA 138 140  K 3.6 3.6  CL 103 101  CO2 27  --   GLUCOSE 182* 188*  BUN 14 16  CREATININE 1.24* 1.10*  CALCIUM 10.0  --    GFR: CrCl cannot be calculated (Unknown ideal weight.). Liver Function Tests:  Recent Labs Lab 07/11/15 1503  AST 19  ALT 22  ALKPHOS 75  BILITOT 0.4  PROT 7.6  ALBUMIN 3.9   No results for input(s): LIPASE, AMYLASE in the last 168 hours. No results for input(s): AMMONIA in the last 168 hours. Coagulation Profile:  Recent Labs Lab 07/11/15 1503  INR 1.03   Cardiac Enzymes: No results for input(s): CKTOTAL, CKMB, CKMBINDEX, TROPONINI in the last 168 hours. BNP (last 3 results) No results for input(s): PROBNP in the last 8760 hours. HbA1C: No results for input(s): HGBA1C in the last 72 hours. CBG: No results for  input(s): GLUCAP in the last 168 hours. Lipid Profile: No results for input(s): CHOL, HDL, LDLCALC, TRIG, CHOLHDL, LDLDIRECT in the last 72 hours. Thyroid Function Tests: No results for input(s): TSH, T4TOTAL, FREET4, T3FREE, THYROIDAB in the last 72 hours. Anemia Panel: No results for input(s): VITAMINB12, FOLATE, FERRITIN, TIBC, IRON, RETICCTPCT in the last 72 hours. Urine analysis:    Component Value Date/Time   COLORURINE YELLOW 06/22/2010 1226   APPEARANCEUR CLEAR 06/22/2010 1226   LABSPEC 1.012 06/22/2010 1226   PHURINE 6.0 06/22/2010 1226   GLUCOSEU 250* 06/22/2010 1226   HGBUR NEGATIVE 06/22/2010 1226   BILIRUBINUR NEGATIVE 06/22/2010 1226   KETONESUR NEGATIVE 06/22/2010 1226   PROTEINUR NEGATIVE 06/22/2010 1226   UROBILINOGEN 0.2 06/22/2010 1226   NITRITE NEGATIVE 06/22/2010 1226   LEUKOCYTESUR  06/22/2010 1226    NEGATIVE MICROSCOPIC NOT DONE ON URINES  WITH NEGATIVE PROTEIN, BLOOD, LEUKOCYTES, NITRITE, OR GLUCOSE <1000 mg/dL.    Creatinine Clearance: CrCl cannot be calculated (Unknown ideal weight.).  Sepsis Labs: @LABRCNTIP (procalcitonin:4,lacticidven:4) )No results found for this or any previous visit (from the past 240 hour(s)).   Radiological Exams on Admission: Ct Head Wo Contrast  07/11/2015  CLINICAL DATA:  Stroke.  Right-sided weakness and slurred speech. EXAM: CT HEAD WITHOUT CONTRAST TECHNIQUE: Contiguous axial images were obtained from the base of the skull through the vertex without intravenous contrast. COMPARISON:  CT 06/22/2010, MRI 06/24/2010 FINDINGS: Moderate ventricular enlargement is slightly more prominent compared with 2012. Biventricular distance 49.6 mm today, 47.8 mm previously. There is dilatation of the temporal horns with rounded frontal horns. Third ventricle also rounded. Findings suggest hydrocephalus. Periventricular low-density also has progressed and may be due to transependymal resorption of CSF as well as some chronic microvascular  ischemia. 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 No acute infarct.  No acute hemorrhage or mass lesion. Calvarium intact. IMPRESSION: Progressive ventricular enlargement suggestive of hydrocephalus. This is greater than expected for the degree of atrophy and there is effacement of the sulci suggesting hydrocephalus. There may be periventricular resorption of CSF due to hydrocephalus No acute infarct These results were called by telephone at the time of interpretation on 07/11/2015 at 3:27 pm to Dr. Roland Rack , who verbally acknowledged these results. Electronically Signed   By: Franchot Gallo M.D.   On: 07/11/2015 15:28    EKG: Independently reviewed. Sinus rhythm Left anterior fascicular block Anterior infarct, old Baseline wander in lead(s) I III aVR aVL aVF  Assessment/Plan Principal Problem:   TIA (transient ischemic attack) Active Problems:   Asthma   COPD (chronic obstructive pulmonary disease) ([facility])   Hypertension   Hyperlipidemia   Diabetes ([place])   CKD (chronic kidney disease), stage II    #1. TIA. Symptoms much improved on admission. Risk factors include hypertension, diabetes, obesity, hyperlipidemia. Evaluated by Dr. Leonel Ramsay in the emergency department who opines she likely had a stroke recommended admission for workup -Admit to telemetry -Obtain MRI/MRA of the brain without contrast -Obtain a 2-D echo and carotid Dopplers -Check lipid panel and hemoglobin A1c -Continue aspirin -Continue statin -Physical therapy/occupational therapy  #2. Hypertension. Fair control in the emergency department. Home medications include Benicar HCT. -We will hold this for now -Monitor blood pressure -Anticipate resuming when appropriate  #3. Diabetes, serum glucose 188 on admission. Home medications include Lantus twice a day. -We will obtain hemoglobin A1c -We will provide Lantus at lower dose as appetite unreliable -Sliding scale insulin  4. COPD. Not on home  oxygen. Appears stable at baseline. Await chest x-ray oxygen saturation level greater than 90% on room air -Continue home nebulizers as needed -Monitor.   #5. Hyperlipidemia. -Obtain a lipid panel -Continue statin  6. Chronic kidney disease stage II. Creatinine 1.10 on admission. This appears to be close to baseline. -Hold nephrotoxins -Monitor urine output -Recheck in the morning   DVT prophylaxis: lovenox Code Status: full  Family Communication: daughter at bedside  Disposition Plan: home with daughter  Consults called: dr kirpatrick neuro  Admission status: obs    Radene Gunning MD [facility]  If 7PM-7AM, please contact night-coverage [email or web address] Password [facility]  07/11/2015, 4:14 PM

## 2015-07-11 NOTE — ED Provider Notes (Signed)
CSN: [id number]     Arrival date & time 07/11/15  1458 History   First MD Initiated Contact with Patient 07/11/15 1516     Chief Complaint  Patient presents with  . Code Stroke    An emergency department physician performed an initial assessment on this suspected stroke patient at 1500. (Consider location/radiation/quality/duration/timing/severity/associated sxs/prior Treatment) HPI Comments: Patient is an 82 year old female with past medical history of diabetes, hypertension, and high cholesterol. She presents for evaluation of strokelike symptoms. The family states that they saw her at approximately 11:00 today and she seemed fine. This afternoon she was experiencing right-sided weakness, right-sided facial droop, and slurred speech. She was then brought here by EMS for evaluation of this. The patient denies any headache, difficulty breathing, or chest pain. She denies prior history of stroke. A code stroke was called in the field and this was continued at the emergency department.  The history is provided by the patient.    Past Medical History  Diagnosis Date  . Asthma   . COPD (chronic obstructive pulmonary disease) ([place])   . Hypertension   . Hyperlipidemia   . Diabetes mellitus    History reviewed. No pertinent past surgical history. History reviewed. No pertinent family history. Social History  Substance Use Topics  . Smoking status: None  . Smokeless tobacco: None  . Alcohol Use: None   OB History    No data available     Review of Systems  All other systems reviewed and are negative.     Allergies  Review of patient's allergies indicates no known allergies.  Home Medications   Prior to Admission medications   Medication Sig Start Date End Date Taking? Authorizing Provider  omeprazole (PRILOSEC) 20 MG capsule Take 20 mg by mouth daily.     Yes Historical Provider, MD  simvastatin (ZOCOR) 40 MG tablet Take 40 mg by mouth at bedtime.     Yes Historical Provider,  MD  hydrocodone-acetaminophen (LORCET-HD) 5-500 MG per capsule Take 1 capsule by mouth every 6 (six) hours as needed.      Historical Provider, MD  insulin glargine (LANTUS) 100 UNIT/ML injection Inject 70 Units into the skin every morning.      Historical Provider, MD  insulin glargine (LANTUS) 100 UNIT/ML injection Inject 50 Units into the skin at bedtime.      Historical Provider, MD  olmesartan-hydrochlorothiazide (BENICAR HCT) 40-25 MG per tablet Take 1 tablet by mouth daily.      Historical Provider, MD   BP 179/91 mmHg  Pulse 79  Resp 22  SpO2 94% Physical Exam  Constitutional: She is oriented to person, place, and time. She appears well-developed and well-nourished. No distress.  HENT:  Head: Normocephalic and atraumatic.  Neck: Normal range of motion. Neck supple.  Cardiovascular: Normal rate and regular rhythm.  Exam reveals no gallop and no friction rub.   No murmur heard. Pulmonary/Chest: Effort normal and breath sounds normal. No respiratory distress. She has no wheezes.  Abdominal: Soft. Bowel sounds are normal. She exhibits no distension. There is no tenderness.  Musculoskeletal: Normal range of motion.  Neurological: She is alert and oriented to person, place, and time. No cranial nerve deficit. She exhibits abnormal muscle tone. Coordination normal.  There is a slight right-sided facial droop noted.  Strength is 4+ out of 5 in the right upper and right lower extremity.  Skin: Skin is warm and dry. She is not diaphoretic.  Nursing note and vitals reviewed.   ED  Course  Procedures (including critical care time) Labs Review Labs Reviewed  CBC - Abnormal; Notable for the following:    RBC 5.66 (*)    Hemoglobin 15.3 (*)    RDW 16.1 (*)    All other components within normal limits  COMPREHENSIVE METABOLIC PANEL - Abnormal; Notable for the following:    Glucose, Bld 182 (*)    Creatinine, Ser 1.24 (*)    GFR calc non Af Amer 40 (*)    GFR calc Af Amer 46 (*)     All other components within normal limits  I-STAT CHEM 8, ED - Abnormal; Notable for the following:    Creatinine, Ser 1.10 (*)    Glucose, Bld 188 (*)    Calcium, Ion 1.12 (*)    Hemoglobin 16.3 (*)    HCT 48.0 (*)    All other components within normal limits  ETHANOL  PROTIME-INR  APTT  DIFFERENTIAL  URINE RAPID DRUG SCREEN, HOSP PERFORMED  URINALYSIS, ROUTINE W REFLEX MICROSCOPIC (NOT AT [facility])  I-STAT TROPOININ, ED    Imaging Review Ct Head Wo Contrast  07/11/2015  CLINICAL DATA:  Stroke.  Right-sided weakness and slurred speech. EXAM: CT HEAD WITHOUT CONTRAST TECHNIQUE: Contiguous axial images were obtained from the base of the skull through the vertex without intravenous contrast. COMPARISON:  CT 06/22/2010, MRI 06/24/2010 FINDINGS: Moderate ventricular enlargement is slightly more prominent compared with 2012. Biventricular distance 49.6 mm today, 47.8 mm previously. There is dilatation of the temporal horns with rounded frontal horns. Third ventricle also rounded. Findings suggest hydrocephalus. Periventricular low-density also has progressed and may be due to transependymal resorption of CSF as well as some chronic microvascular ischemia. 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 No acute infarct.  No acute hemorrhage or mass lesion. Calvarium intact. IMPRESSION: Progressive ventricular enlargement suggestive of hydrocephalus. This is greater than expected for the degree of atrophy and there is effacement of the sulci suggesting hydrocephalus. There may be periventricular resorption of CSF due to hydrocephalus No acute infarct These results were called by telephone at the time of interpretation on 07/11/2015 at 3:27 pm to Dr. Roland Rack , who verbally acknowledged these results. Electronically Signed   By: Franchot Gallo M.D.   On: 07/11/2015 15:28   I have personally reviewed and evaluated these images and lab results as part of my medical decision-making.  ED ECG  REPORT   Date: 07/13/2015  Rate: 68  Rhythm: normal sinus rhythm  QRS Axis: left  Intervals: normal  ST/T Wave abnormalities: normal  Conduction Disutrbances:none  Narrative Interpretation:   Old EKG Reviewed: unchanged  I have personally reviewed the EKG tracing and agree with the computerized printout as noted.   MDM   Final diagnoses:  Stroke (cerebrum) [facility])    Patient presents with stroke-like symptoms.  Arrived as a code stroke.  Head CT clear.  Patient evaluated by Dr. Leonel Ramsay and felt not to be a candidate for tpa.  Will admit to medicine for stroke workup.    Veryl Speak, MD 07/13/15 [phone]

## 2015-07-11 NOTE — Consult Note (Signed)
Requesting Physician: ED MD    Chief Complaint: Code stroke  History obtained from:  Patient     HPI:                                                                                                                                         Paula West is an 82 y.o. female who was at home with family when she was noted to have a right facial droop and left facial movement with talking, right sideed weakness and speech abnormality. LSN was 11:10.  She was brought to [facility] as a code stroke. At time of arrival she had no right sided weakness mild right facial asymmetry when smiling and no speech abnormalities. She was brought to CT. CT showed no acute changes. Per family she has improved but continues to have dysarthria. At baseline she uses a walker and scoots around in a wheel chair. Given her improvement tPA was not given.   Date last known well: Today Time last known well: Time: 11:10 tPA Given: No: minimal symptoms   Past Medical History  Diagnosis Date  . Asthma   . COPD (chronic obstructive pulmonary disease)   . Hypertension   . Hyperlipidemia   . Diabetes mellitus     No past surgical history on file.  No family history on file. Social History:  has no tobacco, alcohol, and drug history on file.  Allergies: No Known Allergies  Medications:                                                                                                                           No current facility-administered medications for this encounter.   Current Outpatient Prescriptions  Medication Sig Dispense Refill  . hydrocodone-acetaminophen (LORCET-HD) 5-500 MG per capsule Take 1 capsule by mouth every 6 (six) hours as needed.      . insulin glargine (LANTUS) 100 UNIT/ML injection Inject 70 Units into the skin every morning.      . insulin glargine (LANTUS) 100 UNIT/ML injection Inject 50 Units into the skin at bedtime.      Marland Kitchen olmesartan-hydrochlorothiazide (BENICAR HCT) 40-25 MG per tablet Take  1 tablet by mouth daily.      Marland Kitchen omeprazole (PRILOSEC) 20 MG capsule Take 20 mg by mouth daily.      . simvastatin (ZOCOR) 40 MG tablet Take 40 mg  by mouth at bedtime.         ROS:                                                                                                                                       History obtained from the patient  General ROS: negative for - chills, fatigue, fever, night sweats, weight gain or weight loss Psychological ROS: negative for - behavioral disorder, hallucinations, memory difficulties, mood swings or suicidal ideation Ophthalmic ROS: negative for - blurry vision, double vision, eye pain or loss of vision ENT ROS: negative for - epistaxis, nasal discharge, oral lesions, sore throat, tinnitus or vertigo Allergy and Immunology ROS: negative for - hives or itchy/watery eyes Hematological and Lymphatic ROS: negative for - bleeding problems, bruising or swollen lymph nodes Endocrine ROS: negative for - galactorrhea, hair pattern changes, polydipsia/polyuria or temperature intolerance Respiratory ROS: negative for - cough, hemoptysis, shortness of breath or wheezing Cardiovascular ROS: negative for - chest pain, dyspnea on exertion, edema or irregular heartbeat Gastrointestinal ROS: negative for - abdominal pain, diarrhea, hematemesis, nausea/vomiting or stool incontinence Genito-Urinary ROS: negative for - dysuria, hematuria, incontinence or urinary frequency/urgency Musculoskeletal ROS: negative for - joint swelling or muscular weakness Neurological ROS: as noted in HPI Dermatological ROS: negative for rash and skin lesion changes  Neurologic Examination:                                                                                                      There were no vitals taken for this visit.  HEENT-  Normocephalic, no lesions, without obvious abnormality.  Normal external eye and conjunctiva.  Normal TM's bilaterally.  Normal auditory canals and  external ears. Normal external nose, mucus membranes and septum.  Normal pharynx. Cardiovascular- S1, S2 normal, pulses palpable throughout   Lungs- chest clear, no wheezing, rales, normal symmetric air entry Abdomen- normal findings: bowel sounds normal Extremities- no edema Lymph-no adenopathy palpable Musculoskeletal-no joint tenderness, deformity or swelling Skin-warm and dry, no hyperpigmentation, vitiligo, or suspicious lesions  Neurological Examination Mental Status: Alert, oriented.  Speech dysarthric but she is adentulous  without evidence of aphasia.  Able to follow simple commands without difficulty. Cranial Nerves: II: Visual fields grossly normal, pupils equal, round, reactive to light and accommodation III,IV, VI: ptosis not present, extra-ocular motions intact bilaterally V,VII: smile slightly asymmetric on the right, facial light touch sensation normal bilaterally VIII: hearing normal bilaterally IX,X: uvula rises symmetrically XI: bilateral shoulder shrug XII: midline tongue  extension Motor: Right : Upper extremity   5/5    Left:     Upper extremity   5/5  Lower extremity   4/5     Lower extremity   4/5 --slight pronator drift on the right --when asked to elevate legs she could not hold them antigravity for more than a few seconds.  Tone and bulk:normal tone throughout; no atrophy noted Sensory: Pinprick and light touch intact throughout, bilaterally Deep Tendon Reflexes: 1+ and symmetric throughout Plantars: Right: downgoing   Left: downgoing Cerebellar: normal finger-to-nose,and normal heel-to-shin test for strength Gait: not tested       Lab Results: Basic Metabolic Panel: No results for input(s): NA, K, CL, CO2, GLUCOSE, BUN, CREATININE, CALCIUM, MG, PHOS in the last 168 hours.  Liver Function Tests: No results for input(s): AST, ALT, ALKPHOS, BILITOT, PROT, ALBUMIN in the last 168 hours. No results for input(s): LIPASE, AMYLASE in the last 168  hours. No results for input(s): AMMONIA in the last 168 hours.  CBC: No results for input(s): WBC, NEUTROABS, HGB, HCT, MCV, PLT in the last 168 hours.  Cardiac Enzymes: No results for input(s): CKTOTAL, CKMB, CKMBINDEX, TROPONINI in the last 168 hours.  Lipid Panel: No results for input(s): CHOL, TRIG, HDL, CHOLHDL, VLDL, LDLCALC in the last 168 hours.  CBG: No results for input(s): GLUCAP in the last 168 hours.  Microbiology: Results for orders placed or performed during the hospital encounter of 06/22/10  Urine culture     Status: None   Collection Time: 06/22/10 12:26 PM  Result Value Ref Range Status   Specimen Description URINE, CLEAN CATCH  Final   Special Requests ADDED [id number] 06/23/10  Final   Culture  Setup Time [id number]  Final   Colony Count 15,000 COLONIES/ML  Final   Culture   Final    Multiple bacterial morphotypes present, none predominant. Suggest appropriate recollection if clinically indicated.   Report Status 06/24/2010 FINAL  Final    Coagulation Studies: No results for input(s): LABPROT, INR in the last 72 hours.  Imaging: No results found.     Assessment and plan discussed with with attending physician and they are in agreement.    Etta Quill PA-C [facility] [phone]  07/11/2015, 3:04 PM   She is poorly cooperative with LE exam, but is at least 4/5 bilatereally.    Assessment: 82 y.o. female with new onset slurred speech, right pronator drift and mild right facial weakness. I suspect that she has had a stroke. Her symptoms are rapidly improving at this time.   Stroke Risk Factors - diabetes mellitus, hyperlipidemia and hypertension  1. HgbA1c, fasting lipid panel 2. MRI, MRA  of the brain without contrast 3. Frequent neuro checks 4. Echocardiogram 5. Carotid dopplers 6. Prophylactic therapy-Antiplatelet med: Aspirin - dose 325mg  PO or 300mg  PR 7. Risk factor modification 8. Telemetry monitoring 9. PT consult, OT  consult, Speech consult 10. please page stroke NP  Or  PA  Or MD   from 8am -4 pm starting 6/24 as this patient will be followed by the stroke team at this point.   You can look them up on [email or web address]    Roland Rack, MD [facility] [phone]  If 7pm- 7am, please page neurology on call as listed in [place].

## 2015-07-11 NOTE — ED Notes (Signed)
Patient taken to MRI. Radiology will take patient to [facility] bed 19 after

## 2015-07-11 NOTE — Code Documentation (Signed)
82yo female arriving to [facility] via [place] at 27.  Patient from home where she lives with her daughter.  LKW 1110.  EMS reports patient with facial droop, slurred speech and right sided weakness.  Stroke team to the bedside.  Labs drawn and patient cleared for CT by Dr. Lita Mains.  Patient to CT with team.  CT completed.  Patient to E40.  NIHSS 7 initially, but reassessed and now 4, see documentation for details and code stroke times.  Patient with dysarthria and bilateral leg weakness.  Patient's daughter at the bedside and reports she was with patient when she suddenly had garbled speech with mouth twisting to the right and drooling.  Patient with leg weakness at baseline and uses an assistive device.  Dr. Leonel Ramsay at the bedside.  Patient is too mild to treat at this time with tPA and patient is outside the window at 1540.  Bedside handoff with ED RN Chester Holstein.

## 2015-07-12 ENCOUNTER — Observation Stay (HOSPITAL_BASED_OUTPATIENT_CLINIC_OR_DEPARTMENT_OTHER): Payer: Medicare Other

## 2015-07-12 ENCOUNTER — Encounter (HOSPITAL_COMMUNITY): Payer: Self-pay

## 2015-07-12 ENCOUNTER — Observation Stay (HOSPITAL_COMMUNITY): Payer: Medicare Other

## 2015-07-12 DIAGNOSIS — I639 Cerebral infarction, unspecified: Secondary | ICD-10-CM

## 2015-07-12 DIAGNOSIS — R471 Dysarthria and anarthria: Secondary | ICD-10-CM | POA: Diagnosis present

## 2015-07-12 DIAGNOSIS — T502X5A Adverse effect of carbonic-anhydrase inhibitors, benzothiadiazides and other diuretics, initial encounter: Secondary | ICD-10-CM | POA: Diagnosis present

## 2015-07-12 DIAGNOSIS — J449 Chronic obstructive pulmonary disease, unspecified: Secondary | ICD-10-CM | POA: Diagnosis not present

## 2015-07-12 DIAGNOSIS — Z9981 Dependence on supplemental oxygen: Secondary | ICD-10-CM | POA: Diagnosis not present

## 2015-07-12 DIAGNOSIS — G459 Transient cerebral ischemic attack, unspecified: Secondary | ICD-10-CM | POA: Diagnosis present

## 2015-07-12 DIAGNOSIS — E1165 Type 2 diabetes mellitus with hyperglycemia: Secondary | ICD-10-CM

## 2015-07-12 DIAGNOSIS — I129 Hypertensive chronic kidney disease with stage 1 through stage 4 chronic kidney disease, or unspecified chronic kidney disease: Secondary | ICD-10-CM | POA: Diagnosis present

## 2015-07-12 DIAGNOSIS — N183 Chronic kidney disease, stage 3 unspecified: Secondary | ICD-10-CM

## 2015-07-12 DIAGNOSIS — I6623 Occlusion and stenosis of bilateral posterior cerebral arteries: Secondary | ICD-10-CM | POA: Diagnosis present

## 2015-07-12 DIAGNOSIS — E1122 Type 2 diabetes mellitus with diabetic chronic kidney disease: Secondary | ICD-10-CM | POA: Diagnosis present

## 2015-07-12 DIAGNOSIS — E669 Obesity, unspecified: Secondary | ICD-10-CM | POA: Diagnosis present

## 2015-07-12 DIAGNOSIS — Z6829 Body mass index (BMI) 29.0-29.9, adult: Secondary | ICD-10-CM | POA: Diagnosis not present

## 2015-07-12 DIAGNOSIS — I10 Essential (primary) hypertension: Secondary | ICD-10-CM | POA: Diagnosis not present

## 2015-07-12 DIAGNOSIS — E785 Hyperlipidemia, unspecified: Secondary | ICD-10-CM

## 2015-07-12 DIAGNOSIS — Z794 Long term (current) use of insulin: Secondary | ICD-10-CM | POA: Diagnosis not present

## 2015-07-12 DIAGNOSIS — Z79899 Other long term (current) drug therapy: Secondary | ICD-10-CM | POA: Diagnosis not present

## 2015-07-12 DIAGNOSIS — I6381 Other cerebral infarction due to occlusion or stenosis of small artery: Secondary | ICD-10-CM | POA: Insufficient documentation

## 2015-07-12 LAB — GLUCOSE, CAPILLARY
GLUCOSE-CAPILLARY: 146 mg/dL — AB (ref 65–99)
GLUCOSE-CAPILLARY: 135 mg/dL — AB (ref 65–99)
Glucose-Capillary: 205 mg/dL — ABNORMAL HIGH (ref 65–99)

## 2015-07-12 LAB — URINALYSIS, ROUTINE W REFLEX MICROSCOPIC
BILIRUBIN URINE: NEGATIVE
GLUCOSE, UA: NEGATIVE mg/dL
HGB URINE DIPSTICK: NEGATIVE
KETONES UR: NEGATIVE mg/dL
Leukocytes, UA: NEGATIVE
Nitrite: NEGATIVE
PROTEIN: 30 mg/dL — AB
Specific Gravity, Urine: 1.01 (ref 1.005–1.030)
pH: 6.5 (ref 5.0–8.0)

## 2015-07-12 LAB — ECHOCARDIOGRAM COMPLETE
CHL CUP MV DEC (S): 246
E decel time: 246 msec
E/e' ratio: 4.4
FS: 37 % (ref 28–44)
IV/PV OW: 1.03
LA diam index: 1.61 cm/m2
LA vol A4C: 32.2 ml
LA vol: 36.2 mL
LASIZE: 30 mm
LAVOLIN: 19.5 mL/m2
LDCA: 3.14 cm2
LEFT ATRIUM END SYS DIAM: 30 mm
LV E/e' medial: 4.4
LV e' LATERAL: 8.05 cm/s
LVEEAVG: 4.4
LVOTD: 20 mm
MV pk E vel: 35.4 m/s
MVPKAVEL: 85.9 m/s
PW: 11.2 mm — AB (ref 0.6–1.1)
TDI e' lateral: 8.05
TDI e' medial: 3.15
Weight: 3056 oz

## 2015-07-12 LAB — CBC
HCT: 45.7 % (ref 36.0–46.0)
Hemoglobin: 14.8 g/dL (ref 12.0–15.0)
MCH: 25.8 pg — AB (ref 26.0–34.0)
MCHC: 32.4 g/dL (ref 30.0–36.0)
MCV: 79.6 fL (ref 78.0–100.0)
Platelets: 230 10*3/uL (ref 150–400)
RBC: 5.74 MIL/uL — ABNORMAL HIGH (ref 3.87–5.11)
RDW: 16 % — ABNORMAL HIGH (ref 11.5–15.5)
WBC: 10.1 10*3/uL (ref 4.0–10.5)

## 2015-07-12 LAB — BASIC METABOLIC PANEL
Anion gap: 12 (ref 5–15)
BUN: 14 mg/dL (ref 6–20)
CHLORIDE: 103 mmol/L (ref 101–111)
CO2: 24 mmol/L (ref 22–32)
CREATININE: 1.19 mg/dL — AB (ref 0.44–1.00)
Calcium: 10.5 mg/dL — ABNORMAL HIGH (ref 8.9–10.3)
GFR calc Af Amer: 49 mL/min — ABNORMAL LOW (ref >60)
GFR calc non Af Amer: 42 mL/min — ABNORMAL LOW (ref >60)
Glucose, Bld: 138 mg/dL — ABNORMAL HIGH (ref 65–99)
Potassium: 4 mmol/L (ref 3.5–5.1)
Sodium: 139 mmol/L (ref 135–145)

## 2015-07-12 LAB — HEMOGLOBIN A1C
Hgb A1c MFr Bld: 8.6 % — ABNORMAL HIGH (ref 4.8–5.6)
MEAN PLASMA GLUCOSE: 200 mg/dL

## 2015-07-12 LAB — URINE MICROSCOPIC-ADD ON
RBC / HPF: NONE SEEN RBC/hpf (ref 0–5)
WBC, UA: NONE SEEN WBC/hpf (ref 0–5)

## 2015-07-12 LAB — RAPID URINE DRUG SCREEN, HOSP PERFORMED
AMPHETAMINES: NOT DETECTED
BARBITURATES: NOT DETECTED
BENZODIAZEPINES: NOT DETECTED
COCAINE: NOT DETECTED
Opiates: NOT DETECTED
TETRAHYDROCANNABINOL: NOT DETECTED

## 2015-07-12 MED ORDER — PERFLUTREN LIPID MICROSPHERE
INTRAVENOUS | Status: AC
  Filled 2015-07-12: qty 10

## 2015-07-12 MED ORDER — HYDRALAZINE HCL 20 MG/ML IJ SOLN: 5.0000 mg | Freq: Four times a day (QID) | INTRAMUSCULAR | Status: DC | PRN

## 2015-07-12 MED ORDER — PERFLUTREN LIPID MICROSPHERE
1.0000 mL | INTRAVENOUS | Status: AC | PRN
  Administered 2015-07-12: 3 mL via INTRAVENOUS
  Filled 2015-07-12: qty 10

## 2015-07-12 MED ORDER — ASPIRIN 81 MG PO TABS: 81.0000 mg | ORAL_TABLET | Freq: Every day | ORAL | Status: DC

## 2015-07-12 MED ORDER — IRBESARTAN 300 MG PO TABS
300.0000 mg | ORAL_TABLET | Freq: Every day | ORAL | Status: DC
  Administered 2015-07-13: 300 mg via ORAL
  Filled 2015-07-12: qty 1

## 2015-07-12 MED ORDER — ATORVASTATIN CALCIUM 40 MG PO TABS
40.0000 mg | ORAL_TABLET | Freq: Every day | ORAL | Status: DC
  Administered 2015-07-12: 40 mg via ORAL
  Filled 2015-07-12: qty 1

## 2015-07-12 MED ORDER — SODIUM CHLORIDE 0.9 % IV SOLN
INTRAVENOUS | Status: AC
  Administered 2015-07-12: 11:00:00 via INTRAVENOUS
  Filled 2015-07-12: qty 1000

## 2015-07-12 MED ORDER — ATORVASTATIN CALCIUM 40 MG PO TABS: 40.0000 mg | ORAL_TABLET | Freq: Every day | ORAL | Status: DC

## 2015-07-12 NOTE — Progress Notes (Signed)
PROGRESS NOTE  Rettie Barreto [id number] DOB: 25-Jul-1932 DOA: 07/11/2015 PCP: No primary care provider on file.  Brief History:  82 year old female with a history of hypertension, diabetes mellitus, hyperlipidemia, CKD, and COPD presented with one-day history of slurred speech and facial droop. According to the patient's daughter, the patient's "face didn't look right". Around 11 AM on 07/11/2015. In addition, the patient "couldn't get her words out". As a result, the patient presented for further workup. Initial CT of the brain showed progressive ventricular enlargement, but negative for acute infarct. Neurology was consulted, and the patient was admitted for a full stroke workup.  Assessment/Plan: Acute left thalamic infarct -07/11/2015 MR brain--acute left thalamic infarct -07/11/2015 MRA brain--bilateral PCA stenosis unchanged; no major intracranial occlusions -Echocardiogram--pending -Carotid ultrasound--negative for hemodynamically significant stenosis -LDL 103 -07/11/2015 hemoglobin A1c--8.6 -PT/OT -Speech therapy -Allow for permissive hypertension and gradually normalize over next 5-7 days -Continue aspirin 325 mg daily  Hypertension -Hold olmesartan/HCTZ -Allow for permissive hypertension and gradually normalize over next 5-7 days  Hypercalcemia -Likely due to HCTZ--discontinue -corrected calcium approx 10.5 -Gentle IVF  Diabetes mellitus type 2 -Start reduced dose Lantus -NovoLog sliding scale  Hyperlipidemia -Discontinue Zocor 40 -Start Lipitor 40 mg daily  CKD stage III -Baseline creatinine 1.1-1.4  COPD -Presently stable on room air   Disposition Plan:   Home in 1-2 days  Family Communication:  No Family at bedside   Consultants:  Neurology  Code Status:  FULL  DVT Prophylaxis:  [place] Lovenox   Procedures: As Listed in Progress Note Above  Antibiotics: None    Subjective: Patient feels that her speech is a little bit better but  she is still having difficulty finding words and speaking clearly. Denies any fevers, chills, chest pain, shortness breath, headache, visual disturbance, focal extremity weakness, nausea, vomiting, diarrhea, abdominal pain, dysuria, hematuria, hematochezia, melena.   Objective: Filed Vitals:   07/12/15 0100 07/12/15 0300 07/12/15 0500 07/12/15 0903  BP: 146/85 134/97 152/106 153/91  Pulse: 74 78 74 75  Temp: 97.5 F (36.4 C) 98.4 F (36.9 C) 98.1 F (36.7 C) 98.5 F (36.9 C)  TempSrc: Oral Oral Oral Oral  Resp:  18 18 18   SpO2: 93% 94% 95% 98%    Intake/Output Summary (Last 24 hours) at 07/12/15 1042 Last data filed at 07/12/15 0900  Gross per 24 hour  Intake    480 ml  Output      0 ml  Net    480 ml   Weight change:  Exam:   General:  Pt is alert, follows commands appropriately, not in acute distress  HEENT: No icterus, No thrush, No neck mass, [place]/AT  Cardiovascular: RRR, S1/S2, no rubs, no gallops  Respiratory: CTA bilaterally, no wheezing, no crackles, no rhonchi  Abdomen: Soft/+BS, non tender, non distended, no guarding  Extremities: No edema, No lymphangitis, No petechiae, No rashes, no synovitis   Data Reviewed: I have personally reviewed following labs and imaging studies Basic Metabolic Panel:  Recent Labs Lab 07/11/15 1503 07/11/15 1510 07/12/15 0535  NA 138 140 139  K 3.6 3.6 4.0  CL 103 101 103  CO2 27  --  24  GLUCOSE 182* 188* 138*  BUN 14 16 14   CREATININE 1.24* 1.10* 1.19*  CALCIUM 10.0  --  10.5*   Liver Function Tests:  Recent Labs Lab 07/11/15 1503  AST 19  ALT 22  ALKPHOS 75  BILITOT 0.4  PROT 7.6  ALBUMIN  3.9   No results for input(s): LIPASE, AMYLASE in the last 168 hours. No results for input(s): AMMONIA in the last 168 hours. Coagulation Profile:  Recent Labs Lab 07/11/15 1503  INR 1.03   CBC:  Recent Labs Lab 07/11/15 1503 07/11/15 1510 07/11/15 2344  WBC 9.8  --  10.1  NEUTROABS 6.1  --   --   HGB 15.3*  16.3* 14.8  HCT 45.6 48.0* 45.7  MCV 80.6  --  79.6  PLT 202  --  230   Cardiac Enzymes: No results for input(s): CKTOTAL, CKMB, CKMBINDEX, TROPONINI in the last 168 hours. BNP: Invalid input(s): POCBNP CBG:  Recent Labs Lab 07/11/15 2253 07/12/15 0624  GLUCAP 102* 146*   HbA1C:  Recent Labs  07/11/15 1710  HGBA1C 8.6*   Urine analysis:    Component Value Date/Time   COLORURINE YELLOW 07/12/2015 0122   APPEARANCEUR CLEAR 07/12/2015 0122   LABSPEC 1.010 07/12/2015 0122   PHURINE 6.5 07/12/2015 0122   GLUCOSEU NEGATIVE 07/12/2015 0122   HGBUR NEGATIVE 07/12/2015 0122   BILIRUBINUR NEGATIVE 07/12/2015 0122   KETONESUR NEGATIVE 07/12/2015 0122   PROTEINUR 30* 07/12/2015 0122   UROBILINOGEN 0.2 06/22/2010 1226   NITRITE NEGATIVE 07/12/2015 0122   LEUKOCYTESUR NEGATIVE 07/12/2015 0122   Sepsis Labs: @LABRCNTIP (procalcitonin:4,lacticidven:4) )No results found for this or any previous visit (from the past 240 hour(s)).   Scheduled Meds: .  stroke: mapping our early stages of recovery book   Does not apply Once  . aspirin  325 mg Oral Daily  . enoxaparin (LOVENOX) injection  40 mg Subcutaneous Q24H  . insulin aspart  0-15 Units Subcutaneous TID WC  . insulin aspart  0-5 Units Subcutaneous QHS  . insulin glargine  15 Units Subcutaneous QHS  . pantoprazole  40 mg Oral Daily  . simvastatin  40 mg Oral QHS   Continuous Infusions:   Procedures/Studies: Ct Head Wo Contrast  07/11/2015  CLINICAL DATA:  Stroke.  Right-sided weakness and slurred speech. EXAM: CT HEAD WITHOUT CONTRAST TECHNIQUE: Contiguous axial images were obtained from the base of the skull through the vertex without intravenous contrast. COMPARISON:  CT 06/22/2010, MRI 06/24/2010 FINDINGS: Moderate ventricular enlargement is slightly more prominent compared with 2012. Biventricular distance 49.6 mm today, 47.8 mm previously. There is dilatation of the temporal horns with rounded frontal horns. Third  ventricle also rounded. Findings suggest hydrocephalus. Periventricular low-density also has progressed and may be due to transependymal resorption of CSF as well as some chronic microvascular ischemia. 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 No acute infarct.  No acute hemorrhage or mass lesion. Calvarium intact. IMPRESSION: Progressive ventricular enlargement suggestive of hydrocephalus. This is greater than expected for the degree of atrophy and there is effacement of the sulci suggesting hydrocephalus. There may be periventricular resorption of CSF due to hydrocephalus No acute infarct These results were called by telephone at the time of interpretation on 07/11/2015 at 3:27 pm to Dr. Roland Rack , who verbally acknowledged these results. Electronically Signed   By: Franchot Gallo M.D.   On: 07/11/2015 15:28   Mr Brain Wo Contrast  07/11/2015  CLINICAL DATA:  Right facial droop, right-sided weakness, and speech abnormality. EXAM: MRI HEAD WITHOUT CONTRAST MRA HEAD WITHOUT CONTRAST TECHNIQUE: Multiplanar, multiecho pulse sequences of the brain and surrounding structures were obtained without intravenous contrast. Angiographic images of the head were obtained using MRA technique without contrast. COMPARISON:  Head CT 07/11/2015.  Head MRI/MRA 06/24/2010 FINDINGS: MRI HEAD FINDINGS The study  is mildly motion degraded. Partially empty sella is unchanged. There is a 6 mm acute lacunar infarct involving the lateral aspect of the left thalamus. No mass, midline shift, or extra-axial fluid collection. There is moderate enlargement of the lateral and third ventricles which is relatively similar to the prior MRI and remains out of proportion to the degree of sulcal enlargement. No obstructing lesion is identified at the level of the cerebral aqueduct. Confluent periventricular white matter T2 hyperintensities have minimally increased from the prior MRI and are nonspecific but may reflect moderate  chronic small vessel ischemic disease, with transependymal CSF resorption considered less likely. There are chronic lacunar infarcts in the right thalamus and both cerebellar hemispheres, with the cerebellar infarcts new from the prior MRI and with chronic blood products associated with a chronic infarct in the superior left cerebellum. Prior bilateral cataract extraction is noted. Paranasal sinuses and mastoid air cells are clear. Major intracranial vascular flow voids are preserved. MRA HEAD FINDINGS The study is mildly motion degraded. The visualized distal vertebral arteries are patent to the basilar with the left being slightly larger than the right. Left PICA origin is patent. Right PICA is not well seen. SCA origins are patent with chronic narrowing and attenuation of the proximal left SCA. Basilar artery is patent without stenosis. There is a patent left posterior communicating artery. PCAs are patent with unchanged, severe origins stenosis on the left and moderate P2 stenosis on the right. The internal carotid arteries are patent from skullbase to carotid termini with mild narrowing of the right ICA terminus which appears new. The right ICA is slightly small in caliber diffusely due to absence of the right A1 segment. The left A1 segment is widely patent and supplies the right A2. There is a mildly bulbous appearance of the left MCA trifurcation region, with assessment limited by motion. Linear band of decreased signal through the distal left M1 segment in this region is favored to be artifactual due to motion. The right M1 segment and right MCA bifurcation are widely patent without evidence of major MCA branch occlusion bilaterally. IMPRESSION: 1. Acute left thalamic lacunar infarct. 2. Chronic, moderate ventriculomegaly which may reflect central predominant cerebral atrophy or hydrocephalus. Minimally increased periventricular white matter T2 changes from 2012 favored to reflect chronic small vessel  ischemia. 3. No major intracranial arterial occlusion. Atherosclerotic changes with new, mild right ICA terminus stenosis. 4. Unchanged bilateral PCA stenoses. Electronically Signed   By: Logan Bores M.D.   On: 07/11/2015 18:42   Mr Jodene Nam Head/brain Wo Cm  07/11/2015  CLINICAL DATA:  Right facial droop, right-sided weakness, and speech abnormality. EXAM: MRI HEAD WITHOUT CONTRAST MRA HEAD WITHOUT CONTRAST TECHNIQUE: Multiplanar, multiecho pulse sequences of the brain and surrounding structures were obtained without intravenous contrast. Angiographic images of the head were obtained using MRA technique without contrast. COMPARISON:  Head CT 07/11/2015.  Head MRI/MRA 06/24/2010 FINDINGS: MRI HEAD FINDINGS The study is mildly motion degraded. Partially empty sella is unchanged. There is a 6 mm acute lacunar infarct involving the lateral aspect of the left thalamus. No mass, midline shift, or extra-axial fluid collection. There is moderate enlargement of the lateral and third ventricles which is relatively similar to the prior MRI and remains out of proportion to the degree of sulcal enlargement. No obstructing lesion is identified at the level of the cerebral aqueduct. Confluent periventricular white matter T2 hyperintensities have minimally increased from the prior MRI and are nonspecific but may reflect moderate chronic small vessel  ischemic disease, with transependymal CSF resorption considered less likely. There are chronic lacunar infarcts in the right thalamus and both cerebellar hemispheres, with the cerebellar infarcts new from the prior MRI and with chronic blood products associated with a chronic infarct in the superior left cerebellum. Prior bilateral cataract extraction is noted. Paranasal sinuses and mastoid air cells are clear. Major intracranial vascular flow voids are preserved. MRA HEAD FINDINGS The study is mildly motion degraded. The visualized distal vertebral arteries are patent to the basilar  with the left being slightly larger than the right. Left PICA origin is patent. Right PICA is not well seen. SCA origins are patent with chronic narrowing and attenuation of the proximal left SCA. Basilar artery is patent without stenosis. There is a patent left posterior communicating artery. PCAs are patent with unchanged, severe origins stenosis on the left and moderate P2 stenosis on the right. The internal carotid arteries are patent from skullbase to carotid termini with mild narrowing of the right ICA terminus which appears new. The right ICA is slightly small in caliber diffusely due to absence of the right A1 segment. The left A1 segment is widely patent and supplies the right A2. There is a mildly bulbous appearance of the left MCA trifurcation region, with assessment limited by motion. Linear band of decreased signal through the distal left M1 segment in this region is favored to be artifactual due to motion. The right M1 segment and right MCA bifurcation are widely patent without evidence of major MCA branch occlusion bilaterally. IMPRESSION: 1. Acute left thalamic lacunar infarct. 2. Chronic, moderate ventriculomegaly which may reflect central predominant cerebral atrophy or hydrocephalus. Minimally increased periventricular white matter T2 changes from 2012 favored to reflect chronic small vessel ischemia. 3. No major intracranial arterial occlusion. Atherosclerotic changes with new, mild right ICA terminus stenosis. 4. Unchanged bilateral PCA stenoses. Electronically Signed   By: Logan Bores M.D.   On: 07/11/2015 18:42    Navraj Dreibelbis, DO  [facility] Pager [phone]  If 7PM-7AM, please contact night-coverage [email or web address] Password [facility] 07/12/2015, 10:42 AM

## 2015-07-12 NOTE — Progress Notes (Signed)
STROKE TEAM PROGRESS NOTE   HISTORY OF PRESENT ILLNESS (per record) Paula West is an 82 y.o. female who was at home with family when she was noted to have a right facial droop and left facial movement with talking, right sideed weakness and speech abnormality. LSN was 11:10. She was brought to [facility] as a code stroke. At time of arrival she had no right sided weakness mild right facial asymmetry when smiling and no speech abnormalities. She was brought to CT. CT showed no acute changes. Per family she has improved but continues to have dysarthria. At baseline she uses a walker and scoots around in a wheel chair. Given her improvement tPA was not given.   Date last known well: Today Time last known well: Time: 11:10 tPA Given: No: minimal symptoms   SUBJECTIVE (INTERVAL HISTORY) There is no family at the bed. Patient feels today she is "so-so". Discussion with patient, discussed her stroke, she wanted to know why she had a stroke, discussed she needs to follow with primary care for management of her vascular risk factors which is the likely cause including hyperlipidemia and diabetes. Also advised her on diet and exercise, as well as daily aspirin for secondary stroke prevention.   OBJECTIVE Temp:  [97.5 F (36.4 C)-99.4 F (37.4 C)] 98.1 F (36.7 C) (06/24 0500) Pulse Rate:  [62-82] 74 (06/24 0500) Cardiac Rhythm:  [-] Heart block (06/23 1906) Resp:  [15-22] 18 (06/24 0500) BP: (120-179)/(58-117) 152/106 mmHg (06/24 0500) SpO2:  [93 %-100 %] 95 % (06/24 0500) Weight:  [86.637 kg (191 lb)] 86.637 kg (191 lb) (06/23 1742)  CBC:  Recent Labs Lab 07/11/15 1503 07/11/15 1510 07/11/15 2344  WBC 9.8  --  10.1  NEUTROABS 6.1  --   --   HGB 15.3* 16.3* 14.8  HCT 45.6 48.0* 45.7  MCV 80.6  --  79.6  PLT 202  --  123456    Basic Metabolic Panel:  Recent Labs Lab 07/11/15 1503 07/11/15 1510 07/12/15 0535  NA 138 140 139  K 3.6 3.6 4.0  CL 103 101 103  CO2 27  --  24  GLUCOSE  182* 188* 138*  BUN 14 16 14   CREATININE 1.24* 1.10* 1.19*  CALCIUM 10.0  --  10.5*    Lipid Panel:    Component Value Date/Time   CHOL 179 07/11/2015 1620   TRIG 161* 07/11/2015 1620   HDL 44 07/11/2015 1620   CHOLHDL 4.1 07/11/2015 1620   VLDL 32 07/11/2015 1620   LDLCALC 103* 07/11/2015 1620   HgbA1c:  Lab Results  Component Value Date   HGBA1C 8.6* 07/11/2015   Urine Drug Screen:    Component Value Date/Time   LABOPIA NONE DETECTED 07/12/2015 0121   COCAINSCRNUR NONE DETECTED 07/12/2015 0121   LABBENZ NONE DETECTED 07/12/2015 0121   AMPHETMU NONE DETECTED 07/12/2015 0121   THCU NONE DETECTED 07/12/2015 0121   LABBARB NONE DETECTED 07/12/2015 0121      IMAGING  Ct Head Wo Contrast 07/11/2015   Progressive ventricular enlargement suggestive of hydrocephalus. This is greater than expected for the degree of atrophy and there is effacement of the sulci suggesting hydrocephalus. There may be periventricular resorption of CSF due to hydrocephalus No acute infarct     Mr Paula West Head/brain Wo Cm 07/11/2015   1. Acute left thalamic lacunar infarct.  2. Chronic, moderate ventriculomegaly which may reflect central predominant cerebral atrophy or hydrocephalus. Minimally increased periventricular white matter T2 changes from 2012 favored to reflect  chronic small vessel ischemia.  3. No major intracranial arterial occlusion. Atherosclerotic changes with new, mild right ICA terminus stenosis.  4. Unchanged bilateral PCA stenoses.    Transthoracic Echocardiogram 07/12/2015 Study Conclusions  - Left ventricle: The cavity size was normal. Wall thickness was  increased in a pattern of mild LVH. Systolic function was normal.  The estimated ejection fraction was in the range of 55% to 60%.  Wall motion was normal; there were no regional wall motion  abnormalities. Doppler parameters are consistent with abnormal  left ventricular relaxation (grade 1 diastolic dysfunction). -  Atrial septum: No defect or patent foramen ovale was identified.    PHYSICAL EXAM Physical exam: Exam: Gen: NAD Eyes: anicteric sclerae, moist conjunctivae                    CV: no MRG, no carotid bruits, no peripheral edema Mental Status: Alert, follows commands, good historian  Neuro: Detailed Neurologic Exam  Mental status: Patient was alert and oriented to self, month, year however her responses were not immediate it took her a little bit of time to think about it, she did know she was in [place] immediately when asked.  Speech:    Dysarthric, no apparent aphasia.  Cranial Nerves:    The pupils are equal, round, and reactive to light.. Attempted, Fundi not visualized due to small pupils.  EOMI.  Visual fields full. Mild right lower facial droop. Tongue midline. Hearing intact to voice. Shoulder shrug intact. She reports normal sensation bilateral face. Uvula rises symmetrically.  Motor Observation:    no involuntary movements noted. Tone appears normal.     Strength:    Patient has 5 out of 5 in the upper extremity, no upper extremity drift or pronation. In the lower extremities she was able to hold both her legs antigravity without drift, possibly mild drift in the left leg may be due to decreased effort.      Cerebellar: No dysmetria noted.  Sensation:  Intact to LT in all 4 extremities, no sensory deficits per patient.  Plantars downgoing.    ASSESSMENT/PLAN Ms. Paula West is a 82 y.o. female with history of asthma/COPD, hypertension, hyperlipidemia, diabetes mellitus, and chronic kidney disease  presenting with right-sided weakness and speech difficulties. She did not receive IV t-PA due to improvement in deficits.  Stroke:  Dominant infarct secondary to small vessel disease.  Resultant  dysarthria and mild facial droop. No apparent focal weakness.  MRI  - Acute left thalamic lacunar infarct.   MRA - No major intracranial arterial  occlusion.  Carotid Doppler - 1-39% ICA plaquing. VAs not insonated secondary to depth of vessels and patient's body habitus.  2D Echo  EF 55-60%. No cardiac source of emboli identified.  LDL - 103  HgbA1c - 8.6  VTE prophylaxis - Lovenox  Diet heart healthy/carb modified Room service appropriate?: Yes; Fluid consistency:: Thin  No antithrombotic prior to admission, now on aspirin 325 mg daily. Patient may be discharged on aspirin 81 mg daily for stroke protection and counseled for aggressive stroke risk management.  Patient counseled to be compliant with her antithrombotic medications  Ongoing aggressive stroke risk factor management  Therapy recommendations:  pending  Disposition:  Pending  Hypertension  Stable  Permissive hypertension (OK if < 220/120) but gradually normalize in 5-7 days  Long-term BP goal normotensive  Hyperlipidemia  Home meds:  Zocor 40 mg daily not resumed in hospital  LDL 103, goal < 70  Now  on Lipitor 40 mg daily.  Continue statin at discharge  Diabetes  HgbA1c 8.6, goal < 7.0  Uncontrolled  Other Stroke Risk Factors  Advanced age  Obesity, There is no weight on file to calculate BMI., recommend weight loss, diet and exercise as appropriate    Other Active Problems  Chronic kidney disease - creatinine 1.19  Hospital day #     Personally examined patient and images, and have participated in and made any corrections needed to history, physical, neuro exam,assessment and plan as stated above.  I have personally obtained the history, evaluated lab date, reviewed imaging studies and agree with radiology interpretations.   I had a long d/w patient about her recent stroke, risk for recurrent stroke/TIAs, personally independently reviewed imaging studies and stroke evaluation results and answered questions.Continue aspirin 81 mg for secondary stroke prevention and maintain strict control of hypertension with blood pressure goal below  130/90, diabetes with hemoglobin A1c goal below 6.5% and lipids with LDL cholesterol goal below 70 mg/dL. I also advised the patient to eat a healthy diet with plenty of whole grains, cereals, fruits and vegetables, exercise regularly and maintain ideal body weight .Followup in the future with me in2 months or call earlier if necessary.  Stroke team will sign off at this time.   Sarina Ill, MD Stroke Neurology [phone] [facility]       To contact Stroke Continuity provider, please refer to [email or web address] After hours, contact General Neurology

## 2015-07-12 NOTE — Discharge Summary (Signed)
Physician Discharge Summary  Paula West [id number] DOB: May 14, 1932 DOA: 07/11/2015  PCP: No primary care provider on file.  Admit date: 07/11/2015 Discharge date: 07/13/15  Admitted From: Home Disposition:  Home  Recommendations for Outpatient Follow-up:  1. Follow up with PCP in 1-2 weeks 2. Please obtain BMP/CBC in one week   Home Health: YES Equipment/Devices: PT/OT and ST  Discharge Condition:  Stable CODE STATUS: FULL Diet recommendation: Heart Healthy / Carb Modified   Brief/Interim Summary: 82 year old female with a history of hypertension, diabetes mellitus, hyperlipidemia, CKD, and COPD presented with one-day history of slurred speech and facial droop. According to the patient's daughter, the patient's "face didn't look right". Around 11 AM on 07/11/2015. In addition, the patient "couldn't get her words out". As a result, the patient presented for further workup. Initial CT of the brain showed progressive ventricular enlargement, but negative for acute infarct. Neurology was consulted, and the patient was admitted for a full stroke workup.  Discharge Diagnoses:  Acute left thalamic infarct -07/11/2015 MR brain--acute left thalamic infarct -07/11/2015 MRA brain--bilateral PCA stenosis unchanged; no major intracranial occlusions -Echocardiogram--EF 55-60 percent, grade 1 DD, no embolic source -Carotid ultrasound--negative for hemodynamically significant stenosis -LDL 103 -07/11/2015 hemoglobin A1c--8.6 -PT/OT-->home health PT/OT -Speech therapy -Allow for permissive hypertension and gradually normalize over next 5-7 days -Continue aspirin 81 mg daily after d/c  Hypertension -restart olmesartan/HCTZ after d/c -Allow for permissive hypertension and gradually normalize over next 5-7 days  Hypercalcemia -Likely due to HCTZ--discontinue during hospitalization -corrected calcium approx 10.5 -Gentle IVF-->improved  Diabetes mellitus type 2 -Start reduced dose  Lantus -NovoLog sliding scale -pt has dietary indiscretion at home--"sneaking food" which may explain necessity for significantly higher insulin dose at home  Hyperlipidemia -Discontinue Zocor 40 -Start Lipitor 40 mg daily  CKD stage III -Baseline creatinine 1.1-1.4 -serum creatinine 1.25 on day of d/c  COPD -Presently stable on room air   Discharge Instructions      Discharge Instructions    Ambulatory referral to Neurology    Complete by:  As directed   Dr. Jaynee Eagles requests follow up for this patient in 2 months.     Diet Carb Modified    Complete by:  As directed      Increase activity slowly    Complete by:  As directed             Medication List    STOP taking these medications        simvastatin 40 MG tablet  Commonly known as:  ZOCOR      TAKE these medications        aspirin 81 MG tablet  Take 1 tablet (81 mg total) by mouth daily.     atorvastatin 40 MG tablet  Commonly known as:  LIPITOR  Take 1 tablet (40 mg total) by mouth daily at 6 PM.     COLCRYS 0.6 MG tablet  Generic drug:  colchicine  Take 0.6 mg by mouth 2 (two) times daily as needed (for gout symptoms).     gabapentin 300 MG capsule  Commonly known as:  NEURONTIN  Take 300 mg by mouth daily.     insulin glargine 100 UNIT/ML injection  Commonly known as:  LANTUS  Inject 50-60 Units into the skin 2 (two) times daily. 60 units in the morning and 50 units at bedtime     olmesartan-hydrochlorothiazide 40-25 MG tablet  Commonly known as:  BENICAR HCT  Take 1 tablet by mouth daily.  omeprazole 20 MG capsule  Commonly known as:  PRILOSEC  Take 20 mg by mouth daily as needed (for heartburn).     PROAIR HFA 108 (90 Base) MCG/ACT inhaler  Generic drug:  albuterol  Inhale 2 puffs into the lungs every 6 (six) hours as needed for wheezing or shortness of breath.     TRADJENTA 5 MG Tabs tablet  Generic drug:  linagliptin  Take 5 mg by mouth daily.     traMADol 50 MG tablet  Commonly  known as:  ULTRAM  Take 50 mg by mouth 2 (two) times daily as needed for moderate pain.       Follow-up Information    Follow up with Melvenia Beam, MD. Schedule an appointment as soon as possible for a visit in 2 months.   Specialty:  Neurology   Contact information:   [address] [place] [address] [phone]       Please follow up.   Why:  Stroke Clinic     No Known Allergies  Consultations:  Neurology   Procedures/Studies: Ct Head Wo Contrast  07/11/2015  CLINICAL DATA:  Stroke.  Right-sided weakness and slurred speech. EXAM: CT HEAD WITHOUT CONTRAST TECHNIQUE: Contiguous axial images were obtained from the base of the skull through the vertex without intravenous contrast. COMPARISON:  CT 06/22/2010, MRI 06/24/2010 FINDINGS: Moderate ventricular enlargement is slightly more prominent compared with 2012. Biventricular distance 49.6 mm today, 47.8 mm previously. There is dilatation of the temporal horns with rounded frontal horns. Third ventricle also rounded. Findings suggest hydrocephalus. Periventricular low-density also has progressed and may be due to transependymal resorption of CSF as well as some chronic microvascular ischemia. 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 No acute infarct.  No acute hemorrhage or mass lesion. Calvarium intact. IMPRESSION: Progressive ventricular enlargement suggestive of hydrocephalus. This is greater than expected for the degree of atrophy and there is effacement of the sulci suggesting hydrocephalus. There may be periventricular resorption of CSF due to hydrocephalus No acute infarct These results were called by telephone at the time of interpretation on 07/11/2015 at 3:27 pm to Dr. Roland Rack , who verbally acknowledged these results. Electronically Signed   By: Franchot Gallo M.D.   On: 07/11/2015 15:28   Mr Brain Wo Contrast  07/11/2015  CLINICAL DATA:  Right facial droop, right-sided weakness, and speech  abnormality. EXAM: MRI HEAD WITHOUT CONTRAST MRA HEAD WITHOUT CONTRAST TECHNIQUE: Multiplanar, multiecho pulse sequences of the brain and surrounding structures were obtained without intravenous contrast. Angiographic images of the head were obtained using MRA technique without contrast. COMPARISON:  Head CT 07/11/2015.  Head MRI/MRA 06/24/2010 FINDINGS: MRI HEAD FINDINGS The study is mildly motion degraded. Partially empty sella is unchanged. There is a 6 mm acute lacunar infarct involving the lateral aspect of the left thalamus. No mass, midline shift, or extra-axial fluid collection. There is moderate enlargement of the lateral and third ventricles which is relatively similar to the prior MRI and remains out of proportion to the degree of sulcal enlargement. No obstructing lesion is identified at the level of the cerebral aqueduct. Confluent periventricular white matter T2 hyperintensities have minimally increased from the prior MRI and are nonspecific but may reflect moderate chronic small vessel ischemic disease, with transependymal CSF resorption considered less likely. There are chronic lacunar infarcts in the right thalamus and both cerebellar hemispheres, with the cerebellar infarcts new from the prior MRI and with chronic blood products associated with a chronic infarct  in the superior left cerebellum. Prior bilateral cataract extraction is noted. Paranasal sinuses and mastoid air cells are clear. Major intracranial vascular flow voids are preserved. MRA HEAD FINDINGS The study is mildly motion degraded. The visualized distal vertebral arteries are patent to the basilar with the left being slightly larger than the right. Left PICA origin is patent. Right PICA is not well seen. SCA origins are patent with chronic narrowing and attenuation of the proximal left SCA. Basilar artery is patent without stenosis. There is a patent left posterior communicating artery. PCAs are patent with unchanged, severe origins  stenosis on the left and moderate P2 stenosis on the right. The internal carotid arteries are patent from skullbase to carotid termini with mild narrowing of the right ICA terminus which appears new. The right ICA is slightly small in caliber diffusely due to absence of the right A1 segment. The left A1 segment is widely patent and supplies the right A2. There is a mildly bulbous appearance of the left MCA trifurcation region, with assessment limited by motion. Linear band of decreased signal through the distal left M1 segment in this region is favored to be artifactual due to motion. The right M1 segment and right MCA bifurcation are widely patent without evidence of major MCA branch occlusion bilaterally. IMPRESSION: 1. Acute left thalamic lacunar infarct. 2. Chronic, moderate ventriculomegaly which may reflect central predominant cerebral atrophy or hydrocephalus. Minimally increased periventricular white matter T2 changes from 2012 favored to reflect chronic small vessel ischemia. 3. No major intracranial arterial occlusion. Atherosclerotic changes with new, mild right ICA terminus stenosis. 4. Unchanged bilateral PCA stenoses. Electronically Signed   By: Logan Bores M.D.   On: 07/11/2015 18:42   Mr Jodene Nam Head/brain Wo Cm  07/11/2015  CLINICAL DATA:  Right facial droop, right-sided weakness, and speech abnormality. EXAM: MRI HEAD WITHOUT CONTRAST MRA HEAD WITHOUT CONTRAST TECHNIQUE: Multiplanar, multiecho pulse sequences of the brain and surrounding structures were obtained without intravenous contrast. Angiographic images of the head were obtained using MRA technique without contrast. COMPARISON:  Head CT 07/11/2015.  Head MRI/MRA 06/24/2010 FINDINGS: MRI HEAD FINDINGS The study is mildly motion degraded. Partially empty sella is unchanged. There is a 6 mm acute lacunar infarct involving the lateral aspect of the left thalamus. No mass, midline shift, or extra-axial fluid collection. There is moderate  enlargement of the lateral and third ventricles which is relatively similar to the prior MRI and remains out of proportion to the degree of sulcal enlargement. No obstructing lesion is identified at the level of the cerebral aqueduct. Confluent periventricular white matter T2 hyperintensities have minimally increased from the prior MRI and are nonspecific but may reflect moderate chronic small vessel ischemic disease, with transependymal CSF resorption considered less likely. There are chronic lacunar infarcts in the right thalamus and both cerebellar hemispheres, with the cerebellar infarcts new from the prior MRI and with chronic blood products associated with a chronic infarct in the superior left cerebellum. Prior bilateral cataract extraction is noted. Paranasal sinuses and mastoid air cells are clear. Major intracranial vascular flow voids are preserved. MRA HEAD FINDINGS The study is mildly motion degraded. The visualized distal vertebral arteries are patent to the basilar with the left being slightly larger than the right. Left PICA origin is patent. Right PICA is not well seen. SCA origins are patent with chronic narrowing and attenuation of the proximal left SCA. Basilar artery is patent without stenosis. There is a patent left posterior communicating artery. PCAs are patent with  unchanged, severe origins stenosis on the left and moderate P2 stenosis on the right. The internal carotid arteries are patent from skullbase to carotid termini with mild narrowing of the right ICA terminus which appears new. The right ICA is slightly small in caliber diffusely due to absence of the right A1 segment. The left A1 segment is widely patent and supplies the right A2. There is a mildly bulbous appearance of the left MCA trifurcation region, with assessment limited by motion. Linear band of decreased signal through the distal left M1 segment in this region is favored to be artifactual due to motion. The right M1 segment  and right MCA bifurcation are widely patent without evidence of major MCA branch occlusion bilaterally. IMPRESSION: 1. Acute left thalamic lacunar infarct. 2. Chronic, moderate ventriculomegaly which may reflect central predominant cerebral atrophy or hydrocephalus. Minimally increased periventricular white matter T2 changes from 2012 favored to reflect chronic small vessel ischemia. 3. No major intracranial arterial occlusion. Atherosclerotic changes with new, mild right ICA terminus stenosis. 4. Unchanged bilateral PCA stenoses. Electronically Signed   By: Logan Bores M.D.   On: 07/11/2015 18:42        Discharge Exam: Filed Vitals:   07/13/15 0732 07/13/15 1046  BP: 130/79 146/72  Pulse: 72 74  Temp: 98.2 F (36.8 C) 98.3 F (36.8 C)  Resp: 18 18   Filed Vitals:   07/12/15 2128 07/13/15 0518 07/13/15 0732 07/13/15 1046  BP: 102/77 132/85 130/79 146/72  Pulse: 81 69 72 74  Temp: 98 F (36.7 C) 99.1 F (37.3 C) 98.2 F (36.8 C) 98.3 F (36.8 C)  TempSrc: Oral Oral Oral Oral  Resp: 17 19 18 18   Height:      Weight:      SpO2: 100% 95% 97% 97%    General: Pt is alert, awake, not in acute distress Cardiovascular: RRR, S1/S2 +, no rubs, no gallops Respiratory: CTA bilaterally, no wheezing, no rhonchi Abdominal: Soft, NT, ND, bowel sounds + Extremities: no edema, no cyanosis   The results of significant diagnostics from this hospitalization (including imaging, microbiology, ancillary and laboratory) are listed below for reference.    Significant Diagnostic Studies: Ct Head Wo Contrast  07/11/2015  CLINICAL DATA:  Stroke.  Right-sided weakness and slurred speech. EXAM: CT HEAD WITHOUT CONTRAST TECHNIQUE: Contiguous axial images were obtained from the base of the skull through the vertex without intravenous contrast. COMPARISON:  CT 06/22/2010, MRI 06/24/2010 FINDINGS: Moderate ventricular enlargement is slightly more prominent compared with 2012. Biventricular distance 49.6 mm  today, 47.8 mm previously. There is dilatation of the temporal horns with rounded frontal horns. Third ventricle also rounded. Findings suggest hydrocephalus. Periventricular low-density also has progressed and may be due to transependymal resorption of CSF as well as some chronic microvascular ischemia. 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 No acute infarct.  No acute hemorrhage or mass lesion. Calvarium intact. IMPRESSION: Progressive ventricular enlargement suggestive of hydrocephalus. This is greater than expected for the degree of atrophy and there is effacement of the sulci suggesting hydrocephalus. There may be periventricular resorption of CSF due to hydrocephalus No acute infarct These results were called by telephone at the time of interpretation on 07/11/2015 at 3:27 pm to Dr. Roland Rack , who verbally acknowledged these results. Electronically Signed   By: Franchot Gallo M.D.   On: 07/11/2015 15:28   Mr Brain Wo Contrast  07/11/2015  CLINICAL DATA:  Right facial droop, right-sided weakness, and speech abnormality. EXAM: MRI HEAD WITHOUT  CONTRAST MRA HEAD WITHOUT CONTRAST TECHNIQUE: Multiplanar, multiecho pulse sequences of the brain and surrounding structures were obtained without intravenous contrast. Angiographic images of the head were obtained using MRA technique without contrast. COMPARISON:  Head CT 07/11/2015.  Head MRI/MRA 06/24/2010 FINDINGS: MRI HEAD FINDINGS The study is mildly motion degraded. Partially empty sella is unchanged. There is a 6 mm acute lacunar infarct involving the lateral aspect of the left thalamus. No mass, midline shift, or extra-axial fluid collection. There is moderate enlargement of the lateral and third ventricles which is relatively similar to the prior MRI and remains out of proportion to the degree of sulcal enlargement. No obstructing lesion is identified at the level of the cerebral aqueduct. Confluent periventricular white matter T2  hyperintensities have minimally increased from the prior MRI and are nonspecific but may reflect moderate chronic small vessel ischemic disease, with transependymal CSF resorption considered less likely. There are chronic lacunar infarcts in the right thalamus and both cerebellar hemispheres, with the cerebellar infarcts new from the prior MRI and with chronic blood products associated with a chronic infarct in the superior left cerebellum. Prior bilateral cataract extraction is noted. Paranasal sinuses and mastoid air cells are clear. Major intracranial vascular flow voids are preserved. MRA HEAD FINDINGS The study is mildly motion degraded. The visualized distal vertebral arteries are patent to the basilar with the left being slightly larger than the right. Left PICA origin is patent. Right PICA is not well seen. SCA origins are patent with chronic narrowing and attenuation of the proximal left SCA. Basilar artery is patent without stenosis. There is a patent left posterior communicating artery. PCAs are patent with unchanged, severe origins stenosis on the left and moderate P2 stenosis on the right. The internal carotid arteries are patent from skullbase to carotid termini with mild narrowing of the right ICA terminus which appears new. The right ICA is slightly small in caliber diffusely due to absence of the right A1 segment. The left A1 segment is widely patent and supplies the right A2. There is a mildly bulbous appearance of the left MCA trifurcation region, with assessment limited by motion. Linear band of decreased signal through the distal left M1 segment in this region is favored to be artifactual due to motion. The right M1 segment and right MCA bifurcation are widely patent without evidence of major MCA branch occlusion bilaterally. IMPRESSION: 1. Acute left thalamic lacunar infarct. 2. Chronic, moderate ventriculomegaly which may reflect central predominant cerebral atrophy or hydrocephalus. Minimally  increased periventricular white matter T2 changes from 2012 favored to reflect chronic small vessel ischemia. 3. No major intracranial arterial occlusion. Atherosclerotic changes with new, mild right ICA terminus stenosis. 4. Unchanged bilateral PCA stenoses. Electronically Signed   By: Logan Bores M.D.   On: 07/11/2015 18:42   Mr Jodene Nam Head/brain Wo Cm  07/11/2015  CLINICAL DATA:  Right facial droop, right-sided weakness, and speech abnormality. EXAM: MRI HEAD WITHOUT CONTRAST MRA HEAD WITHOUT CONTRAST TECHNIQUE: Multiplanar, multiecho pulse sequences of the brain and surrounding structures were obtained without intravenous contrast. Angiographic images of the head were obtained using MRA technique without contrast. COMPARISON:  Head CT 07/11/2015.  Head MRI/MRA 06/24/2010 FINDINGS: MRI HEAD FINDINGS The study is mildly motion degraded. Partially empty sella is unchanged. There is a 6 mm acute lacunar infarct involving the lateral aspect of the left thalamus. No mass, midline shift, or extra-axial fluid collection. There is moderate enlargement of the lateral and third ventricles which is relatively similar to the  prior MRI and remains out of proportion to the degree of sulcal enlargement. No obstructing lesion is identified at the level of the cerebral aqueduct. Confluent periventricular white matter T2 hyperintensities have minimally increased from the prior MRI and are nonspecific but may reflect moderate chronic small vessel ischemic disease, with transependymal CSF resorption considered less likely. There are chronic lacunar infarcts in the right thalamus and both cerebellar hemispheres, with the cerebellar infarcts new from the prior MRI and with chronic blood products associated with a chronic infarct in the superior left cerebellum. Prior bilateral cataract extraction is noted. Paranasal sinuses and mastoid air cells are clear. Major intracranial vascular flow voids are preserved. MRA HEAD FINDINGS The  study is mildly motion degraded. The visualized distal vertebral arteries are patent to the basilar with the left being slightly larger than the right. Left PICA origin is patent. Right PICA is not well seen. SCA origins are patent with chronic narrowing and attenuation of the proximal left SCA. Basilar artery is patent without stenosis. There is a patent left posterior communicating artery. PCAs are patent with unchanged, severe origins stenosis on the left and moderate P2 stenosis on the right. The internal carotid arteries are patent from skullbase to carotid termini with mild narrowing of the right ICA terminus which appears new. The right ICA is slightly small in caliber diffusely due to absence of the right A1 segment. The left A1 segment is widely patent and supplies the right A2. There is a mildly bulbous appearance of the left MCA trifurcation region, with assessment limited by motion. Linear band of decreased signal through the distal left M1 segment in this region is favored to be artifactual due to motion. The right M1 segment and right MCA bifurcation are widely patent without evidence of major MCA branch occlusion bilaterally. IMPRESSION: 1. Acute left thalamic lacunar infarct. 2. Chronic, moderate ventriculomegaly which may reflect central predominant cerebral atrophy or hydrocephalus. Minimally increased periventricular white matter T2 changes from 2012 favored to reflect chronic small vessel ischemia. 3. No major intracranial arterial occlusion. Atherosclerotic changes with new, mild right ICA terminus stenosis. 4. Unchanged bilateral PCA stenoses. Electronically Signed   By: Logan Bores M.D.   On: 07/11/2015 18:42     Microbiology: No results found for this or any previous visit (from the past 240 hour(s)).   Labs: Basic Metabolic Panel:  Recent Labs Lab 07/11/15 1503 07/11/15 1510 07/12/15 0535 07/13/15 0636  NA 138 140 139 140  K 3.6 3.6 4.0 3.1*  CL 103 101 103 105  CO2 27   --  24 25  GLUCOSE 182* 188* 138* 152*  BUN 14 16 14 14   CREATININE 1.24* 1.10* 1.19* 1.25*  CALCIUM 10.0  --  10.5* 9.6   Liver Function Tests:  Recent Labs Lab 07/11/15 1503  AST 19  ALT 22  ALKPHOS 75  BILITOT 0.4  PROT 7.6  ALBUMIN 3.9   No results for input(s): LIPASE, AMYLASE in the last 168 hours. No results for input(s): AMMONIA in the last 168 hours. CBC:  Recent Labs Lab 07/11/15 1503 07/11/15 1510 07/11/15 2344  WBC 9.8  --  10.1  NEUTROABS 6.1  --   --   HGB 15.3* 16.3* 14.8  HCT 45.6 48.0* 45.7  MCV 80.6  --  79.6  PLT 202  --  230   Cardiac Enzymes: No results for input(s): CKTOTAL, CKMB, CKMBINDEX, TROPONINI in the last 168 hours. BNP: Invalid input(s): POCBNP CBG:  Recent Labs Lab 07/11/15  2253 07/12/15 0624 07/12/15 1131 07/12/15 2126 07/13/15 0649  GLUCAP 102* 146* 205* 135* 150*    Time coordinating discharge:  Greater than 30 minutes  Signed:  Genita Nilsson, DO [facility] Pager: [phone] 07/13/2015, 11:00 AM

## 2015-07-12 NOTE — Progress Notes (Addendum)
VASCULAR LAB PRELIMINARY  PRELIMINARY  PRELIMINARY  PRELIMINARY  Carotid duplex completed.    Preliminary report:  1-39% ICA plaquing.  Vertebral arteries not insonated secondary to depth of vessels and patient's body habitus.  Haizlee Henton, RVT 07/12/2015, 8:26 AM

## 2015-07-12 NOTE — Progress Notes (Signed)
Occupational Therapy Evaluation Patient Details Name: Paula West MRN: [id number] DOB: 01-02-1933 Today's Date: 07/12/2015    History of Present Illness 82 y.o. female admitted for R sided weakness, R facial droop, and slurred speech. MRI (+) for L thalamic infarct. PMH significant for HTN, HLD, COPD, asthma, and DM type II.   Clinical Impression   PTA, pt required mod assist for ADLs and used RW or transport chair for mobility. Pt presents with RUE weakness, balance impairments and cognitive deficits. Pt currently requires max assist for ADLs and min assist for transfers and ambulation. Pt plans to d/c home with 24/7 assistance from her daughter and caregiver. Pt will benefit from continued acute OT to increase independence and safety with ADLs and mobility to allow for safe discharge home. Recommend HHOT. Will continue to follow acutely.    Follow Up Recommendations  Home health OT;Supervision/Assistance - 24 hour    Equipment Recommendations  None recommended by OT    Recommendations for Other Services       Precautions / Restrictions Precautions Precautions: Fall Restrictions Weight Bearing Restrictions: No      Mobility Bed Mobility Overal bed mobility: Needs Assistance Bed Mobility: Supine to Sit     Supine to sit: Min assist;HOB elevated     General bed mobility comments: HOB elevated, use of bedrails. Min assist for trunk support to come to sitting position. VCs for hand placement.  Transfers Overall transfer level: Needs assistance Equipment used: Rolling walker (2 wheeled) Transfers: Sit to/from [facility] Sit to Stand: Min assist Stand pivot transfers: Min assist       General transfer comment: Min assist for boost to stand and to stabilize balance upon standing. Pt with decreased safety awareness and required mod cues for safe hand placement on seated surfaces and for proper use of RW.     Balance Overall balance assessment: Needs  assistance Sitting-balance support: No upper extremity supported;Feet supported Sitting balance-Leahy Scale: Good     Standing balance support: Bilateral upper extremity supported;During functional activity Standing balance-Leahy Scale: Poor                              ADL Overall ADL's : Needs assistance/impaired         Upper Body Bathing: Set up;Sitting   Lower Body Bathing: Maximal assistance;Sit to/from stand   Upper Body Dressing : Set up;Sitting   Lower Body Dressing: Maximal assistance;Sit to/from stand   Toilet Transfer: Minimal assistance;Cueing for safety;Stand-pivot;BSC;RW   Toileting- Clothing Manipulation and Hygiene: Maximal assistance;Sit to/from stand       Functional mobility during ADLs: Minimal assistance;Cueing for safety;Rolling walker General ADL Comments: Pt somewhat impulsive and with decreased deficit awareness - requires mod cues     Vision Vision Assessment?: Yes Eye Alignment: Within Functional Limits Ocular Range of Motion: Within Functional Limits Alignment/Gaze Preference: Within Defined Limits Tracking/Visual Pursuits: Decreased smoothness of vertical tracking Saccades: Other (comment) (pt unable to follow directions) Additional Comments: May need further assessment - difficult due to pt's HOH and cognitive deficits   Perception Perception Perception Tested?: Yes Perception Deficits: Inattention/neglect Inattention/Neglect: Appears intact   Praxis      Pertinent Vitals/Pain Pain Assessment: Faces Faces Pain Scale: Hurts little more Pain Location: R shoulder and scapula Pain Descriptors / Indicators: Aching Pain Intervention(s): Limited activity within patient's tolerance;Monitored during session;Repositioned     Hand Dominance Right   Extremity/Trunk Assessment Upper Extremity Assessment Upper Extremity Assessment:  RUE deficits/detail RUE Deficits / Details: STRENGTH: deltoids 3/5, biceps, triceps, forearm,  grip 3+/5; ROM: shoulder flexion 110 degrees (limited due to pain in shoulder), others wfl; SENSATION: light touch appears intact  RUE: Unable to fully assess due to pain RUE Coordination: decreased gross motor;decreased fine motor   Lower Extremity Assessment Lower Extremity Assessment: Defer to PT evaluation   Cervical / Trunk Assessment Cervical / Trunk Assessment: Kyphotic   Communication Communication Communication: HOH   Cognition Arousal/Alertness: Awake/alert Behavior During Therapy: WFL for tasks assessed/performed Overall Cognitive Status: Impaired/Different from baseline Area of Impairment: Memory;Attention;Following commands;Safety/judgement;Problem solving;Awareness   Current Attention Level: Sustained Memory: Decreased short-term memory Following Commands: Follows one step commands with increased time Safety/Judgement: Decreased awareness of safety;Decreased awareness of deficits Awareness: Intellectual Problem Solving: Slow processing;Requires verbal cues General Comments: Impulsive and unaware of limitations. High fall risk due to decreased safety awareness.   General Comments       Exercises       Shoulder Instructions      Home Living Family/patient expects to be discharged to:: Private residence Living Arrangements: Children (daughter) Available Help at Discharge: Family;Personal care attendant;Available 24 hours/day Type of Home: House       Home Layout: One level     Bathroom Shower/Tub: Tub/shower unit Shower/tub characteristics: Curtain Biochemist, clinical: Standard     Home Equipment: Environmental consultant - 2 wheels;Cane - single point;Bedside commode;Shower seat;Transport chair   Additional Comments: Pt has a caregiver that comes 7 days/week and lives with her daughter who also assists her with all ADLs.      Prior Functioning/Environment Level of Independence: Needs assistance  Gait / Transfers Assistance Needed: RW for in house mobility, transport  chair for community mobility ADL's / Homemaking Assistance Needed: Assist for bathing, dressing, getting in/out of bed and shower. Total assist for IADLs        OT Diagnosis: Generalized weakness;Cognitive deficits;Acute pain;Paresis   OT Problem List: Decreased strength;Decreased range of motion;Decreased activity tolerance;Impaired balance (sitting and/or standing);Decreased coordination;Decreased safety awareness;Decreased knowledge of use of DME or AE;Decreased cognition;Pain   OT Treatment/Interventions: Self-care/ADL training;Energy conservation;DME and/or AE instruction;Therapeutic activities;Patient/family education;Balance training    OT Goals(Current goals can be found in the care plan section) Acute Rehab OT Goals Patient Stated Goal: none stated OT Goal Formulation: With patient Time For Goal Achievement: 07/26/15 Potential to Achieve Goals: Good ADL Goals Pt Will Perform Upper Body Bathing: with caregiver independent in assisting;sitting;with min guard assist Pt Will Perform Lower Body Bathing: with caregiver independent in assisting;sit to/from stand;with min guard assist Pt Will Transfer to Toilet: ambulating;bedside commode;with min guard assist (over toilet) Pt Will Perform Toileting - Clothing Manipulation and hygiene: with min guard assist;sitting/lateral leans;sit to/from stand Pt/caregiver will Perform Home Exercise Program: With Supervision;Increased ROM;Increased strength;Right Upper extremity;With written HEP provided  OT Frequency: Min 3X/week   Barriers to D/C:            Co-evaluation              End of Session Equipment Utilized During Treatment: Gait belt;Rolling walker Nurse Communication: Mobility status  Activity Tolerance: Patient tolerated treatment well Patient left: in chair;with call bell/phone within reach;with chair alarm set;with family/visitor present   Time: 1313-1346 OT Time Calculation (min): 33 min Charges:  OT General  Charges $OT Visit: 1 Procedure OT Evaluation $OT Eval Moderate Complexity: 1 Procedure OT Treatments $Self Care/Home Management : 8-22 mins G-Codes: OT G-codes **NOT FOR INPATIENT CLASS** Functional Assessment Tool Used: clinical judgement Functional  Limitation: Self care Self Care Current Status [phone]): At least 40 percent but less than 60 percent impaired, limited or restricted Self Care Goal Status [id number]): At least 20 percent but less than 40 percent impaired, limited or restricted  Redmond Baseman, OTR/L Pager: [phone] 07/12/2015, 2:17 PM

## 2015-07-12 NOTE — Evaluation (Signed)
Physical Therapy Evaluation Patient Details Name: Paula West MRN: [id number] DOB: 06-18-1932 Today's Date: 07/12/2015   History of Present Illness  82 y.o. female admitted for R sided weakness, R facial droop, and slurred speech. MRI (+) for L thalamic infarct. PMH significant for HTN, HLD, COPD, asthma, and DM type II.  Clinical Impression  Pt admitted with/for s/s of stroke, per MRI positive for L thalamic infarct..  Pt currently limited functionally due to the problems listed below.  (see problems list.)  Pt will benefit from PT to maximize function and safety to be able to get home safely with available assist of family.     Follow Up Recommendations Home health PT;Supervision for mobility/OOB    Equipment Recommendations  None recommended by PT    Recommendations for Other Services       Precautions / Restrictions Precautions Precautions: Fall Restrictions Weight Bearing Restrictions: No      Mobility  Bed Mobility Overal bed mobility: Needs Assistance Bed Mobility: Rolling;Sidelying to Sit Rolling: Min guard Sidelying to sit: Min assist Supine to sit: Min assist;HOB elevated     General bed mobility comments: moderate use of rail  Transfers Overall transfer level: Needs assistance Equipment used: Rolling walker (2 wheeled) Transfers: Sit to/from Stand Sit to Stand: Min assist Stand pivot transfers: Min assist       General transfer comment: stability assist  Ambulation/Gait             General Gait Details: pt declined, because just back in bed from bathroom  Stairs            Wheelchair Mobility    Modified Rankin (Stroke Patients Only) Modified Rankin (Stroke Patients Only) Pre-Morbid Rankin Score: No significant disability Modified Rankin: Moderately severe disability     Balance Overall balance assessment: Needs assistance Sitting-balance support: Feet supported;No upper extremity supported Sitting balance-Leahy Scale: Good      Standing balance support: No upper extremity supported;Bilateral upper extremity supported Standing balance-Leahy Scale: Fair                               Pertinent Vitals/Pain Pain Assessment: Faces Faces Pain Scale: No hurt Pain Location: R shoulder and scapula Pain Descriptors / Indicators: Aching Pain Intervention(s): Limited activity within patient's tolerance;Monitored during session;Repositioned    Home Living Family/patient expects to be discharged to:: Private residence Living Arrangements: Children (daughter) Available Help at Discharge: Family;Personal care attendant;Available 24 hours/day Type of Home: House       Home Layout: One level Home Equipment: [place] - 2 wheels;Cane - single point;Bedside commode;Shower seat;Transport chair Additional Comments: Pt has a caregiver that comes 7 days/week and lives with her daughter who also assists her with all ADLs.    Prior Function Level of Independence: Needs assistance   Gait / Transfers Assistance Needed: RW for in house mobility, transport chair for community mobility  ADL's / Homemaking Assistance Needed: Assist for bathing, dressing, getting in/out of bed and shower. Total assist for IADLs        Hand Dominance   Dominant Hand: Right    Extremity/Trunk Assessment   Upper Extremity Assessment: Defer to OT evaluation RUE Deficits / Details: STRENGTH: deltoids 3/5, biceps, triceps, forearm, grip 3+/5; ROM: shoulder flexion 110 degrees (limited due to pain in shoulder), others wfl; SENSATION: light touch appears intact  RUE: Unable to fully assess due to pain       Lower Extremity Assessment: RLE  deficits/detail;LLE deficits/detail RLE Deficits / Details: proximal weakness--hip flexion 4-/5, o/w 4/5 LLE Deficits / Details: proximal weakness--hip flexion 3+/5 o/w 4/5  Cervical / Trunk Assessment: Kyphotic  Communication   Communication: HOH  Cognition Arousal/Alertness:  Awake/alert Behavior During Therapy: WFL for tasks assessed/performed Overall Cognitive Status: Impaired/Different from baseline Area of Impairment: Memory;Attention;Following commands;Safety/judgement;Problem solving;Awareness   Current Attention Level: Sustained Memory: Decreased short-term memory Following Commands: Follows one step commands with increased time Safety/Judgement: Decreased awareness of safety;Decreased awareness of deficits Awareness: Intellectual Problem Solving: Slow processing;Requires verbal cues General Comments: Impulsive and unaware of limitations. High fall risk due to decreased safety awareness.    General Comments      Exercises        Assessment/Plan    PT Assessment Patient needs continued PT services  PT Diagnosis Generalized weakness;Other (comment) (paresis)   PT Problem List Decreased strength;Decreased activity tolerance;Decreased balance;Decreased mobility;Decreased safety awareness;Decreased coordination  PT Treatment Interventions Gait training;Stair training;Functional mobility training;Therapeutic activities;Balance training;Patient/family education;DME instruction   PT Goals (Current goals can be found in the Care Plan section) Acute Rehab PT Goals Patient Stated Goal: none stated PT Goal Formulation: With patient Time For Goal Achievement: 07/19/15 Potential to Achieve Goals: Good    Frequency Min 3X/week   Barriers to discharge        Co-evaluation               End of Session   Activity Tolerance: Patient tolerated treatment well Patient left: in bed;with call bell/phone within reach;with family/visitor present Nurse Communication: Mobility status         Time: [id number] PT Time Calculation (min) (ACUTE ONLY): 21 min   Charges:   PT Evaluation $PT Eval Moderate Complexity: 1 Procedure     PT G Codes:        Riot Barrick, Tessie Fass 07/12/2015, 4:57 PM  07/12/2015  Donnella Sham,  PT [phone]  (pager)

## 2015-07-12 NOTE — Progress Notes (Signed)
Echocardiogram 2D Echocardiogram has been performed.  Paula West 07/12/2015, 9:43 AM

## 2015-07-13 DIAGNOSIS — J449 Chronic obstructive pulmonary disease, unspecified: Secondary | ICD-10-CM | POA: Insufficient documentation

## 2015-07-13 LAB — VAS US CAROTID
LCCAPDIAS: 10 cm/s
LEFT ECA DIAS: -7 cm/s
LICAPDIAS: -11 cm/s
Left CCA dist dias: -9 cm/s
Left CCA dist sys: -39 cm/s
Left CCA prox sys: 65 cm/s
Left ICA prox sys: -44 cm/s
RCCADSYS: -54 cm/s
RCCAPDIAS: 12 cm/s
RCCAPSYS: 49 cm/s
RIGHT ECA DIAS: -8 cm/s

## 2015-07-13 LAB — BASIC METABOLIC PANEL
ANION GAP: 10 (ref 5–15)
BUN: 14 mg/dL (ref 6–20)
CALCIUM: 9.6 mg/dL (ref 8.9–10.3)
CO2: 25 mmol/L (ref 22–32)
Chloride: 105 mmol/L (ref 101–111)
Creatinine, Ser: 1.25 mg/dL — ABNORMAL HIGH (ref 0.44–1.00)
GFR, EST AFRICAN AMERICAN: 46 mL/min — AB (ref >60)
GFR, EST NON AFRICAN AMERICAN: 40 mL/min — AB (ref >60)
GLUCOSE: 152 mg/dL — AB (ref 65–99)
POTASSIUM: 3.1 mmol/L — AB (ref 3.5–5.1)
Sodium: 140 mmol/L (ref 135–145)

## 2015-07-13 LAB — GLUCOSE, CAPILLARY
GLUCOSE-CAPILLARY: 209 mg/dL — AB (ref 65–99)
Glucose-Capillary: 150 mg/dL — ABNORMAL HIGH (ref 65–99)

## 2015-07-13 MED ORDER — POTASSIUM CHLORIDE CRYS ER 20 MEQ PO TBCR
40.0000 meq | EXTENDED_RELEASE_TABLET | Freq: Once | ORAL | Status: AC
  Administered 2015-07-13: 40 meq via ORAL
  Filled 2015-07-13: qty 2

## 2015-07-13 NOTE — Progress Notes (Signed)
Occupational Therapy Treatment Patient Details Name: Paula West MRN: [id number] DOB: 1932/02/24 Today's Date: 07/13/2015    History of present illness 82 y.o. female admitted for R sided weakness, R facial droop, and slurred speech. MRI (+) for L thalamic infarct. PMH significant for HTN, HLD, COPD, asthma, and DM type II.   OT comments  Pt. Making gains with acute OT.  Able to complete bed mobility with decreased assistance from previous session.  Requires cues for safety and sequencing during functional mobility and transfers.  Reports 24/7 assistance at home for ADLS as needed.  Will continue to follow acutely.    Follow Up Recommendations  Home health OT;Supervision/Assistance - 24 hour    Equipment Recommendations  None recommended by OT    Recommendations for Other Services      Precautions / Restrictions Precautions Precautions: Fall Precaution Comments: HOH       Mobility Bed Mobility Overal bed mobility: Modified Independent Bed Mobility: Rolling;Sidelying to Sit Rolling: Modified independent (Device/Increase time) Sidelying to sit: Modified independent (Device/Increase time)       General bed mobility comments: HOB flat, no rails, exited on L side of bed-no cues required for this task  Transfers Overall transfer level: Needs assistance Equipment used: Rolling walker (2 wheeled) Transfers: Sit to/from [facility] Sit to Stand: Min assist Stand pivot transfers: Min guard       General transfer comment: stability assist, b ue support on RW, encouraged pushing from bed "oh that aint gonna work", and pt. pushed RW very far in front of her and pushed up on it, max cues for staying inside the walker, pt. cont. to complete stand pivot transfer with her own "techniques".      Balance                                   ADL Overall ADL's : Needs assistance/impaired                     Lower Body Dressing: Sitting/lateral  leans;Supervision/safety Lower Body Dressing Details (indicate cue type and reason): pt. able to reach forward to B LEs and adjust socks no LOB noted Toilet Transfer: Min Insurance claims handler Details (indicate cue type and reason): simulated from eob with pivotal steps to recliner Toileting- Clothing Manipulation and Hygiene: Moderate assistance;Sit to/from stand;Sitting/lateral lean Toileting - Clothing Manipulation Details (indicate cue type and reason): simulated during in room transfer     Functional mobility during ADLs: Minimal assistance;Cueing for safety;Rolling walker General ADL Comments: pt. able to perform LB dressing, but reports she has 24/7 assist as needed at home.  stand pivot to recliner (declined b.room use). use for RW safety pt. noncompliant with inst. cues      Vision                     Perception     Praxis      Cognition   Behavior During Therapy: [facility] for tasks assessed/performed Overall Cognitive Status: Impaired/Different from baseline                  General Comments: HOH makes cognition difficult to fully assess     Extremity/Trunk Assessment               Exercises     Shoulder Instructions       General Comments      Pertinent  Vitals/ Pain       Pain Assessment: No/denies pain  Home Living                                          Prior Functioning/Environment              Frequency Min 3X/week     Progress Toward Goals  OT Goals(current goals can now be found in the care plan section)  Progress towards OT goals: Progressing toward goals     Plan Discharge plan remains appropriate    Co-evaluation                 End of Session Equipment Utilized During Treatment: Gait belt;Rolling walker   Activity Tolerance Patient tolerated treatment well   Patient Left in chair;with call bell/phone within reach;with chair alarm set   Nurse Communication           Time: [phone] OT Time Calculation (min): 15 min  Charges: OT General Charges $OT Visit: 1 Procedure OT Treatments $Self Care/Home Management : 8-22 mins  Janice Coffin, COTA/L 07/13/2015, 8:52 AM

## 2015-07-13 NOTE — Progress Notes (Signed)
Pt discharging at this time with daughter taking all personal belongings. IV discontinued, dry dressing applied. Discharge instructions and prescription provided with verbal understanding. Pt denies pain or discomfort. No noted distress. Pt made aware of follow up appts.

## 2015-08-01 ENCOUNTER — Inpatient Hospital Stay (HOSPITAL_COMMUNITY)
Admission: EM | Admit: 2015-08-01 | Discharge: 2015-08-05 | DRG: 282 | Disposition: A | Discharge status: Home Health | Payer: Medicare Other | Attending: Internal Medicine | Admitting: Internal Medicine

## 2015-08-01 ENCOUNTER — Encounter (HOSPITAL_COMMUNITY): Payer: Self-pay | Admitting: Emergency Medicine

## 2015-08-01 ENCOUNTER — Emergency Department (HOSPITAL_COMMUNITY): Payer: Medicare Other

## 2015-08-01 DIAGNOSIS — J449 Chronic obstructive pulmonary disease, unspecified: Secondary | ICD-10-CM | POA: Diagnosis present

## 2015-08-01 DIAGNOSIS — E669 Obesity, unspecified: Secondary | ICD-10-CM | POA: Diagnosis not present

## 2015-08-01 DIAGNOSIS — E876 Hypokalemia: Secondary | ICD-10-CM | POA: Diagnosis present

## 2015-08-01 DIAGNOSIS — Z6828 Body mass index (BMI) 28.0-28.9, adult: Secondary | ICD-10-CM

## 2015-08-01 DIAGNOSIS — E1122 Type 2 diabetes mellitus with diabetic chronic kidney disease: Secondary | ICD-10-CM | POA: Diagnosis not present

## 2015-08-01 DIAGNOSIS — H9190 Unspecified hearing loss, unspecified ear: Secondary | ICD-10-CM | POA: Diagnosis present

## 2015-08-01 DIAGNOSIS — R1013 Epigastric pain: Secondary | ICD-10-CM | POA: Diagnosis present

## 2015-08-01 DIAGNOSIS — I214 Non-ST elevation (NSTEMI) myocardial infarction: Secondary | ICD-10-CM | POA: Diagnosis not present

## 2015-08-01 DIAGNOSIS — Z794 Long term (current) use of insulin: Secondary | ICD-10-CM

## 2015-08-01 DIAGNOSIS — I1310 Hypertensive heart and chronic kidney disease without heart failure, with stage 1 through stage 4 chronic kidney disease, or unspecified chronic kidney disease: Secondary | ICD-10-CM | POA: Diagnosis present

## 2015-08-01 DIAGNOSIS — Z833 Family history of diabetes mellitus: Secondary | ICD-10-CM | POA: Diagnosis not present

## 2015-08-01 DIAGNOSIS — E1165 Type 2 diabetes mellitus with hyperglycemia: Secondary | ICD-10-CM

## 2015-08-01 DIAGNOSIS — I700 Atherosclerosis of aorta: Secondary | ICD-10-CM | POA: Diagnosis present

## 2015-08-01 DIAGNOSIS — R079 Chest pain, unspecified: Secondary | ICD-10-CM | POA: Diagnosis present

## 2015-08-01 DIAGNOSIS — Z66 Do not resuscitate: Secondary | ICD-10-CM | POA: Diagnosis not present

## 2015-08-01 DIAGNOSIS — E785 Hyperlipidemia, unspecified: Secondary | ICD-10-CM | POA: Diagnosis present

## 2015-08-01 DIAGNOSIS — Z8673 Personal history of transient ischemic attack (TIA), and cerebral infarction without residual deficits: Secondary | ICD-10-CM | POA: Diagnosis not present

## 2015-08-01 DIAGNOSIS — N183 Chronic kidney disease, stage 3 unspecified: Secondary | ICD-10-CM | POA: Diagnosis present

## 2015-08-01 DIAGNOSIS — R7989 Other specified abnormal findings of blood chemistry: Secondary | ICD-10-CM | POA: Insufficient documentation

## 2015-08-01 DIAGNOSIS — R109 Unspecified abdominal pain: Secondary | ICD-10-CM

## 2015-08-01 DIAGNOSIS — E119 Type 2 diabetes mellitus without complications: Secondary | ICD-10-CM

## 2015-08-01 DIAGNOSIS — Z7982 Long term (current) use of aspirin: Secondary | ICD-10-CM | POA: Diagnosis not present

## 2015-08-01 DIAGNOSIS — Z87891 Personal history of nicotine dependence: Secondary | ICD-10-CM | POA: Diagnosis not present

## 2015-08-01 DIAGNOSIS — R778 Other specified abnormalities of plasma proteins: Secondary | ICD-10-CM | POA: Insufficient documentation

## 2015-08-01 DIAGNOSIS — Z885 Allergy status to narcotic agent status: Secondary | ICD-10-CM

## 2015-08-01 DIAGNOSIS — K219 Gastro-esophageal reflux disease without esophagitis: Secondary | ICD-10-CM | POA: Diagnosis present

## 2015-08-01 DIAGNOSIS — I10 Essential (primary) hypertension: Secondary | ICD-10-CM | POA: Diagnosis present

## 2015-08-01 DIAGNOSIS — Z880 Allergy status to penicillin: Secondary | ICD-10-CM

## 2015-08-01 DIAGNOSIS — Z886 Allergy status to analgesic agent status: Secondary | ICD-10-CM

## 2015-08-01 DIAGNOSIS — Z823 Family history of stroke: Secondary | ICD-10-CM

## 2015-08-01 DIAGNOSIS — I200 Unstable angina: Secondary | ICD-10-CM

## 2015-08-01 LAB — BASIC METABOLIC PANEL
ANION GAP: 9 (ref 5–15)
BUN: 15 mg/dL (ref 6–20)
CALCIUM: 9.8 mg/dL (ref 8.9–10.3)
CO2: 25 mmol/L (ref 22–32)
Chloride: 102 mmol/L (ref 101–111)
Creatinine, Ser: 1.36 mg/dL — ABNORMAL HIGH (ref 0.44–1.00)
GFR, EST AFRICAN AMERICAN: 41 mL/min — AB (ref >60)
GFR, EST NON AFRICAN AMERICAN: 36 mL/min — AB (ref >60)
Glucose, Bld: 205 mg/dL — ABNORMAL HIGH (ref 65–99)
Potassium: 2.8 mmol/L — ABNORMAL LOW (ref 3.5–5.1)
SODIUM: 136 mmol/L (ref 135–145)

## 2015-08-01 LAB — TROPONIN I: TROPONIN I: 0.13 ng/mL — AB (ref <0.03)

## 2015-08-01 LAB — CBC
HCT: 44.3 % (ref 36.0–46.0)
HEMOGLOBIN: 14.7 g/dL (ref 12.0–15.0)
MCH: 26.4 pg (ref 26.0–34.0)
MCHC: 33.2 g/dL (ref 30.0–36.0)
MCV: 79.7 fL (ref 78.0–100.0)
Platelets: 221 10*3/uL (ref 150–400)
RBC: 5.56 MIL/uL — AB (ref 3.87–5.11)
RDW: 15.4 % (ref 11.5–15.5)
WBC: 10.2 10*3/uL (ref 4.0–10.5)

## 2015-08-01 MED ORDER — ASPIRIN 81 MG PO CHEW
324.0000 mg | CHEWABLE_TABLET | Freq: Once | ORAL | Status: AC
  Administered 2015-08-01: 324 mg via ORAL
  Filled 2015-08-01: qty 4

## 2015-08-01 MED ORDER — NITROGLYCERIN 0.4 MG SL SUBL
0.4000 mg | SUBLINGUAL_TABLET | SUBLINGUAL | Status: DC | PRN
  Administered 2015-08-01 – 2015-08-02 (×3): 0.4 mg via SUBLINGUAL
  Filled 2015-08-01: qty 1

## 2015-08-01 MED ORDER — HEPARIN BOLUS VIA INFUSION
2000.0000 [IU] | Freq: Once | INTRAVENOUS | Status: AC
  Administered 2015-08-01: 2000 [IU] via INTRAVENOUS
  Filled 2015-08-01: qty 2000

## 2015-08-01 MED ORDER — HEPARIN (PORCINE) IN NACL 100-0.45 UNIT/ML-% IJ SOLN
1000.0000 [IU]/h | INTRAMUSCULAR | Status: DC
  Administered 2015-08-01: 1000 [IU]/h via INTRAVENOUS
  Filled 2015-08-01: qty 250

## 2015-08-01 NOTE — Progress Notes (Signed)
ANTICOAGULATION CONSULT NOTE - Initial Consult  Pharmacy Consult for heparin Indication: chest pain/ACS  No Known Allergies  Patient Measurements: Height: 5\' 4"  (162.6 cm) Weight: 189 lb (85.73 kg) IBW/kg (Calculated) : 54.7 Heparin Dosing Weight: 75kg  Vital Signs: Temp: 98.5 F (36.9 C) (07/14 2044) Temp Source: Oral (07/14 2044) BP: 135/79 mmHg (07/14 2044) Pulse Rate: 81 (07/14 2044)  Labs:  Recent Labs  08/01/15 2051  HGB 14.7  HCT 44.3  PLT 221  CREATININE 1.36*  TROPONINI 0.13*    Estimated Creatinine Clearance: 34.9 mL/min (by C-G formula based on Cr of 1.36).   Medical History: Past Medical History  Diagnosis Date  . Asthma   . COPD (chronic obstructive pulmonary disease) ([place])   . Hypertension   . Hyperlipidemia   . Diabetes mellitus   . CKD (chronic kidney disease), stage II     Assessment: 82yo female w/ admission <60mo ago for acute left thalamic infarct now c/o "burning" CP that radiates to right back, troponin found to be elevated, to begin heparin.  Goal of Therapy:  Heparin level 0.3-0.7 units/ml Monitor platelets by anticoagulation protocol: Yes   Plan:  Will give small heparin bolus of 2000 units/hr followed by gtt at 1000 units/hr and monitor heparin levels and CBC.  Wynona Neat, PharmD, BCPS  08/01/2015,10:41 PM

## 2015-08-01 NOTE — ED Notes (Signed)
Pt states she has had "heart burn" for one week now. Pt states she has a burning in her chest and under her left breast. Daughter reports belching. Pt states the burning sensation also goes into her right back. Pt denies any sob.

## 2015-08-01 NOTE — ED Provider Notes (Signed)
CSN: [id number]     Arrival date & time 08/01/15  2028 History   First MD Initiated Contact with Patient 08/01/15 2223     Chief Complaint  Patient presents with  . Heartburn     (Consider location/radiation/quality/duration/timing/severity/associated sxs/prior Treatment) Patient is a 82 y.o. female presenting with chest pain. The history is provided by the patient.  Chest Pain Pain location:  L chest Pain quality: pressure   Pain radiates to:  Does not radiate Pain severity:  Moderate Onset quality:  Gradual Duration:  5 days Timing:  Intermittent Progression:  Waxing and waning Chronicity:  New Context: at rest   Relieved by:  Nothing Worsened by:  Nothing tried Ineffective treatments:  Antacids Associated symptoms: no abdominal pain, no diaphoresis, no fever, no lower extremity edema and no shortness of breath   Risk factors: diabetes mellitus, high cholesterol, hypertension and obesity     Past Medical History  Diagnosis Date  . Asthma   . COPD (chronic obstructive pulmonary disease) ([place])   . Hypertension   . Hyperlipidemia   . Diabetes mellitus   . CKD (chronic kidney disease), stage II    History reviewed. No pertinent past surgical history. No family history on file. Social History  Substance Use Topics  . Smoking status: Former Research scientist (life sciences)  . Smokeless tobacco: None  . Alcohol Use: None   OB History    No data available     Review of Systems  Constitutional: Negative for fever and diaphoresis.  Respiratory: Negative for shortness of breath.   Cardiovascular: Positive for chest pain.  Gastrointestinal: Negative for abdominal pain.  All other systems reviewed and are negative.     Allergies  Review of patient's allergies indicates no known allergies.  Home Medications   Prior to Admission medications   Medication Sig Start Date End Date Taking? Authorizing Provider  albuterol (PROAIR HFA) 108 (90 Base) MCG/ACT inhaler Inhale 2 puffs into the lungs  every 6 (six) hours as needed for wheezing or shortness of breath.    Historical Provider, MD  aspirin 81 MG tablet Take 1 tablet (81 mg total) by mouth daily. 07/12/15   [name], MD  atorvastatin (LIPITOR) 40 MG tablet Take 1 tablet (40 mg total) by mouth daily at 6 PM. 07/13/15   [name], MD  colchicine (COLCRYS) 0.6 MG tablet Take 0.6 mg by mouth 2 (two) times daily as needed (for gout symptoms).    Historical Provider, MD  gabapentin (NEURONTIN) 300 MG capsule Take 300 mg by mouth daily.    Historical Provider, MD  insulin glargine (LANTUS) 100 UNIT/ML injection Inject 50-60 Units into the skin 2 (two) times daily. 60 units in the morning and 50 units at bedtime    Historical Provider, MD  linagliptin (TRADJENTA) 5 MG TABS tablet Take 5 mg by mouth daily.    Historical Provider, MD  olmesartan-hydrochlorothiazide (BENICAR HCT) 40-25 MG per tablet Take 1 tablet by mouth daily.      Historical Provider, MD  omeprazole (PRILOSEC) 20 MG capsule Take 20 mg by mouth daily as needed (for heartburn).     Historical Provider, MD  traMADol (ULTRAM) 50 MG tablet Take 50 mg by mouth 2 (two) times daily as needed for moderate pain.    Historical Provider, MD   BP 135/79 mmHg  Pulse 81  Temp(Src) 98.5 F (36.9 C) (Oral)  Resp 16  Ht 5\' 4"  (1.626 m)  Wt 189 lb (85.73 kg)  BMI 32.43 kg/m2  SpO2  98% Physical Exam  Constitutional: She is oriented to person, place, and time. She appears well-developed and well-nourished. No distress.  HENT:  Head: Normocephalic.  Eyes: Conjunctivae are normal.  Neck: Neck supple. No tracheal deviation present.  Cardiovascular: Normal rate, regular rhythm and normal heart sounds.   Pulmonary/Chest: Effort normal. No respiratory distress. She exhibits tenderness (left lower).  Abdominal: Soft. She exhibits no distension. There is no tenderness. There is no rebound.  Neurological: She is alert and oriented to person, place, and time.  Skin: Skin is warm and dry.   Psychiatric: She has a normal mood and affect.  Vitals reviewed.   ED Course  Procedures (including critical care time) Labs Review Labs Reviewed  BASIC METABOLIC PANEL - Abnormal; Notable for the following:    Potassium 2.8 (*)    Glucose, Bld 205 (*)    Creatinine, Ser 1.36 (*)    GFR calc non Af Amer 36 (*)    GFR calc Af Amer 41 (*)    All other components within normal limits  CBC - Abnormal; Notable for the following:    RBC 5.56 (*)    All other components within normal limits  TROPONIN I - Abnormal; Notable for the following:    Troponin I 0.13 (*)    All other components within normal limits    Imaging Review Dg Chest 2 View  08/01/2015  CLINICAL DATA:  Heartburn for 1 week. Burning in chest and under left breast. EXAM: CHEST  2 VIEW COMPARISON:  Two-view chest x-ray 07/11/2015. FINDINGS: Heart size is normal. Lung volumes are low. No focal airspace disease present. There is no edema or effusion to suggest failure. The visualized soft tissues and bony thorax are unremarkable. IMPRESSION: 1. Low lung volumes. 2. No acute cardiopulmonary disease. Electronically Signed   By: San Morelle M.D.   On: 08/01/2015 21:16   I have personally reviewed and evaluated these images and lab results as part of my medical decision-making.   EKG Interpretation   Date/Time:  Friday August 01 2015 20:39:30 EDT Ventricular Rate:  79 PR Interval:  180 QRS Duration: 104 QT Interval:  398 QTC Calculation: 456 R Axis:   -83 Text Interpretation:  Normal sinus rhythm Left axis deviation Possible  Anterior infarct , age undetermined Abnormal ECG No significant change  since last tracing Confirmed by Reyn Faivre MD, Craig Ionescu [phone]) on 08/01/2015  10:23:05 PM      MDM   Final diagnoses:  Unstable angina (HCC)  NSTEMI (non-ST elevated myocardial infarction) [facility])    82 y.o. female presents with intermittent chest pressure she feels like is "gas" over the last 5 days that has been coming  and going at rest under her left breast. Recent admission for ischemic stroke. Has multiple risk factors for ACS. No change on EKG from prior, no neurologic symptoms on this visit. Pattern is concerning for UA/NSTEMI, asa and heparin ordered as well as ntg for pain. D/w o/c cardiology who will see Pt in ED for admission and further management.    Leo Grosser, MD 08/02/15 [phone]

## 2015-08-02 ENCOUNTER — Observation Stay (HOSPITAL_COMMUNITY): Payer: Medicare Other

## 2015-08-02 ENCOUNTER — Encounter (HOSPITAL_COMMUNITY): Payer: Self-pay | Admitting: Internal Medicine

## 2015-08-02 DIAGNOSIS — K219 Gastro-esophageal reflux disease without esophagitis: Secondary | ICD-10-CM | POA: Diagnosis not present

## 2015-08-02 DIAGNOSIS — I1310 Hypertensive heart and chronic kidney disease without heart failure, with stage 1 through stage 4 chronic kidney disease, or unspecified chronic kidney disease: Secondary | ICD-10-CM | POA: Diagnosis not present

## 2015-08-02 DIAGNOSIS — N183 Chronic kidney disease, stage 3 (moderate): Secondary | ICD-10-CM

## 2015-08-02 DIAGNOSIS — J410 Simple chronic bronchitis: Secondary | ICD-10-CM | POA: Diagnosis not present

## 2015-08-02 DIAGNOSIS — I214 Non-ST elevation (NSTEMI) myocardial infarction: Secondary | ICD-10-CM | POA: Diagnosis not present

## 2015-08-02 DIAGNOSIS — I700 Atherosclerosis of aorta: Secondary | ICD-10-CM | POA: Diagnosis not present

## 2015-08-02 DIAGNOSIS — R079 Chest pain, unspecified: Secondary | ICD-10-CM | POA: Diagnosis present

## 2015-08-02 DIAGNOSIS — E876 Hypokalemia: Secondary | ICD-10-CM | POA: Diagnosis not present

## 2015-08-02 DIAGNOSIS — I10 Essential (primary) hypertension: Secondary | ICD-10-CM

## 2015-08-02 DIAGNOSIS — Z6828 Body mass index (BMI) 28.0-28.9, adult: Secondary | ICD-10-CM | POA: Diagnosis not present

## 2015-08-02 DIAGNOSIS — J449 Chronic obstructive pulmonary disease, unspecified: Secondary | ICD-10-CM | POA: Diagnosis not present

## 2015-08-02 DIAGNOSIS — R7989 Other specified abnormal findings of blood chemistry: Secondary | ICD-10-CM

## 2015-08-02 DIAGNOSIS — R1013 Epigastric pain: Secondary | ICD-10-CM | POA: Diagnosis present

## 2015-08-02 DIAGNOSIS — Z66 Do not resuscitate: Secondary | ICD-10-CM | POA: Diagnosis not present

## 2015-08-02 DIAGNOSIS — E1122 Type 2 diabetes mellitus with diabetic chronic kidney disease: Secondary | ICD-10-CM

## 2015-08-02 DIAGNOSIS — E785 Hyperlipidemia, unspecified: Secondary | ICD-10-CM | POA: Diagnosis not present

## 2015-08-02 DIAGNOSIS — Z7982 Long term (current) use of aspirin: Secondary | ICD-10-CM | POA: Diagnosis not present

## 2015-08-02 DIAGNOSIS — R778 Other specified abnormalities of plasma proteins: Secondary | ICD-10-CM | POA: Insufficient documentation

## 2015-08-02 DIAGNOSIS — E1165 Type 2 diabetes mellitus with hyperglycemia: Secondary | ICD-10-CM | POA: Diagnosis not present

## 2015-08-02 DIAGNOSIS — Z8673 Personal history of transient ischemic attack (TIA), and cerebral infarction without residual deficits: Secondary | ICD-10-CM | POA: Diagnosis not present

## 2015-08-02 DIAGNOSIS — Z794 Long term (current) use of insulin: Secondary | ICD-10-CM

## 2015-08-02 DIAGNOSIS — Z87891 Personal history of nicotine dependence: Secondary | ICD-10-CM | POA: Diagnosis not present

## 2015-08-02 DIAGNOSIS — E669 Obesity, unspecified: Secondary | ICD-10-CM | POA: Diagnosis not present

## 2015-08-02 DIAGNOSIS — H9190 Unspecified hearing loss, unspecified ear: Secondary | ICD-10-CM | POA: Diagnosis not present

## 2015-08-02 DIAGNOSIS — Z833 Family history of diabetes mellitus: Secondary | ICD-10-CM | POA: Diagnosis not present

## 2015-08-02 LAB — GLUCOSE, CAPILLARY
GLUCOSE-CAPILLARY: 132 mg/dL — AB (ref 65–99)
GLUCOSE-CAPILLARY: 212 mg/dL — AB (ref 65–99)
GLUCOSE-CAPILLARY: 202 mg/dL — AB (ref 65–99)
GLUCOSE-CAPILLARY: 21 mg/dL — AB (ref 65–99)
Glucose-Capillary: 157 mg/dL — ABNORMAL HIGH (ref 65–99)
Glucose-Capillary: 207 mg/dL — ABNORMAL HIGH (ref 65–99)

## 2015-08-02 LAB — MRSA PCR SCREENING: MRSA BY PCR: NEGATIVE

## 2015-08-02 LAB — HEPATIC FUNCTION PANEL
ALK PHOS: 84 U/L (ref 38–126)
ALT: 24 U/L (ref 14–54)
AST: 22 U/L (ref 15–41)
Albumin: 3.9 g/dL (ref 3.5–5.0)
BILIRUBIN INDIRECT: 0.4 mg/dL (ref 0.3–0.9)
Bilirubin, Direct: 0.1 mg/dL (ref 0.1–0.5)
TOTAL PROTEIN: 7.4 g/dL (ref 6.5–8.1)
Total Bilirubin: 0.5 mg/dL (ref 0.3–1.2)

## 2015-08-02 LAB — PHOSPHORUS: PHOSPHORUS: 2.7 mg/dL (ref 2.5–4.6)

## 2015-08-02 LAB — LACTIC ACID, PLASMA: Lactic Acid, Venous: 1.8 mmol/L (ref 0.5–1.9)

## 2015-08-02 LAB — MAGNESIUM: Magnesium: 1.8 mg/dL (ref 1.7–2.4)

## 2015-08-02 LAB — TROPONIN I
TROPONIN I: 0.11 ng/mL — AB (ref <0.03)
Troponin I: 0.16 ng/mL (ref <0.03)

## 2015-08-02 LAB — LIPASE, BLOOD
LIPASE: 56 U/L — AB (ref 11–51)
Lipase: 80 U/L — ABNORMAL HIGH (ref 11–51)

## 2015-08-02 MED ORDER — SUCRALFATE 1 GM/10ML PO SUSP
1.0000 g | Freq: Once | ORAL | Status: AC
  Administered 2015-08-02: 1 g via ORAL
  Filled 2015-08-02: qty 10

## 2015-08-02 MED ORDER — SODIUM CHLORIDE 0.9 % IV SOLN
INTRAVENOUS | Status: AC
  Administered 2015-08-02: 03:00:00 via INTRAVENOUS

## 2015-08-02 MED ORDER — MORPHINE SULFATE (PF) 2 MG/ML IV SOLN: 2.0000 mg | INTRAVENOUS | Status: DC | PRN

## 2015-08-02 MED ORDER — ENSURE ENLIVE PO LIQD: 237.0000 mL | Freq: Two times a day (BID) | ORAL | Status: DC

## 2015-08-02 MED ORDER — ASPIRIN 81 MG PO CHEW
81.0000 mg | CHEWABLE_TABLET | Freq: Every day | ORAL | Status: DC
  Administered 2015-08-02 – 2015-08-05 (×4): 81 mg via ORAL
  Filled 2015-08-02 (×4): qty 1

## 2015-08-02 MED ORDER — PANTOPRAZOLE SODIUM 40 MG PO TBEC
40.0000 mg | DELAYED_RELEASE_TABLET | Freq: Two times a day (BID) | ORAL | Status: DC
  Administered 2015-08-02 – 2015-08-05 (×6): 40 mg via ORAL
  Filled 2015-08-02 (×6): qty 1

## 2015-08-02 MED ORDER — PANTOPRAZOLE SODIUM 40 MG PO TBEC
40.0000 mg | DELAYED_RELEASE_TABLET | Freq: Every day | ORAL | Status: DC
  Administered 2015-08-02: 40 mg via ORAL
  Filled 2015-08-02: qty 1

## 2015-08-02 MED ORDER — COLCHICINE 0.6 MG PO TABS
0.6000 mg | ORAL_TABLET | Freq: Two times a day (BID) | ORAL | Status: DC | PRN
Start: 2015-08-02 — End: 2015-08-05

## 2015-08-02 MED ORDER — ONDANSETRON HCL 4 MG/2ML IJ SOLN: 4.0000 mg | Freq: Four times a day (QID) | INTRAMUSCULAR | Status: DC | PRN

## 2015-08-02 MED ORDER — GABAPENTIN 300 MG PO CAPS
300.0000 mg | ORAL_CAPSULE | Freq: Three times a day (TID) | ORAL | Status: DC
Start: 2015-08-02 — End: 2015-08-05
  Administered 2015-08-02 – 2015-08-05 (×10): 300 mg via ORAL
  Filled 2015-08-02 (×10): qty 1

## 2015-08-02 MED ORDER — ATORVASTATIN CALCIUM 40 MG PO TABS
40.0000 mg | ORAL_TABLET | Freq: Every day | ORAL | Status: DC
  Administered 2015-08-02 – 2015-08-04 (×3): 40 mg via ORAL
  Filled 2015-08-02 (×3): qty 1

## 2015-08-02 MED ORDER — POTASSIUM CHLORIDE CRYS ER 20 MEQ PO TBCR
60.0000 meq | EXTENDED_RELEASE_TABLET | Freq: Once | ORAL | Status: AC
  Administered 2015-08-02: 60 meq via ORAL
  Filled 2015-08-02: qty 3

## 2015-08-02 MED ORDER — ACETAMINOPHEN 325 MG PO TABS
650.0000 mg | ORAL_TABLET | ORAL | Status: DC | PRN
  Administered 2015-08-03: 650 mg via ORAL
  Filled 2015-08-02: qty 2

## 2015-08-02 MED ORDER — DIATRIZOATE MEGLUMINE & SODIUM 66-10 % PO SOLN
ORAL | Status: AC
  Administered 2015-08-02: 03:00:00
  Filled 2015-08-02: qty 30

## 2015-08-02 MED ORDER — CARVEDILOL 6.25 MG PO TABS
6.2500 mg | ORAL_TABLET | Freq: Two times a day (BID) | ORAL | Status: DC
  Administered 2015-08-02 – 2015-08-03 (×2): 6.25 mg via ORAL
  Filled 2015-08-02 (×2): qty 1

## 2015-08-02 MED ORDER — INSULIN ASPART 100 UNIT/ML ~~LOC~~ SOLN
0.0000 [IU] | SUBCUTANEOUS | Status: DC
  Administered 2015-08-02: 3 [IU] via SUBCUTANEOUS
  Administered 2015-08-02: 2 [IU] via SUBCUTANEOUS
  Administered 2015-08-02: 1 [IU] via SUBCUTANEOUS
  Administered 2015-08-02: 3 [IU] via SUBCUTANEOUS
  Administered 2015-08-03 (×4): 2 [IU] via SUBCUTANEOUS
  Administered 2015-08-03: 1 [IU] via SUBCUTANEOUS

## 2015-08-02 MED ORDER — POTASSIUM CHLORIDE 10 MEQ/100ML IV SOLN
10.0000 meq | INTRAVENOUS | Status: AC
  Administered 2015-08-02 (×2): 10 meq via INTRAVENOUS
  Filled 2015-08-02 (×2): qty 100

## 2015-08-02 MED ORDER — INSULIN GLARGINE 100 UNIT/ML ~~LOC~~ SOLN
30.0000 [IU] | Freq: Two times a day (BID) | SUBCUTANEOUS | Status: DC
  Administered 2015-08-02 – 2015-08-05 (×6): 30 [IU] via SUBCUTANEOUS
  Filled 2015-08-02 (×10): qty 0.3

## 2015-08-02 MED ORDER — RANITIDINE HCL 150 MG/10ML PO SYRP
300.0000 mg | ORAL_SOLUTION | Freq: Once | ORAL | Status: AC
  Administered 2015-08-02: 300 mg via ORAL
  Filled 2015-08-02: qty 20

## 2015-08-02 MED ORDER — TRAMADOL HCL 50 MG PO TABS: 50.0000 mg | ORAL_TABLET | Freq: Two times a day (BID) | ORAL | Status: DC | PRN

## 2015-08-02 MED ORDER — ALBUTEROL SULFATE (2.5 MG/3ML) 0.083% IN NEBU: 3.0000 mL | INHALATION_SOLUTION | Freq: Four times a day (QID) | RESPIRATORY_TRACT | Status: DC | PRN

## 2015-08-02 NOTE — ED Notes (Signed)
Admitting MD at the bedside.  

## 2015-08-02 NOTE — Progress Notes (Signed)
Pt CBG 21 on glucometer, CBG immediately repeated with result 207.  Pt with no s/s hypoglycemia.  Pt fed and medications administered per orders.  Will continue to monitor.

## 2015-08-02 NOTE — Progress Notes (Signed)
Subjective:  Admitted with burning left chest pain as well as epigastric pain associated with epigastric tenderness.  She had a recent admission for a thalamic stroke in June.  She is very hard of hearing and is a very poor historian.  No definite complaints of chest pain today.  He has mild elevations of troponin with a normal EKG.  Objective:  Vital Signs in the last 24 hours: BP 136/50 mmHg  Pulse 63  Temp(Src) 97.6 F (36.4 C) (Oral)  Resp 15  Ht 5\' 4"  (1.626 m)  Wt 74.798 kg (164 lb 14.4 oz)  BMI 28.29 kg/m2  SpO2 98%  Physical Exam: Elderly black female very hard of hearing currently in no acute distress Lungs:  Clear Cardiac:  Regular rhythm, normal S1 and S2, no S3 Abdomen: Mild diffuse tenderness without rebound Extremities:  No edema present  Intake/Output from previous day:    Weight Filed Weights   08/01/15 2044 08/02/15 0216  Weight: 85.73 kg (189 lb) 74.798 kg (164 lb 14.4 oz)    Lab Results: Basic Metabolic Panel:  Recent Labs  08/01/15 2051  NA 136  K 2.8*  CL 102  CO2 25  GLUCOSE 205*  BUN 15  CREATININE 1.36*   CBC:  Recent Labs  08/01/15 2051  WBC 10.2  HGB 14.7  HCT 44.3  MCV 79.7  PLT 221    Cardiac Panel (last 3 results)  Recent Labs  08/01/15 2051 08/02/15 0225 08/02/15 0624  TROPONINI 0.13* 0.16* 0.11*    Telemetry: Normal sinus rhythm  Assessment/Plan:  1.  Continued abdominal tenderness and abdominal pain that has improved somewhat 2.  Low level elevation of troponin uncertain whether this is ischemia due to demand-the clinical presentation is atypical 3.  Hypertensive heart disease 4.  Insulin-dependent diabetes mellitus 5.  Recent thalamic infarction 6.  Abdominal aortic atherosclerosis 7.  Hypokalemia severe on admission  Recommendations:  She had a recent admission with an acute stroke.  She has low-level elevations of troponin-unclear the significance of this at this time.  Her clinical presentation is  more abdominal and she still has abdominal tenderness.  I would favor continued workup of her abdominal symptoms and observation.  Okay to continue heparin for another 24 hours.  May be best just to treat intensively medically until things sort out.  I don't really see a role for stress testing here.  Once things are sorted out will either need to decide whether just sent home on intensified medical therapy or to do catheterization.     Kerry Hough  MD [facility] Cardiology  08/02/2015, 8:08 AM

## 2015-08-02 NOTE — Progress Notes (Signed)
Patient seen and examined. Admitted after midnight with complaints of epigastric pain and chest pain. Very atypical presentation for cardiac issues. Her pain is now resolved and EKG/telemetry do not demonstrate any signs of ischemia. Patient denies nausea, vomiting, diaphoresis and palpitations. Hemodynamically stable. Please refer to H&P written by Dr. Roel Cluck for further info/details on admission. Of note; her troponin return to be elevated, but trending down.  Plan: -will start coreg BID, statins and ASA -PPI twice a day -cardiology has seen patient and recommended 24 hours of heparin -will follow clinical response -will slowly advance diet   [name] [id number]

## 2015-08-02 NOTE — Consult Note (Signed)
Referring Physician: Dr. Laneta Simmers Primary Physician: Primary Cardiologist: Reason for Consultation: "e;evated trop"   HPI: 82 y/o african american woman with pmh of htn, hlp, DM, copd, ckd  recent ischemic left thalamic infarct and discharged from hospital last week presented to ED with complaints of left sided upper quadrant and epigastric pain. In ED workup showed mildly elevated trop of 0.13 and cardiology was consulted. Patient is a poor historian. She was treated with asa and heparin drip. The pain started yesterday. On my interview she was in moderate discomfort and tender in left upper quadrant and epigastrium without any relief. Her prior ekg has baseline artifact but current ekg is unchanged from prior without any ischemic changes. Her labs also showed mildly elevated lipase. She is admitted on hospitalist service and cardiology consulted.  No prior cad or know MIs per patient.  abd pain + Review of Systems:     Cardiac Review of Systems: {Y] = yes [ ]  = no  Chest Pain [    ]  Resting SOB [   ] Exertional SOB  [  ]  Orthopnea [  ]   Pedal Edema [   ]    Palpitations [  ] Syncope  [  ]   Presyncope [   ]  General Review of Systems: [Y] = yes [  ]=no Constitional: recent weight change [  ]; anorexia [  ]; fatigue [  ]; nausea [  ]; night sweats [  ]; fever [  ]; or chills [  ];                                                                     Eyes : blurred vision [  ]; diplopia [   ]; vision changes [  ];  Amaurosis fugax[  ]; Resp: cough [  ];  wheezing[  ];  hemoptysis[  ];  PND [  ];  GI:  gallstones[  ], vomiting[  ];  dysphagia[  ]; melena[  ];  hematochezia [  ]; heartburn[  ];   GU: kidney stones [  ]; hematuria[  ];   dysuria [  ];  nocturia[  ]; incontinence [  ];             Skin: rash, swelling[  ];, hair loss[  ];  peripheral edema[  ];  or itching[  ]; Musculosketetal: myalgias[  ];  joint swelling[  ];  joint erythema[  ];  joint pain[  ];  back pain[   ];  Heme/Lymph: bruising[  ];  bleeding[  ];  anemia[  ];  Neuro: TIA[  ];  headaches[  ];  stroke[  ];  vertigo[  ];  seizures[  ];   paresthesias[  ];  difficulty walking[  ];  Psych:depression[  ]; anxiety[  ];  Endocrine: diabetes[  ];  thyroid dysfunction[  ];  Other:  Past Medical History  Diagnosis Date  . Asthma   . COPD (chronic obstructive pulmonary disease) ([place])   . Hypertension   . Hyperlipidemia   . Diabetes mellitus   . CKD (chronic kidney disease), stage II      (Not in a hospital admission) No current facility-administered medications on file prior  to encounter.   Current Outpatient Prescriptions on File Prior to Encounter  Medication Sig Dispense Refill  . albuterol (PROAIR HFA) 108 (90 Base) MCG/ACT inhaler Inhale 2 puffs into the lungs every 6 (six) hours as needed for wheezing or shortness of breath.    Marland Kitchen aspirin 81 MG tablet Take 1 tablet (81 mg total) by mouth daily. 30 tablet 1  . atorvastatin (LIPITOR) 40 MG tablet Take 1 tablet (40 mg total) by mouth daily at 6 PM. 30 tablet 1  . colchicine (COLCRYS) 0.6 MG tablet Take 0.6 mg by mouth 2 (two) times daily as needed (for gout symptoms).    . gabapentin (NEURONTIN) 300 MG capsule Take 300 mg by mouth 3 (three) times daily.     . insulin glargine (LANTUS) 100 UNIT/ML injection Inject 50-60 Units into the skin 2 (two) times daily. 60 units in the morning and 50 units at bedtime    . linagliptin (TRADJENTA) 5 MG TABS tablet Take 5 mg by mouth daily.    Marland Kitchen olmesartan-hydrochlorothiazide (BENICAR HCT) 40-25 MG per tablet Take 1 tablet by mouth daily.      Marland Kitchen omeprazole (PRILOSEC) 20 MG capsule Take 20 mg by mouth daily as needed (for heartburn).     . traMADol (ULTRAM) 50 MG tablet Take 50 mg by mouth 2 (two) times daily as needed for moderate pain.       . potassium chloride SA  60 mEq Oral Once    Infusions: . heparin 1,000 Units/hr (08/01/15 2318)    Allergies  Allergen Reactions  . Codeine Rash  .  Darvon [Propoxyphene] Rash  . Penicillins Rash    Social History   Social History  . Marital Status: Widowed    Spouse Name: N/A  . Number of Children: N/A  . Years of Education: N/A   Occupational History  . Not on file.   Social History Main Topics  . Smoking status: Former Research scientist (life sciences)  . Smokeless tobacco: Not on file  . Alcohol Use: Not on file  . Drug Use: Not on file  . Sexual Activity: Not on file   Other Topics Concern  . Not on file   Social History Narrative    No family history on file.  PHYSICAL EXAM: Filed Vitals:   08/01/15 2245 08/01/15 2300  BP: 142/80 122/109  Pulse: 78   Temp:    Resp: 19 15    No intake or output data in the 24 hours ending 08/02/15 0019  General:  Well appearing. No respiratory difficulty HEENT: normal Neck: supple. no JVD. Carotids 2+ bilat; no bruits. No lymphadenopathy or thryomegaly appreciated. Cor: PMI nondisplaced. Regular rate & rhythm. No rubs, gallops or murmurs. Lungs: clear Abdomen: soft,epigastric tenderness + Extremities: no cyanosis, clubbing, rash, edema Neuro: alert & oriented x 3, cranial nerves grossly intact. moves all 4 extremities w/o difficulty. Affect pleasant.  ECG:  Results for orders placed or performed during the hospital encounter of 08/01/15 (from the past 24 hour(s))  Basic metabolic panel     Status: Abnormal   Collection Time: 08/01/15  8:51 PM  Result Value Ref Range   Sodium 136 135 - 145 mmol/L   Potassium 2.8 (L) 3.5 - 5.1 mmol/L   Chloride 102 101 - 111 mmol/L   CO2 25 22 - 32 mmol/L   Glucose, Bld 205 (H) 65 - 99 mg/dL   BUN 15 6 - 20 mg/dL   Creatinine, Ser 1.36 (H) 0.44 - 1.00 mg/dL   Calcium  9.8 8.9 - 10.3 mg/dL   GFR calc non Af Amer 36 (L) >60 mL/min   GFR calc Af Amer 41 (L) >60 mL/min   Anion gap 9 5 - 15  CBC     Status: Abnormal   Collection Time: 08/01/15  8:51 PM  Result Value Ref Range   WBC 10.2 4.0 - 10.5 K/uL   RBC 5.56 (H) 3.87 - 5.11 MIL/uL   Hemoglobin 14.7  12.0 - 15.0 g/dL   HCT 44.3 36.0 - 46.0 %   MCV 79.7 78.0 - 100.0 fL   MCH 26.4 26.0 - 34.0 pg   MCHC 33.2 30.0 - 36.0 g/dL   RDW 15.4 11.5 - 15.5 %   Platelets 221 150 - 400 K/uL  Troponin I     Status: Abnormal   Collection Time: 08/01/15  8:51 PM  Result Value Ref Range   Troponin I 0.13 (HH) <0.03 ng/mL   Dg Chest 2 View  08/01/2015  CLINICAL DATA:  Heartburn for 1 week. Burning in chest and under left breast. EXAM: CHEST  2 VIEW COMPARISON:  Two-view chest x-ray 07/11/2015. FINDINGS: Heart size is normal. Lung volumes are low. No focal airspace disease present. There is no edema or effusion to suggest failure. The visualized soft tissues and bony thorax are unremarkable. IMPRESSION: 1. Low lung volumes. 2. No acute cardiopulmonary disease. Electronically Signed   By: San Morelle M.D.   On: 08/01/2015 21:16   EKG, SR no acute changes   ASSESSMENT: 1. Mildly elevated trop in setting of multiple CAD risk factors. She likely has underlying CAD but her current presentation does not appear to be ACS and more likely an epigastric /abdominal etiology given significant tenderness. Once recovered she will need CAD workup likely a cardiac catheterization  PLAN/DISCUSSION: 1. Agree with ASA and heparin drip atleast 48 hours 2. Cycle trops and EKG 3. Echo for function 4. Statins, beta blockers 5. Left heart cath once stable from abd standpoint.  [place]

## 2015-08-02 NOTE — H&P (Signed)
Paula West [id number] DOB: November 16, 1932 DOA: 08/01/2015    PCP:  [facility]ists:none   Patient coming from:   home Lives   With family    Chief Complaint: chest pain/heartburn  HPI: Paula West is a 82 y.o. female with medical history significant of hypertension, diabetes, hyperlipidemia, C daily, COPD, left thalamic infarct in June 2017,     Presented with burning sensation in her chest and under left breast with increased belching radiating to right back. Not associate shortness of breath. States it feels like  gas. Has been going on for past 5 days has been coming and going patient presented to emergency department. Reports pain is better now.  Not worse with deep breaths. Denies  Vomiting but had mild nausea. She reports earlier epigastric abdominal pain but has not resolved   Regarding pertinent Chronic problems: Diabetes mellitus last hemoglobin A1c 8.6 on Lantus, echogram was done in June showed grade 1 diastolic dysfunction EF preserved History of chronic kidney disease baseline creatinine around 1.4  IN ER: Afebrile, heart rate 81, respirations 20, blood pressure 136/94 satting 88% room air initially now up to 98% WBC 10.2 hemoglobin 14.7 potassium 2.8 creatinine 1.36 which is up from base Troponin 0.13 Glucose 205 Lipase 80 CXR unremarkable Cardiology consult has been called patient was started on heparin drip She was also given Zantac and Carafate    Hospitalist was called for admission for non-ST elevation MI   Review of Systems:    Pertinent positives include: heartburn, indigestion, chest pain,   Constitutional:  No weight loss, night sweats, Fevers, chills, fatigue, weight loss  HEENT:  No headaches, Difficulty swallowing,Tooth/dental problems,Sore throat,  No sneezing, itching, ear ache, nasal congestion, post nasal drip,  Cardio-vascular:  No Orthopnea, PND, anasarca, dizziness, palpitations.no Bilateral lower extremity  swelling  GI:  No  abdominal pain, nausea, vomiting, diarrhea, change in bowel habits, loss of appetite, melena, blood in stool, hematemesis Resp:  no shortness of breath at rest. No dyspnea on exertion, No excess mucus, no productive cough, No non-productive cough, No coughing up of blood.No change in color of mucus.No wheezing. Skin:  no rash or lesions. No jaundice GU:  no dysuria, change in color of urine, no urgency or frequency. No straining to urinate.  No flank pain.  Musculoskeletal:  No joint pain or no joint swelling. No decreased range of motion. No back pain.  Psych:  No change in mood or affect. No depression or anxiety. No memory loss.  Neuro: no localizing neurological complaints, no tingling, no weakness, no double vision, no gait abnormality, no slurred speech, no confusion  As per HPI otherwise 10 point review of systems negative.   Past Medical History: Past Medical History  Diagnosis Date  . Asthma   . COPD (chronic obstructive pulmonary disease) ([place])   . Hypertension   . Hyperlipidemia   . Diabetes mellitus   . CKD (chronic kidney disease), stage II    History reviewed. No pertinent past surgical history.   Social History:  Ambulatory   independently or walker or cane      reports that she has quit smoking. She does not have any smokeless tobacco history on file. Her alcohol and drug histories are not on file.  Allergies:   Allergies  Allergen Reactions  . Codeine Rash  . Darvon [Propoxyphene] Rash  . Penicillins Rash       Family History:    Family History  Problem Relation Age of Onset  .  Stroke Mother   . Diabetes Sister   . CAD Neg Hx     Medications: Prior to Admission medications   Medication Sig Start Date End Date Taking? Authorizing Provider  albuterol (PROAIR HFA) 108 (90 Base) MCG/ACT inhaler Inhale 2 puffs into the lungs every 6 (six) hours as needed for wheezing or shortness of breath.   Yes Historical Provider, MD    aspirin 81 MG tablet Take 1 tablet (81 mg total) by mouth daily. 07/12/15  Yes [name], MD  atorvastatin (LIPITOR) 40 MG tablet Take 1 tablet (40 mg total) by mouth daily at 6 PM. 07/13/15  Yes [name], MD  colchicine (COLCRYS) 0.6 MG tablet Take 0.6 mg by mouth 2 (two) times daily as needed (for gout symptoms).   Yes Historical Provider, MD  gabapentin (NEURONTIN) 300 MG capsule Take 300 mg by mouth 3 (three) times daily.    Yes Historical Provider, MD  insulin glargine (LANTUS) 100 UNIT/ML injection Inject 50-60 Units into the skin 2 (two) times daily. 60 units in the morning and 50 units at bedtime   Yes Historical Provider, MD  linagliptin (TRADJENTA) 5 MG TABS tablet Take 5 mg by mouth daily.   Yes Historical Provider, MD  olmesartan-hydrochlorothiazide (BENICAR HCT) 40-25 MG per tablet Take 1 tablet by mouth daily.     Yes Historical Provider, MD  omeprazole (PRILOSEC) 20 MG capsule Take 20 mg by mouth daily as needed (for heartburn).    Yes Historical Provider, MD  traMADol (ULTRAM) 50 MG tablet Take 50 mg by mouth 2 (two) times daily as needed for moderate pain.   Yes Historical Provider, MD    Physical Exam: Patient Vitals for the past 24 hrs:  BP Temp Temp src Pulse Resp SpO2 Height Weight  08/02/15 0030 136/94 mmHg - - 81 20 98 % - -  08/02/15 0015 136/85 mmHg - - 82 23 93 % - -  08/01/15 2345 127/89 mmHg - - 86 21 92 % - -  08/01/15 2330 120/82 mmHg - - 92 22 (!) 88 % - -  08/01/15 2300 (!) 122/109 mmHg - - - 15 - - -  08/01/15 2245 142/80 mmHg - - 78 19 95 % - -  08/01/15 2044 135/79 mmHg 98.5 F (36.9 C) Oral 81 16 98 % 5\' 4"  (1.626 m) 85.73 kg (189 lb)    1. General:  in No Acute distress 2. Psychological: Alert and   Oriented 3. Head/ENT:    Dry Mucous Membranes                          Head Non traumatic, neck supple                           Poor Dentition 4. SKIN:  decreased Skin turgor,  Skin clean Dry and intact no rash 5. Heart: Regular rate and rhythm no  Murmur, Rub or gallop 6. Lungs:  Clear to auscultation bilaterally, no wheezes or crackles   7. Abdomen: Soft, Some epigastric tenderness, slightly distended, obese 8. Lower extremities: no clubbing, cyanosis, or edema 9. Neurologically Grossly intact, moving all 4 extremities equally 10. MSK: Normal range of motion, chest pain reproducible palpation   body mass index is 32.43 kg/(m^2).  Labs on Admission:   Labs on Admission: I have personally reviewed following labs and imaging studies  CBC:  Recent Labs Lab 08/01/15 2051  WBC  10.2  HGB 14.7  HCT 44.3  MCV 79.7  PLT [id number]   Basic Metabolic Panel:  Recent Labs Lab 08/01/15 2051  NA 136  K 2.8*  CL 102  CO2 25  GLUCOSE 205*  BUN 15  CREATININE 1.36*  CALCIUM 9.8   GFR: Estimated Creatinine Clearance: 34.9 mL/min (by C-G formula based on Cr of 1.36). Liver Function Tests:  Recent Labs Lab 08/01/15 2051  AST 22  ALT 24  ALKPHOS 84  BILITOT 0.5  PROT 7.4  ALBUMIN 3.9    Recent Labs Lab 08/01/15 2051  LIPASE 80*   No results for input(s): AMMONIA in the last 168 hours. Coagulation Profile: No results for input(s): INR, PROTIME in the last 168 hours. Cardiac Enzymes:  Recent Labs Lab 08/01/15 2051  TROPONINI 0.13*   BNP (last 3 results) No results for input(s): PROBNP in the last 8760 hours. HbA1C: No results for input(s): HGBA1C in the last 72 hours. CBG: No results for input(s): GLUCAP in the last 168 hours. Lipid Profile: No results for input(s): CHOL, HDL, LDLCALC, TRIG, CHOLHDL, LDLDIRECT in the last 72 hours. Thyroid Function Tests: No results for input(s): TSH, T4TOTAL, FREET4, T3FREE, THYROIDAB in the last 72 hours. Anemia Panel: No results for input(s): VITAMINB12, FOLATE, FERRITIN, TIBC, IRON, RETICCTPCT in the last 72 hours. Urine analysis:    Component Value Date/Time   COLORURINE YELLOW 07/12/2015 0122   APPEARANCEUR CLEAR 07/12/2015 0122   LABSPEC 1.010 07/12/2015 0122    PHURINE 6.5 07/12/2015 0122   GLUCOSEU NEGATIVE 07/12/2015 0122   HGBUR NEGATIVE 07/12/2015 0122   BILIRUBINUR NEGATIVE 07/12/2015 0122   KETONESUR NEGATIVE 07/12/2015 0122   PROTEINUR 30* 07/12/2015 0122   UROBILINOGEN 0.2 06/22/2010 1226   NITRITE NEGATIVE 07/12/2015 0122   LEUKOCYTESUR NEGATIVE 07/12/2015 0122   Sepsis Labs: @LABRCNTIP (procalcitonin:4,lacticidven:4) )No results found for this or any previous visit (from the past 240 hour(s)).     UA not obtained  Lab Results  Component Value Date   HGBA1C 8.6* 07/11/2015    Estimated Creatinine Clearance: 34.9 mL/min (by C-G formula based on Cr of 1.36).  BNP (last 3 results) No results for input(s): PROBNP in the last 8760 hours.   ECG REPORT  Independently reviewed Rate: 79  Rhythm: Normal sinus rhythm ST&T Change: No acute ischemic changes   QTC 456  Filed Weights   08/01/15 2044  Weight: 85.73 kg (189 lb)     Cultures:    Component Value Date/Time   SDES URINE, CLEAN CATCH 06/22/2010 1226   SPECREQUEST ADDED 0243 06/23/10 06/22/2010 1226   CULT  06/22/2010 1226    Multiple bacterial morphotypes present, none predominant. Suggest appropriate recollection if clinically indicated.   REPTSTATUS 06/24/2010 FINAL 06/22/2010 1226     Radiological Exams on Admission: Dg Chest 2 View  08/01/2015  CLINICAL DATA:  Heartburn for 1 week. Burning in chest and under left breast. EXAM: CHEST  2 VIEW COMPARISON:  Two-view chest x-ray 07/11/2015. FINDINGS: Heart size is normal. Lung volumes are low. No focal airspace disease present. There is no edema or effusion to suggest failure. The visualized soft tissues and bony thorax are unremarkable. IMPRESSION: 1. Low lung volumes. 2. No acute cardiopulmonary disease. Electronically Signed   By: San Morelle M.D.   On: 08/01/2015 21:16    Chart has been reviewed    Assessment/Plan  82 y.o. female with medical history significant of hypertension, diabetes,  hyperlipidemia, C daily, COPD, left thalamic infarct in June 2017 and admitted  for non-ST elevation MI  Present on Admission:  . Chest pain/NSTEMI -given elevated cardiac enzymes we'll continue heparin drip as recommended by cardiology, admitted to telemetry as currently chest pain-free. Appreciate cardiology consult will obtain echogram in the morning continue to cycle cardiac enzymes  . Chronic obstructive pulmonary disease ([place]) stable continue home medication  . CKD (chronic kidney disease), stage III currently slightly above baseline we'll continue to monitor and avoid renal toxic medication  . Essential hypertension   hold Benicar for tonight given slightly elevated creatinine above baseline a need for rehydration   epigastric abdominal pain associated slightly elevated lipase. We'll obtain CT of abdomen to father evaluate, would rehydrate keep nothing by mouth for now Diabetes mellitus order sliding scale hold by mouth medications continue Lantus but at reduced dose Other plan as per orders.  DVT prophylaxis:  Heparin drip  Code Status:   DNR/DNI   as per patient     Family Communication:   Family not  at  Bedside    Disposition Plan:     To home once workup is complete and patient is stable   Consults called: cardiology    Admission status:   obs    Level of care       tele     I have spent a total of 57 min on this admission    Lyndon Chenoweth 08/02/2015, 1:58 AM   [facility]  Pager [phone]   after 2 AM please page floor coverage PA If 7AM-7PM, please contact the day team taking care of the patient  [email or web address]  Password [email or web address]

## 2015-08-03 DIAGNOSIS — J449 Chronic obstructive pulmonary disease, unspecified: Secondary | ICD-10-CM | POA: Diagnosis not present

## 2015-08-03 DIAGNOSIS — E1122 Type 2 diabetes mellitus with diabetic chronic kidney disease: Secondary | ICD-10-CM | POA: Diagnosis not present

## 2015-08-03 DIAGNOSIS — N183 Chronic kidney disease, stage 3 (moderate): Secondary | ICD-10-CM | POA: Diagnosis not present

## 2015-08-03 DIAGNOSIS — E1165 Type 2 diabetes mellitus with hyperglycemia: Secondary | ICD-10-CM

## 2015-08-03 DIAGNOSIS — I214 Non-ST elevation (NSTEMI) myocardial infarction: Secondary | ICD-10-CM | POA: Diagnosis not present

## 2015-08-03 DIAGNOSIS — R1013 Epigastric pain: Secondary | ICD-10-CM | POA: Diagnosis not present

## 2015-08-03 DIAGNOSIS — I10 Essential (primary) hypertension: Secondary | ICD-10-CM | POA: Diagnosis not present

## 2015-08-03 DIAGNOSIS — R7989 Other specified abnormal findings of blood chemistry: Secondary | ICD-10-CM | POA: Diagnosis not present

## 2015-08-03 DIAGNOSIS — R079 Chest pain, unspecified: Secondary | ICD-10-CM | POA: Diagnosis not present

## 2015-08-03 LAB — GLUCOSE, CAPILLARY
GLUCOSE-CAPILLARY: 128 mg/dL — AB (ref 65–99)
GLUCOSE-CAPILLARY: 151 mg/dL — AB (ref 65–99)
GLUCOSE-CAPILLARY: 196 mg/dL — AB (ref 65–99)
GLUCOSE-CAPILLARY: 200 mg/dL — AB (ref 65–99)
Glucose-Capillary: 196 mg/dL — ABNORMAL HIGH (ref 65–99)
Glucose-Capillary: 151 mg/dL — ABNORMAL HIGH (ref 65–99)

## 2015-08-03 MED ORDER — SIMETHICONE 40 MG/0.6ML PO SUSP
40.0000 mg | Freq: Four times a day (QID) | ORAL | Status: DC | PRN
  Administered 2015-08-03 – 2015-08-04 (×2): 40 mg via ORAL
  Filled 2015-08-03 (×3): qty 0.6

## 2015-08-03 MED ORDER — INSULIN ASPART 100 UNIT/ML ~~LOC~~ SOLN
0.0000 [IU] | Freq: Three times a day (TID) | SUBCUTANEOUS | Status: DC
  Administered 2015-08-04: 3 [IU] via SUBCUTANEOUS
  Administered 2015-08-04: 2 [IU] via SUBCUTANEOUS
  Administered 2015-08-04: 1 [IU] via SUBCUTANEOUS
  Administered 2015-08-05: 2 [IU] via SUBCUTANEOUS

## 2015-08-03 MED ORDER — CLOPIDOGREL BISULFATE 75 MG PO TABS
75.0000 mg | ORAL_TABLET | Freq: Every day | ORAL | Status: DC
  Administered 2015-08-03 – 2015-08-05 (×3): 75 mg via ORAL
  Filled 2015-08-03 (×3): qty 1

## 2015-08-03 MED ORDER — INSULIN ASPART 100 UNIT/ML ~~LOC~~ SOLN: 0.0000 [IU] | Freq: Every day | SUBCUTANEOUS | Status: DC

## 2015-08-03 MED ORDER — CARVEDILOL 12.5 MG PO TABS
12.5000 mg | ORAL_TABLET | Freq: Two times a day (BID) | ORAL | Status: DC
  Administered 2015-08-03 – 2015-08-05 (×4): 12.5 mg via ORAL
  Filled 2015-08-03 (×4): qty 1

## 2015-08-03 NOTE — Progress Notes (Signed)
Subjective:  Very difficult history.  Patient is very hard of hearing and very difficult to get a good history on her.  Says that she came in with difficulty belching.  She cannot remember having chest pain.  Not currently having chest discomfort today.  Very difficult to know how active she has at home.  As stated yesterday had a stroke in June.  She holds her stomach and still has some epigastric tenderness.  Objective:  Vital Signs in the last 24 hours: BP 124/57 mmHg  Pulse 58  Temp(Src) 98 F (36.7 C) (Oral)  Resp 18  Ht 5\' 4"  (1.626 m)  Wt 75.116 kg (165 lb 9.6 oz)  BMI 28.41 kg/m2  SpO2 98%  Physical Exam: Elderly black female very hard of hearing currently in no acute distress Lungs:  Clear Cardiac:  Regular rhythm, normal S1 and S2, no S3 Abdomen: Mild diffuse tenderness without rebound, worsened epigastric area Extremities:  No edema present  Intake/Output from previous day: 07/15 0701 - 07/16 0700 In: 984.8 [P.O.:900; I.V.:84.8] Out: 350 [Urine:350]  Weight Filed Weights   08/01/15 2044 08/02/15 0216 08/03/15 0400  Weight: 85.73 kg (189 lb) 74.798 kg (164 lb 14.4 oz) 75.116 kg (165 lb 9.6 oz)    Lab Results: Basic Metabolic Panel:  Recent Labs  08/01/15 2051  NA 136  K 2.8*  CL 102  CO2 25  GLUCOSE 205*  BUN 15  CREATININE 1.36*   CBC:  Recent Labs  08/01/15 2051  WBC 10.2  HGB 14.7  HCT 44.3  MCV 79.7  PLT 221    Cardiac Panel (last 3 results)  Recent Labs  08/01/15 2051 08/02/15 0225 08/02/15 0624  TROPONINI 0.13* 0.16* 0.11*    Telemetry: Normal sinus rhythm  EKG today shows some T wave inversions in the lateral leads that are new  Assessment/Plan:  1.  Continued abdominal tenderness and abdominal pain 2.  Low level elevation of troponin uncertain whether this is ischemia due to demand-the clinical presentation is atypical but she has new EKG changes in the lateral leads from the admission EKG. 3.  Hypertensive heart  disease 4.  Insulin-dependent diabetes mellitus 5.  Recent thalamic infarction 6.  Abdominal aortic atherosclerosis 7.  Hypokalemia severe on admission  Recommendations:  Heparin was stopped due to lack of IV access and was not restarted.  Blood draw is difficult on her.  Her major issues right now seem to be abdominal pain although the new EKG changes and low level troponin suggests that this is also ischemia.  With a recent stroke around a month ago not thrilled about immediately proceeding with catheterization.  I would favor intensified medical therapy, adding Plavix and a dressing abdominal pain.  Increase beta blockers.  Follow-up EKG in the morning.  Correct hypokalemia.      Kerry Hough  MD [facility]  08/03/2015, 8:27 AM

## 2015-08-03 NOTE — Progress Notes (Signed)
Pt had CBC per heparin protocol ordered daily.  Pt stuck 6 times this am with no success of obtaining blood.  CBC d/c'd per heparin protocol being d/c'd.  Will continue to monitor.

## 2015-08-03 NOTE — Progress Notes (Signed)
[facility] PROGRESS NOTE  Loni Kolin [id number] DOB: 1932/06/08 DOA: 08/01/2015 PCP: No PCP Per Patient  Interim summary and HPI 82 y.o. female with medical history significant of hypertension, diabetes, hyperlipidemia, C daily, COPD, left thalamic infarct in June 2017; who presented with epigastric/chest pain. No vomiting, no SOB, no fever, chills or cough. Patient was found to have elevated troponin and admitted for ACS r/o.  Assessment/Plan: 1-chest pain/elevated troponin: with concerns for NSTEMI -troponin mildly elevated and some T waves changes on EKG -initially started on heparin drip, but due to poor IV access and patient preference -discussed with cardiology who recommended medical therapy (plavix, statins and b-blocker) -patient is no complaining of CP any more and endorses improving even in her intermittent epigastric discomfort  -will continue PPI  2-recent stroke -no new focal deficit  -will continue ASA for secondary prevention  -continue risk factors modifications  3-diabetes type 2 with renal failure (stage 3): -will continue SSI and lantus -will also continue carb modified diet  4-CKD stage 3 at baseline: -stable currently; will intermittently follow renal function trend  5-COPD: -stable -no wheezing or complaining of SOB -will continue home meds  6-GERD:  -will continue PPI  7-HTN: -stable -will continue current antihypertensive regimen   Code Status: DNR Family Communication: no family at bedside  Disposition Plan: home in 24-48 hours; will follow rec's on cardiology, continue PRN analgesics.   Consultants:  Cardiology   Procedures:  See below for x-ray reports   Antibiotics:  None   HPI/Subjective: Feeling better, no nausea, no vomiting. Still with some intermittent vague abd discomfort. No SOB or palpitations. Patient is very hard of hearing.  Objective: Filed Vitals:   08/03/15 0737 08/03/15 1128  BP: 124/57 126/71   Pulse: 58 53  Temp: 98 F (36.7 C) 98.3 F (36.8 C)  Resp: 18 18    Intake/Output Summary (Last 24 hours) at 08/03/15 1514 Last data filed at 08/03/15 1306  Gross per 24 hour  Intake    840 ml  Output    350 ml  Net    490 ml   Filed Weights   08/01/15 2044 08/02/15 0216 08/03/15 0400  Weight: 85.73 kg (189 lb) 74.798 kg (164 lb 14.4 oz) 75.116 kg (165 lb 9.6 oz)    Exam:   General:  Afebrile, reports some epigastric pain and burping; no palpitations, no nausea, no vomiting. Patient will like to have diet advance.  Cardiovascular: S1 and S2, no rubs or gallops  Respiratory: CTA bilaterally  Abdomen: soft, ND, positive BS and complaining of vague intermittent mid epigastric pain and lower quadrants bilaterally (pain free and asking for food during my examination)  Musculoskeletal: no edema or cyanosis   Data Reviewed: Basic Metabolic Panel:  Recent Labs Lab 08/01/15 2051 08/02/15 0225  NA 136  --   K 2.8*  --   CL 102  --   CO2 25  --   GLUCOSE 205*  --   BUN 15  --   CREATININE 1.36*  --   CALCIUM 9.8  --   MG  --  1.8  PHOS  --  2.7   Liver Function Tests:  Recent Labs Lab 08/01/15 2051  AST 22  ALT 24  ALKPHOS 84  BILITOT 0.5  PROT 7.4  ALBUMIN 3.9    Recent Labs Lab 08/01/15 2051 08/02/15 0225  LIPASE 80* 56*   CBC:  Recent Labs Lab 08/01/15 2051  WBC 10.2  HGB 14.7  HCT 44.3  MCV 79.7  PLT 221   Cardiac Enzymes:  Recent Labs Lab 08/01/15 2051 08/02/15 0225 08/02/15 0624  TROPONINI 0.13* 0.16* 0.11*   CBG:  Recent Labs Lab 08/02/15 2058 08/03/15 0008 08/03/15 0413 08/03/15 0737 08/03/15 1126  GLUCAP 207* 200* 196* 151* 196*    Recent Results (from the past 240 hour(s))  MRSA PCR Screening     Status: None   Collection Time: 08/02/15  1:58 AM  Result Value Ref Range Status   MRSA by PCR NEGATIVE NEGATIVE Final    Comment:        The GeneXpert MRSA Assay (FDA approved for NASAL specimens only), is one  component of a comprehensive MRSA colonization surveillance program. It is not intended to diagnose MRSA infection nor to guide or monitor treatment for MRSA infections.      Studies: Ct Abdomen Pelvis Wo Contrast  08/02/2015  CLINICAL DATA:  Abdominal pain. History of hypertension and diabetes. EXAM: CT ABDOMEN AND PELVIS WITHOUT CONTRAST TECHNIQUE: Multidetector CT imaging of the abdomen and pelvis was performed following the standard protocol without IV contrast. COMPARISON:  [id number] FINDINGS: Italic. Unenhanced appearance of the liver, gallbladder, pancreas, inferior vena cava, and retroperitoneal lymph nodes is unremarkable. Sub cm exophytic lesion off of the spleen is new since previous study. Characterization is not possible due to small size. Interval development of a 3 cm diameter right adrenal gland nodule with poorly defined margins. Density measurements are intact. Exophytic lesions off of the right kidney, largest measuring 1.6 cm diameter. Density measures suggests a cyst. No hydronephrosis in either kidney. Calcification of the abdominal aorta without aneurysm. Stomach, small bowel, and colon are not abnormally distended. Stool fills the colon. No free air or free fluid in the abdomen. Pelvis: Diverticulosis of the sigmoid colon. No evidence of diverticulitis. Appendix is normal. Bladder wall is not thickened. No free or loculated pelvic fluid collections. Surgical absence of the uterus. Left ovary is her aunt at 3.7 x 6 cm, enlarging since previous study. She sound for further by IMPRESSION: Developing 3 cm nodule in right adrenal gland. Hounsfield unit measurements are indeterminate and margins are somewhat poorly defined. Consider biochemical evaluation or MRI for further evaluation. New lesions in the right kidney and spleen, likely representing cysts. Enlarged left ovary. Suggest ultrasound follow-up. Electronically Signed   By: Lucienne Capers M.D.   On: 08/02/2015 05:37   Dg  Chest 2 View  08/01/2015  CLINICAL DATA:  Heartburn for 1 week. Burning in chest and under left breast. EXAM: CHEST  2 VIEW COMPARISON:  Two-view chest x-ray 07/11/2015. FINDINGS: Heart size is normal. Lung volumes are low. No focal airspace disease present. There is no edema or effusion to suggest failure. The visualized soft tissues and bony thorax are unremarkable. IMPRESSION: 1. Low lung volumes. 2. No acute cardiopulmonary disease. Electronically Signed   By: San Morelle M.D.   On: 08/01/2015 21:16    Scheduled Meds: . aspirin  81 mg Oral Daily  . atorvastatin  40 mg Oral q1800  . carvedilol  12.5 mg Oral BID WC  . clopidogrel  75 mg Oral Daily  . feeding supplement (ENSURE ENLIVE)  237 mL Oral BID BM  . gabapentin  300 mg Oral TID  . insulin aspart  0-9 Units Subcutaneous Q4H  . insulin glargine  30 Units Subcutaneous BID  . pantoprazole  40 mg Oral BID   Continuous Infusions:   Active Problems:   Diabetes [facility])   Essential hypertension  Type 2 diabetes mellitus with hyperglycemia, with long-term current use of insulin (H[facility])   CKD (chronic kidney disease), stage III   Chronic obstructive pulmonary disease ([facility])   Chest pain   Abdominal pain, epigastric   Abdominal aortic atherosclerosis ([facility])   Elevated troponin    Time spent: 30 minutes    [name]  [facility] Pager [phone]. If 7PM-7AM, please contact night-coverage at [email or web address], password [email or web address] 08/03/2015, 3:14 PM  LOS: 1 day

## 2015-08-04 ENCOUNTER — Encounter (INDEPENDENT_AMBULATORY_CARE_PROVIDER_SITE_OTHER): Payer: Self-pay | Admitting: Ophthalmology

## 2015-08-04 DIAGNOSIS — E1122 Type 2 diabetes mellitus with diabetic chronic kidney disease: Secondary | ICD-10-CM | POA: Diagnosis not present

## 2015-08-04 DIAGNOSIS — I214 Non-ST elevation (NSTEMI) myocardial infarction: Secondary | ICD-10-CM | POA: Diagnosis not present

## 2015-08-04 DIAGNOSIS — N183 Chronic kidney disease, stage 3 (moderate): Secondary | ICD-10-CM | POA: Diagnosis not present

## 2015-08-04 DIAGNOSIS — I10 Essential (primary) hypertension: Secondary | ICD-10-CM | POA: Diagnosis not present

## 2015-08-04 DIAGNOSIS — R079 Chest pain, unspecified: Secondary | ICD-10-CM | POA: Diagnosis not present

## 2015-08-04 DIAGNOSIS — J449 Chronic obstructive pulmonary disease, unspecified: Secondary | ICD-10-CM | POA: Diagnosis not present

## 2015-08-04 LAB — CBC
HEMATOCRIT: 44.5 % (ref 36.0–46.0)
HEMOGLOBIN: 14.4 g/dL (ref 12.0–15.0)
MCH: 26.2 pg (ref 26.0–34.0)
MCHC: 32.4 g/dL (ref 30.0–36.0)
MCV: 80.9 fL (ref 78.0–100.0)
Platelets: 201 10*3/uL (ref 150–400)
RBC: 5.5 MIL/uL — AB (ref 3.87–5.11)
RDW: 15.6 % — ABNORMAL HIGH (ref 11.5–15.5)
WBC: 9.7 10*3/uL (ref 4.0–10.5)

## 2015-08-04 LAB — GLUCOSE, CAPILLARY
GLUCOSE-CAPILLARY: 204 mg/dL — AB (ref 65–99)
GLUCOSE-CAPILLARY: 125 mg/dL — AB (ref 65–99)
Glucose-Capillary: 153 mg/dL — ABNORMAL HIGH (ref 65–99)
Glucose-Capillary: 115 mg/dL — ABNORMAL HIGH (ref 65–99)

## 2015-08-04 LAB — BASIC METABOLIC PANEL
ANION GAP: 9 (ref 5–15)
BUN: 16 mg/dL (ref 6–20)
CALCIUM: 9.9 mg/dL (ref 8.9–10.3)
CO2: 24 mmol/L (ref 22–32)
Chloride: 105 mmol/L (ref 101–111)
Creatinine, Ser: 1.41 mg/dL — ABNORMAL HIGH (ref 0.44–1.00)
GFR, EST AFRICAN AMERICAN: 40 mL/min — AB (ref >60)
GFR, EST NON AFRICAN AMERICAN: 34 mL/min — AB (ref >60)
Glucose, Bld: 112 mg/dL — ABNORMAL HIGH (ref 65–99)
Potassium: 3.9 mmol/L (ref 3.5–5.1)
SODIUM: 138 mmol/L (ref 135–145)

## 2015-08-04 LAB — HEMOGLOBIN A1C
HEMOGLOBIN A1C: 8.4 % — AB (ref 4.8–5.6)
MEAN PLASMA GLUCOSE: 194 mg/dL

## 2015-08-04 MED ORDER — GI COCKTAIL ~~LOC~~
30.0000 mL | Freq: Three times a day (TID) | ORAL | Status: DC | PRN
  Administered 2015-08-04 – 2015-08-05 (×2): 30 mL via ORAL
  Filled 2015-08-04 (×2): qty 30

## 2015-08-04 MED ORDER — FAMOTIDINE 20 MG PO TABS
20.0000 mg | ORAL_TABLET | Freq: Every day | ORAL | Status: DC
  Administered 2015-08-04: 20 mg via ORAL
  Filled 2015-08-04: qty 1

## 2015-08-04 NOTE — Progress Notes (Signed)
Nutrition Brief Note  Patient identified on the Malnutrition Screening Tool (MST) Report. Patient reports recent weight loss from 190's to 160's since her stroke a few weeks ago. Question accuracy of documented weights from June and current admission, some may be reported weights, instead of an actual weight.   Wt Readings from Last 15 Encounters:  08/04/15 165 lb (74.844 kg)  07/12/15 189 lb 9.5 oz (86 kg)  07/11/15 191 lb (86.637 kg)  01/20/11 191 lb 9.6 oz (86.909 kg)    Body mass index is 28.31 kg/(m^2). Patient meets criteria for overweight based on current BMI.   Nutrition focused physical exam completed.  No muscle or subcutaneous fat depletion noticed.  Current diet order is CHO modified, patient is consuming approximately 100% of meals at this time. Labs and medications reviewed. She reports poor intake at home due to abdominal pain, but intake has now improved. She is eating very well per discussion with RN.   No nutrition interventions warranted at this time. If nutrition issues arise, please consult RD.   Molli Barrows, RD, LDN, [place] Pager [phone] After Hours Pager [phone]

## 2015-08-04 NOTE — Evaluation (Signed)
Physical Therapy Evaluation Patient Details Name: Paula West MRN: [id number] DOB: Jul 25, 1932 Today's Date: 08/04/2015   History of Present Illness  82 y.o. female admitted to [facility] on 08/01/15 for chest pain, elevated triponin, and concern for NSTEMI.  MD recommending medical management.  Pt with significant PMHx of COPD, HTN, DM, CKD, CVA with residual left sided weakness.    Clinical Impression  Pt is very unsafe with the use of her RW and needs max verbal cues to be safe with gait. Gait was limited by decreased endurance and reports of "acid reflux pain"  RN made aware.  PT would benefit from HHPT to reinforce safe RW use and for strengthening and balance training.   PT to follow acutely for deficits listed below.     Follow Up Recommendations Home health PT;Supervision for mobility/OOB    Equipment Recommendations  None recommended by PT    Recommendations for Other Services   NA    Precautions / Restrictions Precautions Precautions: Fall Precaution Comments: HOH      Mobility  Bed Mobility               General bed mobility comments: Pt OOB in recliner chair.  Transfers Overall transfer level: Needs assistance Equipment used: Rolling walker (2 wheeled) Transfers: Sit to/from Stand Sit to Stand: Min assist         General transfer comment: Min assist to power up to standing .  Verbal cues for safe hand placement and safe RW use.   Ambulation/Gait Ambulation/Gait assistance: Min assist Ambulation Distance (Feet): 20 Feet Assistive device: Rolling walker (2 wheeled) Gait Pattern/deviations: Step-through pattern;Trunk flexed     General Gait Details: Pt needed max verbal cues for safe RW use, closer proximity, upright posture, stay inside of RW at all times and take RW all the way with her to her sitting surface.  Pt very unsafe with her use of the assistive device and reports, "yeah, my daughter is on me all the time about that"         Balance Overall  balance assessment: Needs assistance Sitting-balance support: Feet supported;Bilateral upper extremity supported Sitting balance-Leahy Scale: Good     Standing balance support: Bilateral upper extremity supported Standing balance-Leahy Scale: Poor                               Pertinent Vitals/Pain Pain Assessment: 0-10 Pain Score: 10-Worst pain ever Pain Location: acid reflux pain.  Pain Descriptors / Indicators: Burning Pain Intervention(s): Limited activity within patient's tolerance;Monitored during session;Repositioned;Patient requesting pain meds-RN notified    Home Living Family/patient expects to be discharged to:: Private residence Living Arrangements: Children;Other relatives (daughter and grandson) Available Help at Discharge: Family;Personal care attendant;Available 24 hours/day (aide 3 hours a day 5 days per week) Type of Home: House       Home Layout: One level Home Equipment: [place] - 2 wheels;Cane - single point;Bedside commode;Shower seat;Transport chair      Prior Function Level of Independence: Needs assistance   Gait / Transfers Assistance Needed: RW for in house mobility, transport chair for community mobility  ADL's / Homemaking Assistance Needed: Assist for bathing, dressing, getting in/out of bed and shower. Total assist for IADLs        Hand Dominance   Dominant Hand: Right    Extremity/Trunk Assessment   Upper Extremity Assessment: Defer to OT evaluation           Lower  Extremity Assessment: LLE deficits/detail   LLE Deficits / Details: left leg is weaker than right which is consistant with PMHx of stroke.  Pt doesnt' seem to drag her foot at all, but does have weakness with gross MMT seated hip flexion, ankle PF/DF, and kness extension 4-/5  Cervical / Trunk Assessment: Kyphotic  Communication   Communication: HOH  Cognition Arousal/Alertness: Awake/alert Behavior During Therapy: Impulsive Overall Cognitive Status:  Impaired/Different from baseline Area of Impairment: Memory;Attention;Following commands;Safety/judgement;Problem solving;Awareness;Orientation Orientation Level: Disoriented to;Time;Situation ("June", "I am here for a stroke") Current Attention Level: Sustained Memory: Decreased short-term memory Following Commands: Follows one step commands with increased time Safety/Judgement: Decreased awareness of safety;Decreased awareness of deficits Awareness: Intellectual Problem Solving: Slow processing;Requires verbal cues General Comments: Pt is a bit impulsive, at times ignoring safety cues, "Let me do it my way"       Exercises General Exercises - Lower Extremity Ankle Circles/Pumps: AROM;Both;10 reps Quad Sets: AROM;Both;10 reps Heel Slides: AROM;Both;10 reps Other Exercises Other Exercises: HEP handout given of above listed exercises.      Assessment/Plan    PT Assessment Patient needs continued PT services  PT Diagnosis Difficulty walking;Abnormality of gait;Generalized weakness;Acute pain   PT Problem List Decreased strength;Decreased activity tolerance;Decreased balance;Decreased mobility;Decreased knowledge of use of DME;Decreased safety awareness;Obesity;Pain  PT Treatment Interventions DME instruction;Gait training;Stair training;Functional mobility training;Therapeutic activities;Balance training;Therapeutic exercise;Neuromuscular re-education;Cognitive remediation;Patient/family education   PT Goals (Current goals can be found in the Care Plan section) Acute Rehab PT Goals Patient Stated Goal: to decrease pain, get back home PT Goal Formulation: With patient Time For Goal Achievement: 08/18/15 Potential to Achieve Goals: Good    Frequency Min 3X/week        End of Session Equipment Utilized During Treatment: Gait belt Activity Tolerance: Patient limited by pain;Patient limited by fatigue Patient left: in chair;with call bell/phone within reach      Functional  Assessment Tool Used: assist level Functional Limitation: Mobility: Walking and moving around Mobility: Walking and Moving Around Current Status [phone]): At least 20 percent but less than 40 percent impaired, limited or restricted Mobility: Walking and Moving Around Goal Status [phone]): At least 1 percent but less than 20 percent impaired, limited or restricted    Time: [id number] PT Time Calculation (min) (ACUTE ONLY): 33 min   Charges:   PT Evaluation $PT Eval Moderate Complexity: 1 Procedure PT Treatments $Therapeutic Activity: 8-22 mins   PT G Codes:   PT G-Codes **NOT FOR INPATIENT CLASS** Functional Assessment Tool Used: assist level Functional Limitation: Mobility: Walking and moving around Mobility: Walking and Moving Around Current Status [id number]): At least 20 percent but less than 40 percent impaired, limited or restricted Mobility: Walking and Moving Around Goal Status [phone]): At least 1 percent but less than 20 percent impaired, limited or restricted    Anaalicia B. [place], [place], DPT [phone]   08/04/2015, 11:49 AM

## 2015-08-04 NOTE — Progress Notes (Signed)
Occupational Therapy Evaluation Patient Details Name: Paula West MRN: [id number] DOB: 1932/03/08 Today's Date: 08/04/2015    History of Present Illness 82 y.o. female admitted to [facility] on 08/01/15 for chest pain, elevated triponin, and concern for NSTEMI.  MD recommending medical management.  Pt with significant PMHx of COPD, HTN, DM, CKD, CVA with residual left sided weakness.     Clinical Impression   PTA, pt required assistance for all ADLs and IADLs from PCA or daughter, unsure how pt was mobilizing however she reports using RW. Pt currently requires max assist for LB ADLs and mod assist for basic transfers due to generalized weakness, balance deficits, impulsivity, and cognitive deficits. Unsure where pt/family plan for d/c, but currently recommending SNF since no family present to determine if they are capable of providing this level of physical assistance. Will continue to follow acutely.     Follow Up Recommendations  SNF;Supervision/Assistance - 24 hour (No family present to confirm if they are able to provide)    Equipment Recommendations  Other (comment) (TBD at next venue)    Recommendations for Other Services       Precautions / Restrictions Precautions Precautions: Fall Precaution Comments: HOH Restrictions Weight Bearing Restrictions: No      Mobility Bed Mobility Overal bed mobility: Needs Assistance Bed Mobility: Sit to Sidelying         Sit to sidelying: Min assist General bed mobility comments: assist to move BLE onto bed. HOB flat, heavy use of bedrails. Pt able to reposition self in bed with min guard assist for safety.  Transfers Overall transfer level: Needs assistance Equipment used: Rolling walker (2 wheeled) Transfers: Sit to/from Stand Sit to Stand: Mod assist         General transfer comment: x3 with mod assist for boost to stand and to stabilize balance. 2nd attempt, pt stood and immediately fell backwards into chair while still  holding onto RW. Pt requires max VC for safe hand placement, proper RW use, anterior translation of hips in standing, and to pause once standing before walking away from surface.    Balance Overall balance assessment: Needs assistance Sitting-balance support: No upper extremity supported;Feet supported Sitting balance-Leahy Scale: Fair     Standing balance support: Bilateral upper extremity supported;During functional activity Standing balance-Leahy Scale: Poor Standing balance comment: heavy reliance on BUE support, pt often rest bil forearms on RW or other surface and holds flexed posture to complete tasks.                            ADL Overall ADL's : Needs assistance/impaired Eating/Feeding: Minimal assistance;Sitting Eating/Feeding Details (indicate cue type and reason): to open packages and containers Grooming: Wash/dry hands;Wash/dry face;Moderate assistance;Sitting;Standing Grooming Details (indicate cue type and reason): assist for balance initially. Pt attempting to wash face in flexed position with bil forearms resting on RW. Encouraged pt to sit to complete tasks                 Toilet Transfer: Moderate assistance;Cueing for safety;Ambulation;BSC;RW Toilet Transfer Details (indicate cue type and reason): max cues for safety Toileting- Clothing Manipulation and Hygiene: Maximal assistance;Sit to/from stand;Cueing for safety       Functional mobility during ADLs: Moderate assistance;Rolling walker General ADL Comments: Requires max cues for safety due to impulsivity, decreased safety awareness, and HOH.      Vision     Perception     Praxis      Pertinent  Vitals/Pain Pain Assessment: Faces Pain Score: 10-Worst pain ever Faces Pain Scale: Hurts even more Pain Location: acid reflux pain Pain Descriptors / Indicators: Burning Pain Intervention(s): Limited activity within patient's tolerance;Monitored during session;Repositioned     Hand  Dominance Right   Extremity/Trunk Assessment Upper Extremity Assessment Upper Extremity Assessment: Generalized weakness (R>L) RUE Coordination: decreased gross motor;decreased fine motor LUE Coordination: decreased fine motor;decreased gross motor   Lower Extremity Assessment Lower Extremity Assessment: Defer to PT evaluation LLE Deficits / Details: left leg is weaker than right which is consistant with PMHx of stroke.  Pt doesnt' seem to drag her foot at all, but does have weakness with gross MMT seated hip flexion, ankle PF/DF, and kness extension 4-/5 LLE Coordination: decreased fine motor   Cervical / Trunk Assessment Cervical / Trunk Assessment: Kyphotic   Communication Communication Communication: HOH   Cognition Arousal/Alertness: Awake/alert Behavior During Therapy: Impulsive Overall Cognitive Status: Impaired/Different from baseline Area of Impairment: Orientation;Attention;Memory;Awareness;Problem solving;Safety/judgement;Following commands Orientation Level: Disoriented to;Situation ("I'm here cause I had a stroke") Current Attention Level: Sustained Memory: Decreased short-term memory Following Commands: Follows one step commands with increased time Safety/Judgement: Decreased awareness of safety;Decreased awareness of deficits Awareness: Intellectual Problem Solving: Slow processing;Requires verbal cues General Comments: Pt is impulsive and does not follow commands consistently.   General Comments       Exercises Exercises: General Lower Extremity;Other exercises Other Exercises Other Exercises: HEP handout given of above listed exercises.   Shoulder Instructions      Home Living Family/patient expects to be discharged to:: Private residence Living Arrangements: Children;Other relatives (daughter and grandson) Available Help at Discharge: Family;Personal care attendant;Available 24 hours/day (aide comes 3hrs/5 days per week) Type of Home: House       Home  Layout: One level     Bathroom Shower/Tub: Tub/shower unit Shower/tub characteristics: Curtain Biochemist, clinical: Standard     Home Equipment: Environmental consultant - 2 wheels;Cane - single point;Bedside commode;Shower seat;Transport chair          Prior Functioning/Environment Level of Independence: Needs assistance  Gait / Transfers Assistance Needed: RW for in house mobility, transport chair for community mobility ADL's / Homemaking Assistance Needed: Assist for bathing, dressing, getting in/out of bed and shower. Total assist for IADLs Communication / Swallowing Assistance Needed: HOH      OT Diagnosis: Generalized weakness;Cognitive deficits;Acute pain;Paresis   OT Problem List: Decreased strength;Decreased range of motion;Decreased activity tolerance;Impaired balance (sitting and/or standing);Decreased coordination;Decreased cognition;Decreased safety awareness;Decreased knowledge of use of DME or AE;Obesity;Pain   OT Treatment/Interventions: Self-care/ADL training;Therapeutic exercise;DME and/or AE instruction;Therapeutic activities;Patient/family education;Balance training;Cognitive remediation/compensation    OT Goals(Current goals can be found in the care plan section) Acute Rehab OT Goals Patient Stated Goal: none stated OT Goal Formulation: Patient unable to participate in goal setting Time For Goal Achievement: 08/18/15 Potential to Achieve Goals: Good ADL Goals Pt Will Perform Grooming: with set-up;sitting Pt Will Perform Upper Body Bathing: with min guard assist;sitting Pt Will Perform Lower Body Bathing: with min assist;sit to/from stand Pt Will Transfer to Toilet: with min guard assist;ambulating;bedside commode (over toilet) Pt Will Perform Toileting - Clothing Manipulation and hygiene: sit to/from stand;with min assist  OT Frequency: Min 2X/week   Barriers to D/C:            Co-evaluation              End of Session Equipment Utilized During Treatment: Gait  belt;Rolling walker Nurse Communication: Mobility status  Activity Tolerance: Patient limited by pain Patient  left: in bed;with call bell/phone within reach;with bed alarm set   Time: [phone] OT Time Calculation (min): 26 min Charges:  OT General Charges $OT Visit: 1 Procedure OT Evaluation $OT Eval Moderate Complexity: 1 Procedure OT Treatments $Self Care/Home Management : 8-22 mins G-Codes: OT G-codes **NOT FOR INPATIENT CLASS** Functional Assessment Tool Used: clinical judgement Functional Limitation: Self care Self Care Current Status [id number]): At least 40 percent but less than 60 percent impaired, limited or restricted Self Care Goal Status [id number]): At least 20 percent but less than 40 percent impaired, limited or restricted  Redmond Baseman, OTR/L Pager: [phone] 08/04/2015, 1:43 PM

## 2015-08-04 NOTE — Care Management Obs Status (Signed)
[facility] NOTIFICATION   Patient Details  Name: Paula West MRN: [id number] Date of Birth: 09-Aug-1932   Medicare Observation Status Notification Given:  Yes    Bethena Roys, RN 08/04/2015, 12:01 PM

## 2015-08-04 NOTE — Progress Notes (Signed)
[facility] PROGRESS NOTE  Paula West [id number] DOB: March 19, 1932 DOA: 08/01/2015 PCP: No PCP Per Patient  Interim summary and HPI 82 y.o. female with medical history significant of hypertension, diabetes, hyperlipidemia, C daily, COPD, left thalamic infarct in June 2017; who presented with epigastric/chest pain. No vomiting, no SOB, no fever, chills or cough. Patient was found to have elevated troponin and admitted for ACS r/o.  Assessment/Plan: 1-chest pain/elevated troponin: with concerns for NSTEMI -troponin mildly elevated and some T waves changes on EKG -initially started on heparin drip, but due to poor IV access and patient preference -discussed with cardiology who recommended medical therapy (plavix, statins and b-blocker) -patient is no complaining of CP any more and endorses improving even in her intermittent epigastric discomfort  -will continue PPI, now adjusted to BID and will add pepcid QHS  2-recent stroke -no new focal deficit  -will continue ASA for secondary prevention  -continue risk factors modifications  3-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   Consultants:  Cardiology   Procedures:  See below for x-ray reports   Antibiotics:  None   HPI/Subjective: Feeling better overall. Still with complaints of reflux/epigastric pain. No nausea, no vomiting. No SOB or palpitations.  Objective: Filed Vitals:   08/04/15 1136 08/04/15 1144  BP:    Pulse: 67   Temp:  97.7 F (36.5 C)  Resp:      Intake/Output Summary (Last 24 hours) at 08/04/15 1610 Last data filed at 08/04/15 0842  Gross per 24 hour  Intake    660 ml  Output    950 ml  Net   -290 ml   Filed Weights   08/02/15 0216 08/03/15 0400 08/04/15 0400  Weight: 74.798 kg (164 lb 14.4 oz) 75.116 kg (165 lb 9.6 oz) 74.844 kg (165 lb)    Exam:   General:  Afebrile, reports some epigastric pain/reflux symptoms; no palpitations, no nausea, no vomiting. Patient tolerating diet.  Cardiovascular: S1 and S2, no rubs or gallops  Respiratory: CTA bilaterally  Abdomen: soft, ND, positive BS and complaining of vague intermittent mid epigastric pain and lower quadrants bilaterally (pain free and asking for food during my examination)  Musculoskeletal: no edema or cyanosis   Data Reviewed: Basic Metabolic Panel:  Recent Labs Lab 08/01/15 2051 08/02/15 0225  NA 136  --   K 2.8*  --   CL 102  --   CO2 25  --   GLUCOSE 205*  --   BUN 15  --   CREATININE 1.36*  --   CALCIUM 9.8  --   MG  --  1.8  PHOS  --  2.7   Liver Function Tests:  Recent Labs Lab 08/01/15 2051  AST 22  ALT 24  ALKPHOS 84  BILITOT 0.5  PROT 7.4  ALBUMIN 3.9    Recent Labs Lab 08/01/15 2051 08/02/15 0225  LIPASE 80* 56*   CBC:  Recent Labs Lab 08/01/15 2051  WBC 10.2  HGB 14.7  HCT 44.3  MCV  79.7  PLT 221   Cardiac Enzymes:  Recent Labs Lab 08/01/15 2051 08/02/15 0225 08/02/15 0624  TROPONINI 0.13* 0.16* 0.11*   CBG:  Recent Labs Lab 08/03/15 1126 08/03/15 1646 08/03/15 2007 08/04/15 0726 08/04/15 1137  GLUCAP 196* 128* 151* 153* 204*    Recent Results (from the past 240 hour(s))  MRSA PCR Screening     Status: None   Collection Time: 08/02/15  1:58 AM  Result Value Ref Range Status   MRSA by PCR NEGATIVE NEGATIVE Final    Comment:        The GeneXpert MRSA Assay (FDA approved for NASAL specimens only), is one  component of a comprehensive MRSA colonization surveillance program. It is not intended to diagnose MRSA infection nor to guide or monitor treatment for MRSA infections.      Studies: No results found.  Scheduled Meds: . aspirin  81 mg Oral Daily  . atorvastatin  40 mg Oral q1800  . carvedilol  12.5 mg Oral BID WC  . clopidogrel  75 mg Oral Daily  . famotidine  20 mg Oral QHS  . gabapentin  300 mg Oral TID  . insulin aspart  0-5 Units Subcutaneous QHS  . insulin aspart  0-9 Units Subcutaneous TID WC  . insulin glargine  30 Units Subcutaneous BID  . pantoprazole  40 mg Oral BID   Continuous Infusions:   Active Problems:   Diabetes ([place])   Essential hypertension   Type 2 diabetes mellitus with hyperglycemia, with long-term current use of insulin (HCC)   CKD (chronic kidney disease), stage III   Chronic obstructive pulmonary disease (HCC)   Chest pain   Abdominal pain, epigastric   Abdominal aortic atherosclerosis (HCC)   Elevated troponin    Time spent: 30 minutes    [name]  [facility] Pager [phone]. If 7PM-7AM, please contact night-coverage at [email or web address], password [facility] 08/04/2015, 4:10 PM  LOS: 2 days

## 2015-08-05 DIAGNOSIS — I214 Non-ST elevation (NSTEMI) myocardial infarction: Secondary | ICD-10-CM | POA: Diagnosis not present

## 2015-08-05 DIAGNOSIS — R1013 Epigastric pain: Secondary | ICD-10-CM | POA: Diagnosis not present

## 2015-08-05 DIAGNOSIS — R079 Chest pain, unspecified: Secondary | ICD-10-CM | POA: Diagnosis not present

## 2015-08-05 DIAGNOSIS — N183 Chronic kidney disease, stage 3 (moderate): Secondary | ICD-10-CM | POA: Diagnosis not present

## 2015-08-05 DIAGNOSIS — K219 Gastro-esophageal reflux disease without esophagitis: Secondary | ICD-10-CM | POA: Insufficient documentation

## 2015-08-05 DIAGNOSIS — R7989 Other specified abnormal findings of blood chemistry: Secondary | ICD-10-CM | POA: Diagnosis not present

## 2015-08-05 LAB — GLUCOSE, CAPILLARY
GLUCOSE-CAPILLARY: 197 mg/dL — AB (ref 65–99)
GLUCOSE-CAPILLARY: 178 mg/dL — AB (ref 65–99)
Glucose-Capillary: 85 mg/dL (ref 65–99)

## 2015-08-05 MED ORDER — SUCRALFATE 1 GM/10ML PO SUSP: 1.0000 g | Freq: Three times a day (TID) | ORAL | Status: DC

## 2015-08-05 MED ORDER — RANITIDINE HCL 300 MG PO TABS: 300.0000 mg | ORAL_TABLET | Freq: Every day | ORAL | Status: DC

## 2015-08-05 MED ORDER — CARVEDILOL 12.5 MG PO TABS: 12.5000 mg | ORAL_TABLET | Freq: Two times a day (BID) | ORAL | Status: DC

## 2015-08-05 MED ORDER — OMEPRAZOLE 20 MG PO CPDR: 40.0000 mg | DELAYED_RELEASE_CAPSULE | Freq: Two times a day (BID) | ORAL | Status: DC

## 2015-08-05 MED ORDER — CLOPIDOGREL BISULFATE 75 MG PO TABS
75.0000 mg | ORAL_TABLET | Freq: Every day | ORAL | Status: DC
Start: 2015-08-05 — End: 2015-09-15

## 2015-08-05 NOTE — Care Management Note (Signed)
Case Management Note  Patient Details  Name: Paula West MRN: [id number] Date of Birth: 08/02/1932  Subjective/Objective:   Pt presented for COPD. Pt is from home with support of daughter [name] and [place] from [facility].                  Action/Plan: Plan will be for pt to d/c home today with HHRN/PT Services. CM did reach out to daughter [name] for agency choice. [name]. Referral was made and SOC to begin within 24-48 hours post d/c. Daughter and Aid to pick up pt once d/c posted. No further needs from CM at this time.    Expected Discharge Date:                  Expected Discharge Plan:  [place]  In-House Referral:  NA  Discharge planning Services  CM Consult  Post Acute Care Choice:  Home Health Choice offered to:  Patient  DME Arranged:  N/A DME Agency:  NA  HH Arranged:  RN, PT [place] Agency:  [place]  Status of Service:  Completed, signed off  If discussed at [place] of Stay Meetings, dates discussed:    Additional Comments:  Bethena Roys, RN 08/05/2015, 11:41 AM

## 2015-08-05 NOTE — Progress Notes (Signed)
Attempted calling pts daughter, sister and son about discharge. No answer. Left message. Informed from pt daughter is having a procedure (she cant remember what) and will be back to the hospital after. Pt lives at home with family, no way to enter house. Will wait for call back or family to arrive.

## 2015-08-05 NOTE — Discharge Summary (Signed)
Physician Discharge Summary  Paula West [id number] DOB: 24-Mar-1932 DOA: 08/01/2015  PCP: No PCP Per Patient  Admit date: 08/01/2015 Discharge date: 08/05/2015  Time spent: 35 minutes  Recommendations for Outpatient Follow-up:  Repeat BMET to follow electrolytes and renal function Reassess BP and adjust antihypertensive regimen as needed   Discharge Diagnoses:  Active Problems:   Diabetes (Paula [facility])   Essential hypertension   Type 2 diabetes mellitus with hyperglycemia, with long-term current use of insulin (HCC)   CKD (chronic kidney disease), stage III   Chronic obstructive pulmonary disease (HCC)   Chest pain   Abdominal pain, epigastric   Abdominal aortic atherosclerosis (HCC)   Elevated troponin   Discharge Condition: stable and improved. Will discharge home with family care and [facility] services. Outpatient follow up with PCP and with cardiology service on as needed basis.  Diet recommendation: heart healthy and modified carbohydrates   Filed Weights   08/03/15 0400 08/04/15 0400 08/05/15 0349  Weight: 75.116 kg (165 lb 9.6 oz) 74.844 kg (165 lb) 75.161 kg (165 lb 11.2 oz)    History of present illness:  82 y.o. female with medical history significant of hypertension, diabetes, hyperlipidemia, C daily, COPD, left thalamic infarct in June 2017; who presented with epigastric/chest pain. No vomiting, no SOB, no fever, chills or cough. Patient was found to have elevated troponin and admitted for ACS r/o.  Hospital Course:  1-chest pain/elevated troponin: with concerns for NSTEMI -troponin mildly elevated and some T waves changes on EKG on admission  -initially started on heparin drip, but due to poor IV access and patient preferences medication discontinued  -discussed with cardiology who recommended medical therapy (plavix, statins, ASA and b-blocker) -patient is no complaining of CP any more and endorses improving even in her intermittent epigastric discomfort  -will  continue PPI, now adjusted to BID and will add Zantac QHS  2-recent ischemic stroke -no new focal deficit  -will continue ASA for secondary prevention  -continue risk factors modifications -home health services for PT/RN arranged   3-diabetes type 2 with renal failure (stage 3): -will resume home oral hypoglycemic regimen  -will also continue carb modified diet  4-CKD stage 3 at baseline and hypokalemia: -stable and at baseline -advise to maintain adequate hydration -will need BMET at follow up to assess electrolytes and renal function -potassium repleted and WNL at discharge  5-COPD: -stable -no wheezing or complaining of SOB -will continue home meds  6-GERD:  -will continue PPI and zantac -also carafate for better symptoms control (patient reprots improvement with its use while in the hopsital)  7-HTN: -stable -will discharge on Benicar and coreg  Procedures:  See below for x-ray reports   Consultations:  Cardiology   Discharge Exam: Filed Vitals:   08/05/15 0842 08/05/15 1123  BP: 116/60 121/93  Pulse: 73 54  Temp: 98.1 F (36.7 C) 98 F (36.7 C)  Resp:      General: Afebrile, reports some intermittent epigastric pain/reflux symptoms (but even present, much improved); no frank CP, no palpitations, no nausea, no vomiting. Patient tolerating diet w/o problems.  Cardiovascular: S1 and S2, no rubs or gallops  Respiratory: CTA bilaterally  Abdomen: soft, ND, positive BS and complaining of vague intermittent mid epigastric pain and lower quadrants bilaterally (pain free and asking for food during my examination)  Musculoskeletal: no edema or cyanosis   Discharge Instructions   Discharge Instructions    Diet - low sodium heart healthy    Complete by:  As directed  Discharge instructions    Complete by:  As directed   Follow heart healthy diet Take medications as prescribed Please make sure to follow lifestyle changes for reflux Arrange follow  up in 2 weeks with PCP Follow up in as needed basis with Dr. [name] for further medication adjustments and work up, for future episodes of CP Follow heart healthy diet          Current Discharge Medication List    START taking these medications   Details  carvedilol (COREG) 12.5 MG tablet Take 1 tablet (12.5 mg total) by mouth 2 (two) times daily with a meal. Qty: 60 tablet, Refills: 1    clopidogrel (PLAVIX) 75 MG tablet Take 1 tablet (75 mg total) by mouth daily. Qty: 30 tablet, Refills: 1    ranitidine (ZANTAC) 300 MG tablet Take 1 tablet (300 mg total) by mouth at bedtime. Qty: 30 tablet, Refills: 1    sucralfate (CARAFATE) 1 GM/10ML suspension Take 10 mLs (1 g total) by mouth 3 (three) times daily. Qty: 420 mL, Refills: 1      CONTINUE these medications which have CHANGED   Details  omeprazole (PRILOSEC) 20 MG capsule Take 2 capsules (40 mg total) by mouth 2 (two) times daily before a meal. Qty: 120 capsule, Refills: 1      CONTINUE these medications which have NOT CHANGED   Details  albuterol (PROAIR HFA) 108 (90 Base) MCG/ACT inhaler Inhale 2 puffs into the lungs every 6 (six) hours as needed for wheezing or shortness of breath.    aspirin 81 MG tablet Take 1 tablet (81 mg total) by mouth daily. Qty: 30 tablet, Refills: 1    atorvastatin (LIPITOR) 40 MG tablet Take 1 tablet (40 mg total) by mouth daily at 6 PM. Qty: 30 tablet, Refills: 1    colchicine (COLCRYS) 0.6 MG tablet Take 0.6 mg by mouth 2 (two) times daily as needed (for gout symptoms).    gabapentin (NEURONTIN) 300 MG capsule Take 300 mg by mouth 3 (three) times daily.     insulin glargine (LANTUS) 100 UNIT/ML injection Inject 50-60 Units into the skin 2 (two) times daily. 60 units in the morning and 50 units at bedtime    linagliptin (TRADJENTA) 5 MG TABS tablet Take 5 mg by mouth daily.    olmesartan-hydrochlorothiazide (BENICAR HCT) 40-25 MG per tablet Take 1 tablet by mouth daily.      traMADol  (ULTRAM) 50 MG tablet Take 50 mg by mouth 2 (two) times daily as needed for moderate pain.       Allergies  Allergen Reactions  . Codeine Rash  . Darvon [Propoxyphene] Rash  . Penicillins Rash   Follow-up Information    Follow up with [facility].   Why:  Registered Nurse and Physical Therapy   Contact information:   [address] [place] [address] [phone]       Follow up with Ezzard Standing, MD.   Specialty:  Cardiology   Why:  for CP As needed    Contact information:   [address] [place] [address] [phone]       The results of significant diagnostics from this hospitalization (including imaging, microbiology, ancillary and laboratory) are listed below for reference.    Significant Diagnostic Studies: Ct Abdomen Pelvis Wo Contrast  08/02/2015  CLINICAL DATA:  Abdominal pain. History of hypertension and diabetes. EXAM: CT ABDOMEN AND PELVIS WITHOUT CONTRAST TECHNIQUE: Multidetector CT imaging of the  abdomen and pelvis was performed following the standard protocol without IV contrast. COMPARISON:  [id number] FINDINGS: Italic. Unenhanced appearance of the liver, gallbladder, pancreas, inferior vena cava, and retroperitoneal lymph nodes is unremarkable. Sub cm exophytic lesion off of the spleen is new since previous study. Characterization is not possible due to small size. Interval development of a 3 cm diameter right adrenal gland nodule with poorly defined margins. Density measurements are intact. Exophytic lesions off of the right kidney, largest measuring 1.6 cm diameter. Density measures suggests a cyst. No hydronephrosis in either kidney. Calcification of the abdominal aorta without aneurysm. Stomach, small bowel, and colon are not abnormally distended. Stool fills the colon. No free air or free fluid in the abdomen. Pelvis: Diverticulosis of the sigmoid colon. No evidence of diverticulitis. Appendix is normal.  Bladder wall is not thickened. No free or loculated pelvic fluid collections. Surgical absence of the uterus. Left ovary is her aunt at 3.7 x 6 cm, enlarging since previous study. She sound for further by IMPRESSION: Developing 3 cm nodule in right adrenal gland. Hounsfield unit measurements are indeterminate and margins are somewhat poorly defined. Consider biochemical evaluation or MRI for further evaluation. New lesions in the right kidney and spleen, likely representing cysts. Enlarged left ovary. Suggest ultrasound follow-up. Electronically Signed   By: Lucienne Capers M.D.   On: 08/02/2015 05:37   Dg Chest 2 View  08/01/2015  CLINICAL DATA:  Heartburn for 1 week. Burning in chest and under left breast. EXAM: CHEST  2 VIEW COMPARISON:  Two-view chest x-ray 07/11/2015. FINDINGS: Heart size is normal. Lung volumes are low. No focal airspace disease present. There is no edema or effusion to suggest failure. The visualized soft tissues and bony thorax are unremarkable. IMPRESSION: 1. Low lung volumes. 2. No acute cardiopulmonary disease. Electronically Signed   By: San Morelle M.D.   On: 08/01/2015 21:16   Ct Head Wo Contrast  07/11/2015  CLINICAL DATA:  Stroke.  Right-sided weakness and slurred speech. EXAM: CT HEAD WITHOUT CONTRAST TECHNIQUE: Contiguous axial images were obtained from the base of the skull through the vertex without intravenous contrast. COMPARISON:  CT 06/22/2010, MRI 06/24/2010 FINDINGS: Moderate ventricular enlargement is slightly more prominent compared with 2012. Biventricular distance 49.6 mm today, 47.8 mm previously. There is dilatation of the temporal horns with rounded frontal horns. Third ventricle also rounded. Findings suggest hydrocephalus. Periventricular low-density also has progressed and may be due to transependymal resorption of CSF as well as some chronic microvascular ischemia. 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 No acute infarct.  No acute  hemorrhage or mass lesion. Calvarium intact. IMPRESSION: Progressive ventricular enlargement suggestive of hydrocephalus. This is greater than expected for the degree of atrophy and there is effacement of the sulci suggesting hydrocephalus. There may be periventricular resorption of CSF due to hydrocephalus No acute infarct These results were called by telephone at the time of interpretation on 07/11/2015 at 3:27 pm to Dr. Roland Rack , who verbally acknowledged these results. Electronically Signed   By: Franchot Gallo M.D.   On: 07/11/2015 15:28   Mr Brain Wo Contrast  07/11/2015  CLINICAL DATA:  Right facial droop, right-sided weakness, and speech abnormality. EXAM: MRI HEAD WITHOUT CONTRAST MRA HEAD WITHOUT CONTRAST TECHNIQUE: Multiplanar, multiecho pulse sequences of the brain and surrounding structures were obtained without intravenous contrast. Angiographic images of the head were obtained using MRA technique without contrast. COMPARISON:  Head CT 07/11/2015.  Head MRI/MRA 06/24/2010 FINDINGS: MRI HEAD FINDINGS The study  is mildly motion degraded. Partially empty sella is unchanged. There is a 6 mm acute lacunar infarct involving the lateral aspect of the left thalamus. No mass, midline shift, or extra-axial fluid collection. There is moderate enlargement of the lateral and third ventricles which is relatively similar to the prior MRI and remains out of proportion to the degree of sulcal enlargement. No obstructing lesion is identified at the level of the cerebral aqueduct. Confluent periventricular white matter T2 hyperintensities have minimally increased from the prior MRI and are nonspecific but may reflect moderate chronic small vessel ischemic disease, with transependymal CSF resorption considered less likely. There are chronic lacunar infarcts in the right thalamus and both cerebellar hemispheres, with the cerebellar infarcts new from the prior MRI and with chronic blood products associated  with a chronic infarct in the superior left cerebellum. Prior bilateral cataract extraction is noted. Paranasal sinuses and mastoid air cells are clear. Major intracranial vascular flow voids are preserved. MRA HEAD FINDINGS The study is mildly motion degraded. The visualized distal vertebral arteries are patent to the basilar with the left being slightly larger than the right. Left PICA origin is patent. Right PICA is not well seen. SCA origins are patent with chronic narrowing and attenuation of the proximal left SCA. Basilar artery is patent without stenosis. There is a patent left posterior communicating artery. PCAs are patent with unchanged, severe origins stenosis on the left and moderate P2 stenosis on the right. The internal carotid arteries are patent from skullbase to carotid termini with mild narrowing of the right ICA terminus which appears new. The right ICA is slightly small in caliber diffusely due to absence of the right A1 segment. The left A1 segment is widely patent and supplies the right A2. There is a mildly bulbous appearance of the left MCA trifurcation region, with assessment limited by motion. Linear band of decreased signal through the distal left M1 segment in this region is favored to be artifactual due to motion. The right M1 segment and right MCA bifurcation are widely patent without evidence of major MCA branch occlusion bilaterally. IMPRESSION: 1. Acute left thalamic lacunar infarct. 2. Chronic, moderate ventriculomegaly which may reflect central predominant cerebral atrophy or hydrocephalus. Minimally increased periventricular white matter T2 changes from 2012 favored to reflect chronic small vessel ischemia. 3. No major intracranial arterial occlusion. Atherosclerotic changes with new, mild right ICA terminus stenosis. 4. Unchanged bilateral PCA stenoses. Electronically Signed   By: Logan Bores M.D.   On: 07/11/2015 18:42   Mr Jodene Nam Head/brain Wo Cm  07/11/2015  CLINICAL DATA:   Right facial droop, right-sided weakness, and speech abnormality. EXAM: MRI HEAD WITHOUT CONTRAST MRA HEAD WITHOUT CONTRAST TECHNIQUE: Multiplanar, multiecho pulse sequences of the brain and surrounding structures were obtained without intravenous contrast. Angiographic images of the head were obtained using MRA technique without contrast. COMPARISON:  Head CT 07/11/2015.  Head MRI/MRA 06/24/2010 FINDINGS: MRI HEAD FINDINGS The study is mildly motion degraded. Partially empty sella is unchanged. There is a 6 mm acute lacunar infarct involving the lateral aspect of the left thalamus. No mass, midline shift, or extra-axial fluid collection. There is moderate enlargement of the lateral and third ventricles which is relatively similar to the prior MRI and remains out of proportion to the degree of sulcal enlargement. No obstructing lesion is identified at the level of the cerebral aqueduct. Confluent periventricular white matter T2 hyperintensities have minimally increased from the prior MRI and are nonspecific but may reflect moderate chronic small vessel  ischemic disease, with transependymal CSF resorption considered less likely. There are chronic lacunar infarcts in the right thalamus and both cerebellar hemispheres, with the cerebellar infarcts new from the prior MRI and with chronic blood products associated with a chronic infarct in the superior left cerebellum. Prior bilateral cataract extraction is noted. Paranasal sinuses and mastoid air cells are clear. Major intracranial vascular flow voids are preserved. MRA HEAD FINDINGS The study is mildly motion degraded. The visualized distal vertebral arteries are patent to the basilar with the left being slightly larger than the right. Left PICA origin is patent. Right PICA is not well seen. SCA origins are patent with chronic narrowing and attenuation of the proximal left SCA. Basilar artery is patent without stenosis. There is a patent left posterior communicating  artery. PCAs are patent with unchanged, severe origins stenosis on the left and moderate P2 stenosis on the right. The internal carotid arteries are patent from skullbase to carotid termini with mild narrowing of the right ICA terminus which appears new. The right ICA is slightly small in caliber diffusely due to absence of the right A1 segment. The left A1 segment is widely patent and supplies the right A2. There is a mildly bulbous appearance of the left MCA trifurcation region, with assessment limited by motion. Linear band of decreased signal through the distal left M1 segment in this region is favored to be artifactual due to motion. The right M1 segment and right MCA bifurcation are widely patent without evidence of major MCA branch occlusion bilaterally. IMPRESSION: 1. Acute left thalamic lacunar infarct. 2. Chronic, moderate ventriculomegaly which may reflect central predominant cerebral atrophy or hydrocephalus. Minimally increased periventricular white matter T2 changes from 2012 favored to reflect chronic small vessel ischemia. 3. No major intracranial arterial occlusion. Atherosclerotic changes with new, mild right ICA terminus stenosis. 4. Unchanged bilateral PCA stenoses. Electronically Signed   By: Logan Bores M.D.   On: 07/11/2015 18:42    Microbiology: Recent Results (from the past 240 hour(s))  MRSA PCR Screening     Status: None   Collection Time: 08/02/15  1:58 AM  Result Value Ref Range Status   MRSA by PCR NEGATIVE NEGATIVE Final    Comment:        The GeneXpert MRSA Assay (FDA approved for NASAL specimens only), is one component of a comprehensive MRSA colonization surveillance program. It is not intended to diagnose MRSA infection nor to guide or monitor treatment for MRSA infections.      Labs: Basic Metabolic Panel:  Recent Labs Lab 08/01/15 2051 08/02/15 0225 08/04/15 1644  NA 136  --  138  K 2.8*  --  3.9  CL 102  --  105  CO2 25  --  24  GLUCOSE 205*   --  112*  BUN 15  --  16  CREATININE 1.36*  --  1.41*  CALCIUM 9.8  --  9.9  MG  --  1.8  --   PHOS  --  2.7  --    Liver Function Tests:  Recent Labs Lab 08/01/15 2051  AST 22  ALT 24  ALKPHOS 84  BILITOT 0.5  PROT 7.4  ALBUMIN 3.9    Recent Labs Lab 08/01/15 2051 08/02/15 0225  LIPASE 80* 56*   CBC:  Recent Labs Lab 08/01/15 2051 08/04/15 1644  WBC 10.2 9.7  HGB 14.7 14.4  HCT 44.3 44.5  MCV 79.7 80.9  PLT 221 201   Cardiac Enzymes:  Recent Labs Lab 08/01/15  2051 08/02/15 0225 08/02/15 0624  TROPONINI 0.13* 0.16* 0.11*   CBG:  Recent Labs Lab 08/04/15 1137 08/04/15 1620 08/04/15 1951 08/05/15 0725 08/05/15 1121  GLUCAP 204* 125* 115* 85 197*    Signed:  [name] MD.  [facility] 08/05/2015, 1:25 PM

## 2015-08-05 NOTE — Progress Notes (Addendum)
Subjective:  She has done well over the past couple days.  She was ambulatory in the hall without chest discomfort.  Quite hard appearing and still difficult to get a history but no definite angina.  Objective:  Vital Signs in the last 24 hours: BP 121/93 mmHg  Pulse 54  Temp(Src) 98 F (36.7 C) (Oral)  Resp 18  Ht 5\' 4"  (1.626 m)  Wt 75.161 kg (165 lb 11.2 oz)  BMI 28.43 kg/m2  SpO2 100%  Physical Exam: Elderly black female very hard of hearing currently in no acute distress Lungs:  Clear Cardiac:  Regular rhythm, normal S1 and S2, no S3 Extremities:  No edema present  Intake/Output from previous day: 07/17 0701 - 07/18 0700 In: 480 [P.O.:480] Out: 200 [Urine:200]  Weight Filed Weights   08/03/15 0400 08/04/15 0400 08/05/15 0349  Weight: 75.116 kg (165 lb 9.6 oz) 74.844 kg (165 lb) 75.161 kg (165 lb 11.2 oz)    Lab Results: Basic Metabolic Panel:  Recent Labs  08/04/15 1644  NA 138  K 3.9  CL 105  CO2 24  GLUCOSE 112*  BUN 16  CREATININE 1.41*   CBC:  Recent Labs  08/04/15 1644  WBC 9.7  HGB 14.4  HCT 44.5  MCV 80.9  PLT 201   Telemetry: Normal sinus rhythm  EKG today shows some T wave inversions in the lateral leads that are new  Assessment/Plan:  1.  Abdominal tenderness and discomfort that has resolved 2.  Low level elevation of troponin uncertain whether this is ischemia due to demand-the clinical presentation is atypical but she has new EKG changes in the lateral leads from the admission EKG. 3.  Hypertensive heart disease 4.  Insulin-dependent diabetes mellitus 5.  Recent thalamic infarction 6.  Abdominal aortic atherosclerosis  Recommendations:  The patient has had no recurrence of chest pain.  Unclear whether this is angina at this time.  I would add clopidogrel to her regimen.  I think that she should be managed intensively medically with statins, aspirin and clopidogrel and may be discharged home.  I don't know that ischemic risk  stratification with a recent stroke is going to be helpful unless we're willing to go down the revascularization route with a recent stroke.  Would like to see what her clinical status is down the road in light of her other comorbidities.  I can see in the office if needed.  She does calcification of her other arteries and likely does have some underlying coronary artery disease.   Kerry Hough  MD [facility]  08/05/2015, 12:23 PM

## 2015-08-05 NOTE — Progress Notes (Addendum)
  Pt discharged. Bed allarm was off, pt friend in the room. Call bell was at bedside pt alert X4 instructed on the use of the call bell, had been using it all day. Pt got up without calling for assistance, friend was in the room assisted pt to bathroom, tech was called in from friend.  Pt was found laying on the bathroom floor by [place] Ma, said she slid herself down because her legs felt tired and she did not want to get up on her own. Josh RN, and I assisted pt up. Dawn charged called into room. Pt was down for approx  1 minute.  Pt uninjured, continues to say she slid herself down on floor and because she was feeling weak. Dr. Dyann Kief paged, no further followup. Pt daughter and tech arrived to floor five minutes after event, informed, daughter stated 'she does what she wants", was not surprised. Pt re-educated on importance of asking for assistance. Bed alarm turned on with family in room. Discharge reviewed again with daughter. Assisted pt to dress. Rayburn Ma wheeled pt out, no complications.

## 2015-08-22 ENCOUNTER — Emergency Department (HOSPITAL_COMMUNITY): Payer: Medicare Other

## 2015-08-22 ENCOUNTER — Inpatient Hospital Stay (HOSPITAL_COMMUNITY)
Admission: EM | Admit: 2015-08-22 | Discharge: 2015-08-25 | DRG: 682 | Disposition: A | Discharge status: Home Health | Payer: Medicare Other | Attending: Internal Medicine | Admitting: Internal Medicine

## 2015-08-22 ENCOUNTER — Encounter (HOSPITAL_COMMUNITY): Payer: Self-pay | Admitting: Emergency Medicine

## 2015-08-22 DIAGNOSIS — Z87891 Personal history of nicotine dependence: Secondary | ICD-10-CM

## 2015-08-22 DIAGNOSIS — R410 Disorientation, unspecified: Secondary | ICD-10-CM | POA: Diagnosis present

## 2015-08-22 DIAGNOSIS — Z66 Do not resuscitate: Secondary | ICD-10-CM | POA: Diagnosis present

## 2015-08-22 DIAGNOSIS — G9340 Encephalopathy, unspecified: Secondary | ICD-10-CM | POA: Diagnosis present

## 2015-08-22 DIAGNOSIS — Z880 Allergy status to penicillin: Secondary | ICD-10-CM | POA: Diagnosis not present

## 2015-08-22 DIAGNOSIS — N183 Chronic kidney disease, stage 3 unspecified: Secondary | ICD-10-CM

## 2015-08-22 DIAGNOSIS — E860 Dehydration: Secondary | ICD-10-CM | POA: Diagnosis present

## 2015-08-22 DIAGNOSIS — Z888 Allergy status to other drugs, medicaments and biological substances status: Secondary | ICD-10-CM | POA: Diagnosis not present

## 2015-08-22 DIAGNOSIS — Z794 Long term (current) use of insulin: Secondary | ICD-10-CM

## 2015-08-22 DIAGNOSIS — J438 Other emphysema: Secondary | ICD-10-CM

## 2015-08-22 DIAGNOSIS — I129 Hypertensive chronic kidney disease with stage 1 through stage 4 chronic kidney disease, or unspecified chronic kidney disease: Secondary | ICD-10-CM | POA: Diagnosis present

## 2015-08-22 DIAGNOSIS — H9190 Unspecified hearing loss, unspecified ear: Secondary | ICD-10-CM | POA: Diagnosis present

## 2015-08-22 DIAGNOSIS — E1122 Type 2 diabetes mellitus with diabetic chronic kidney disease: Secondary | ICD-10-CM | POA: Diagnosis present

## 2015-08-22 DIAGNOSIS — Z7902 Long term (current) use of antithrombotics/antiplatelets: Secondary | ICD-10-CM | POA: Diagnosis not present

## 2015-08-22 DIAGNOSIS — E785 Hyperlipidemia, unspecified: Secondary | ICD-10-CM | POA: Diagnosis present

## 2015-08-22 DIAGNOSIS — E1165 Type 2 diabetes mellitus with hyperglycemia: Secondary | ICD-10-CM

## 2015-08-22 DIAGNOSIS — Z8673 Personal history of transient ischemic attack (TIA), and cerebral infarction without residual deficits: Secondary | ICD-10-CM

## 2015-08-22 DIAGNOSIS — N179 Acute kidney failure, unspecified: Secondary | ICD-10-CM | POA: Diagnosis present

## 2015-08-22 DIAGNOSIS — Z885 Allergy status to narcotic agent status: Secondary | ICD-10-CM

## 2015-08-22 DIAGNOSIS — I119 Hypertensive heart disease without heart failure: Secondary | ICD-10-CM | POA: Diagnosis present

## 2015-08-22 DIAGNOSIS — J449 Chronic obstructive pulmonary disease, unspecified: Secondary | ICD-10-CM | POA: Diagnosis present

## 2015-08-22 DIAGNOSIS — Z7982 Long term (current) use of aspirin: Secondary | ICD-10-CM

## 2015-08-22 DIAGNOSIS — Z79899 Other long term (current) drug therapy: Secondary | ICD-10-CM

## 2015-08-22 DIAGNOSIS — Z833 Family history of diabetes mellitus: Secondary | ICD-10-CM

## 2015-08-22 LAB — BLOOD GAS, VENOUS

## 2015-08-22 LAB — CBC WITH DIFFERENTIAL/PLATELET
Basophils Absolute: 0 K/uL (ref 0.0–0.1)
Basophils Relative: 0 %
Eosinophils Absolute: 0.2 K/uL (ref 0.0–0.7)
Eosinophils Relative: 1 %
HCT: 43.3 % (ref 36.0–46.0)
Hemoglobin: 14.7 g/dL (ref 12.0–15.0)
Lymphocytes Relative: 18 %
Lymphs Abs: 2 K/uL (ref 0.7–4.0)
MCH: 27 pg (ref 26.0–34.0)
MCHC: 33.9 g/dL (ref 30.0–36.0)
MCV: 79.6 fL (ref 78.0–100.0)
Monocytes Absolute: 0.7 K/uL (ref 0.1–1.0)
Monocytes Relative: 6 %
Neutro Abs: 8.3 K/uL — ABNORMAL HIGH (ref 1.7–7.7)
Neutrophils Relative %: 75 %
Platelets: 198 K/uL (ref 150–400)
RBC: 5.44 MIL/uL — ABNORMAL HIGH (ref 3.87–5.11)
RDW: 16.1 % — ABNORMAL HIGH (ref 11.5–15.5)
WBC: 11.2 K/uL — ABNORMAL HIGH (ref 4.0–10.5)

## 2015-08-22 LAB — I-STAT VENOUS BLOOD GAS, ED
ACID-BASE DEFICIT: 6 mmol/L — AB (ref 0.0–2.0)
BICARBONATE: 17.6 meq/L — AB (ref 20.0–24.0)
O2 Saturation: 80 %
TCO2: 19 mmol/L (ref 0–100)
pCO2, Ven: 29.9 mmHg — ABNORMAL LOW (ref 45.0–50.0)
pH, Ven: 7.379 — ABNORMAL HIGH (ref 7.250–7.300)
pO2, Ven: 44 mmHg (ref 31.0–45.0)

## 2015-08-22 LAB — COMPREHENSIVE METABOLIC PANEL
ALK PHOS: 118 U/L (ref 38–126)
ALT: 37 U/L (ref 14–54)
ANION GAP: 14 (ref 5–15)
AST: 53 U/L — AB (ref 15–41)
Albumin: 3.6 g/dL (ref 3.5–5.0)
BILIRUBIN TOTAL: 0.8 mg/dL (ref 0.3–1.2)
BUN: 93 mg/dL — AB (ref 6–20)
CALCIUM: 9.8 mg/dL (ref 8.9–10.3)
CO2: 19 mmol/L — ABNORMAL LOW (ref 22–32)
Chloride: 103 mmol/L (ref 101–111)
Creatinine, Ser: 4.06 mg/dL — ABNORMAL HIGH (ref 0.44–1.00)
GFR calc Af Amer: 11 mL/min — ABNORMAL LOW (ref >60)
GFR, EST NON AFRICAN AMERICAN: 10 mL/min — AB (ref >60)
Glucose, Bld: 122 mg/dL — ABNORMAL HIGH (ref 65–99)
POTASSIUM: 4 mmol/L (ref 3.5–5.1)
Sodium: 136 mmol/L (ref 135–145)
TOTAL PROTEIN: 7.4 g/dL (ref 6.5–8.1)

## 2015-08-22 LAB — URINALYSIS, ROUTINE W REFLEX MICROSCOPIC
Glucose, UA: NEGATIVE mg/dL
Ketones, ur: 15 mg/dL — AB
Leukocytes, UA: NEGATIVE
Nitrite: NEGATIVE
Protein, ur: NEGATIVE mg/dL
Specific Gravity, Urine: 1.023 (ref 1.005–1.030)
pH: 5 (ref 5.0–8.0)

## 2015-08-22 LAB — URINE MICROSCOPIC-ADD ON

## 2015-08-22 LAB — I-STAT CHEM 8, ED
BUN: 88 mg/dL — ABNORMAL HIGH (ref 6–20)
CREATININE: 4.2 mg/dL — AB (ref 0.44–1.00)
Calcium, Ion: 1.11 mmol/L — ABNORMAL LOW (ref 1.12–1.23)
Chloride: 105 mmol/L (ref 101–111)
GLUCOSE: 124 mg/dL — AB (ref 65–99)
HCT: 46 % (ref 36.0–46.0)
HEMOGLOBIN: 15.6 g/dL — AB (ref 12.0–15.0)
POTASSIUM: 4 mmol/L (ref 3.5–5.1)
Sodium: 138 mmol/L (ref 135–145)
TCO2: 19 mmol/L (ref 0–100)

## 2015-08-22 LAB — AMMONIA: AMMONIA: 18 umol/L (ref 9–35)

## 2015-08-22 LAB — LIPASE, BLOOD: LIPASE: 21 U/L (ref 11–51)

## 2015-08-22 LAB — GLUCOSE, CAPILLARY: Glucose-Capillary: 154 mg/dL — ABNORMAL HIGH (ref 65–99)

## 2015-08-22 MED ORDER — SODIUM CHLORIDE 0.9 % IV SOLN
INTRAVENOUS | Status: DC
Start: 2015-08-22 — End: 2015-08-22

## 2015-08-22 MED ORDER — SODIUM CHLORIDE 0.9 % IV SOLN
INTRAVENOUS | Status: DC
  Administered 2015-08-22 – 2015-08-25 (×3): via INTRAVENOUS

## 2015-08-22 MED ORDER — ONDANSETRON HCL 4 MG/2ML IJ SOLN: 4.0000 mg | Freq: Four times a day (QID) | INTRAMUSCULAR | Status: DC | PRN

## 2015-08-22 MED ORDER — ASPIRIN 81 MG PO CHEW
81.0000 mg | CHEWABLE_TABLET | Freq: Every evening | ORAL | Status: DC
  Administered 2015-08-22 – 2015-08-24 (×3): 81 mg via ORAL
  Filled 2015-08-22 (×3): qty 1

## 2015-08-22 MED ORDER — HEPARIN SODIUM (PORCINE) 5000 UNIT/ML IJ SOLN
5000.0000 [IU] | Freq: Three times a day (TID) | INTRAMUSCULAR | Status: DC
  Administered 2015-08-22 – 2015-08-25 (×8): 5000 [IU] via SUBCUTANEOUS
  Filled 2015-08-22 (×8): qty 1

## 2015-08-22 MED ORDER — ALBUTEROL SULFATE (2.5 MG/3ML) 0.083% IN NEBU: 3.0000 mL | INHALATION_SOLUTION | Freq: Four times a day (QID) | RESPIRATORY_TRACT | Status: DC | PRN

## 2015-08-22 MED ORDER — INSULIN ASPART 100 UNIT/ML ~~LOC~~ SOLN
0.0000 [IU] | Freq: Three times a day (TID) | SUBCUTANEOUS | Status: DC
  Administered 2015-08-23: 2 [IU] via SUBCUTANEOUS
  Administered 2015-08-23: 1 [IU] via SUBCUTANEOUS
  Administered 2015-08-23: 2 [IU] via SUBCUTANEOUS
  Administered 2015-08-24 (×2): 1 [IU] via SUBCUTANEOUS
  Administered 2015-08-25: 2 [IU] via SUBCUTANEOUS

## 2015-08-22 MED ORDER — ACETAMINOPHEN 325 MG PO TABS: 650.0000 mg | ORAL_TABLET | Freq: Four times a day (QID) | ORAL | Status: DC | PRN

## 2015-08-22 MED ORDER — PANTOPRAZOLE SODIUM 40 MG PO TBEC
40.0000 mg | DELAYED_RELEASE_TABLET | Freq: Every day | ORAL | Status: DC
  Administered 2015-08-24 – 2015-08-25 (×2): 40 mg via ORAL
  Filled 2015-08-22 (×3): qty 1

## 2015-08-22 MED ORDER — PREDNISOLONE ACETATE 1 % OP SUSP
1.0000 [drp] | Freq: Four times a day (QID) | OPHTHALMIC | Status: DC
  Administered 2015-08-22 – 2015-08-25 (×9): 1 [drp] via OPHTHALMIC
  Filled 2015-08-22: qty 1

## 2015-08-22 MED ORDER — SODIUM CHLORIDE 0.9 % IV BOLUS (SEPSIS)
1000.0000 mL | Freq: Once | INTRAVENOUS | Status: AC
  Administered 2015-08-22: 1000 mL via INTRAVENOUS

## 2015-08-22 MED ORDER — CARVEDILOL 6.25 MG PO TABS
6.2500 mg | ORAL_TABLET | Freq: Two times a day (BID) | ORAL | Status: DC
  Administered 2015-08-23 – 2015-08-25 (×4): 6.25 mg via ORAL
  Filled 2015-08-22 (×5): qty 1

## 2015-08-22 MED ORDER — HALOPERIDOL LACTATE 5 MG/ML IJ SOLN: 2.0000 mg | Freq: Four times a day (QID) | INTRAMUSCULAR | Status: DC | PRN

## 2015-08-22 MED ORDER — ATORVASTATIN CALCIUM 40 MG PO TABS
40.0000 mg | ORAL_TABLET | Freq: Every day | ORAL | Status: DC
Start: 2015-08-23 — End: 2015-08-25
  Administered 2015-08-23 – 2015-08-24 (×2): 40 mg via ORAL
  Filled 2015-08-22 (×2): qty 1

## 2015-08-22 MED ORDER — INSULIN ASPART 100 UNIT/ML ~~LOC~~ SOLN: 0.0000 [IU] | Freq: Every day | SUBCUTANEOUS | Status: DC

## 2015-08-22 MED ORDER — SUCRALFATE 1 GM/10ML PO SUSP
1.0000 g | Freq: Three times a day (TID) | ORAL | Status: DC
  Administered 2015-08-22 – 2015-08-25 (×7): 1 g via ORAL
  Filled 2015-08-22 (×8): qty 10

## 2015-08-22 MED ORDER — ACETAMINOPHEN 650 MG RE SUPP: 650.0000 mg | RECTAL | Status: DC | PRN

## 2015-08-22 MED ORDER — CLOPIDOGREL BISULFATE 75 MG PO TABS
75.0000 mg | ORAL_TABLET | Freq: Every day | ORAL | Status: DC
Start: 2015-08-23 — End: 2015-08-25
  Administered 2015-08-23 – 2015-08-25 (×3): 75 mg via ORAL
  Filled 2015-08-22 (×5): qty 1

## 2015-08-22 NOTE — ED Notes (Signed)
Attempted to call report x 1  

## 2015-08-22 NOTE — ED Provider Notes (Signed)
[facility] DEPT Provider Note   CSN: [id number] Arrival date & time: 08/22/15  1357  First Provider Contact:  First MD Initiated Contact with Patient 08/22/15 1457     History   Chief Complaint Chief Complaint  Patient presents with  . Altered Mental Status   HPI Paula West is a 82 y.o. female.  The history is provided by medical records and the patient. No language interpreter was used.  Altered Mental Status   This is a new problem. The current episode started more than 2 days ago. The problem has been gradually worsening. Associated symptoms include confusion, somnolence and unresponsiveness. Pertinent negatives include no weakness, no agitation, no delusions, no self-injury and no violence. Her past medical history is significant for CVA and COPD.   Level V Caveat due to patient's condition of somnolent, altered mental status.  Past Medical History:  Diagnosis Date  . Asthma   . CKD (chronic kidney disease), stage II   . COPD (chronic obstructive pulmonary disease) ([place])   . Diabetes mellitus   . Hyperlipidemia   . Hypertension     Patient Active Problem List   Diagnosis Date Noted  . AKI (acute kidney injury) ([place]) 08/22/2015  . Esophageal reflux   . Chest pain 08/02/2015  . Abdominal pain, epigastric 08/02/2015  . Abdominal aortic atherosclerosis ([place]) 08/02/2015  . Elevated troponin   . Chronic obstructive pulmonary disease ([place])   . Thalamic infarction ([place]) 07/12/2015  . Type 2 diabetes mellitus with hyperglycemia, with long-term current use of insulin ([place]) 07/12/2015  . CKD (chronic kidney disease), stage III 07/12/2015  . Hypercalcemia 07/12/2015  . Asthma   . COPD (chronic obstructive pulmonary disease) ([place])   . Hypertensive heart disease without CHF   . Hyperlipidemia   . Diabetes ([place])   . CKD (chronic kidney disease), stage II   . Essential hypertension     History reviewed. No pertinent surgical history.  OB History    No data  available       Home Medications    Prior to Admission medications   Medication Sig Start Date End Date Taking? Authorizing Provider  ACCU-CHEK AVIVA PLUS test strip As directed 07/02/15  Yes Historical Provider, MD  ACCU-CHEK SOFTCLIX LANCETS lancets As directed 07/02/15  Yes Historical Provider, MD  albuterol (PROAIR HFA) 108 (90 Base) MCG/ACT inhaler Inhale 2 puffs into the lungs every 6 (six) hours as needed for wheezing or shortness of breath.   Yes Historical Provider, MD  aspirin 81 MG tablet Take 1 tablet (81 mg total) by mouth daily. 07/12/15  Yes [name], MD  atorvastatin (LIPITOR) 40 MG tablet Take 1 tablet (40 mg total) by mouth daily at 6 PM. 07/13/15  Yes [name], MD  carvedilol (COREG) 12.5 MG tablet Take 1 tablet (12.5 mg total) by mouth 2 (two) times daily with a meal. 08/05/15  Yes [name], MD  clopidogrel (PLAVIX) 75 MG tablet Take 1 tablet (75 mg total) by mouth daily. 08/05/15  Yes [name], MD  colchicine (COLCRYS) 0.6 MG tablet Take 0.6 mg by mouth 2 (two) times daily as needed (for gout symptoms).   Yes Historical Provider, MD  gabapentin (NEURONTIN) 300 MG capsule Take 300 mg by mouth 3 (three) times daily.    Yes Historical Provider, MD  insulin glargine (LANTUS) 100 UNIT/ML injection Inject 50-60 Units into the skin 2 (two) times daily. 60 units in the morning and 50 units at bedtime depending on BGL   Yes  Historical Provider, MD  linagliptin (TRADJENTA) 5 MG TABS tablet Take 5 mg by mouth daily.   Yes Historical Provider, MD  olmesartan-hydrochlorothiazide (BENICAR HCT) 40-25 MG per tablet Take 1 tablet by mouth daily.     Yes Historical Provider, MD  omeprazole (PRILOSEC) 20 MG capsule Take 2 capsules (40 mg total) by mouth 2 (two) times daily before a meal. Patient taking differently: Take 20 mg by mouth 2 (two) times daily before a meal.  08/05/15  Yes [name], MD  prednisoLONE acetate (PRED FORTE) 1 % ophthalmic suspension Place 1 drop into both  eyes 4 (four) times daily. 08/12/15  Yes Historical Provider, MD  ranitidine (ZANTAC) 300 MG tablet Take 1 tablet (300 mg total) by mouth at bedtime. 08/05/15  Yes [name], MD  sucralfate (CARAFATE) 1 GM/10ML suspension Take 10 mLs (1 g total) by mouth 3 (three) times daily. 08/05/15  Yes [name], MD  traMADol (ULTRAM) 50 MG tablet Take 50 mg by mouth 2 (two) times daily as needed for moderate pain.   Yes Historical Provider, MD  trimethoprim-polymyxin b (POLYTRIM) ophthalmic solution INSTILL 1 DROP IN LEFT EYE 4 TIMES DAILY FOR 2 DAYS AFTER EACH MONTHLY EYE INJECTION 06/23/15  Yes Historical Provider, MD    Family History Family History  Problem Relation Age of Onset  . Diabetes Sister   . Stroke Mother   . CAD Neg Hx     Social History Social History  Substance Use Topics  . Smoking status: Former Research scientist (life sciences)  . Smokeless tobacco: Former Systems developer  . Alcohol use No     Allergies   Codeine; Darvon [propoxyphene]; and Penicillins   Review of Systems Review of Systems  Unable to perform ROS: Mental status change  Neurological: Negative for weakness.  Psychiatric/Behavioral: Positive for confusion. Negative for agitation and self-injury.     Physical Exam Updated Vital Signs BP 111/66 (BP Location: Right Arm)   Pulse 68   Temp 98.5 F (36.9 C)   Resp 22   SpO2 96%   Physical Exam  Constitutional: She appears well-developed and well-nourished. No distress.  HENT:  Head: Normocephalic and atraumatic.  Eyes: Conjunctivae are normal.  Neck: Neck supple.  Cardiovascular: Normal rate and regular rhythm.   No murmur heard. Pulmonary/Chest: Effort normal and breath sounds normal. No respiratory distress.  Abdominal: Soft. She exhibits no distension. There is no tenderness. There is no guarding.  Musculoskeletal: She exhibits no edema.  Neurological:  Somnolent but arousable. Difficulty following commands.   Skin: Skin is warm and dry.  Nursing note and vitals  reviewed.    ED Treatments / Results  Labs (all labs ordered are listed, but only abnormal results are displayed) Labs Reviewed  CBC WITH DIFFERENTIAL/PLATELET - Abnormal; Notable for the following:       Result Value   WBC 11.2 (*)    RBC 5.44 (*)    RDW 16.1 (*)    Neutro Abs 8.3 (*)    All other components within normal limits  COMPREHENSIVE METABOLIC PANEL - Abnormal; Notable for the following:    CO2 19 (*)    Glucose, Bld 122 (*)    BUN 93 (*)    Creatinine, Ser 4.06 (*)    AST 53 (*)    GFR calc non Af Amer 10 (*)    GFR calc Af Amer 11 (*)    All other components within normal limits  I-STAT VENOUS BLOOD GAS, ED - Abnormal; Notable for the following:  pH, Ven 7.379 (*)    pCO2, Ven 29.9 (*)    Bicarbonate 17.6 (*)    Acid-base deficit 6.0 (*)    All other components within normal limits  I-STAT CHEM 8, ED - Abnormal; Notable for the following:    BUN 88 (*)    Creatinine, Ser 4.20 (*)    Glucose, Bld 124 (*)    Calcium, Ion 1.11 (*)    Hemoglobin 15.6 (*)    All other components within normal limits  BLOOD GAS, VENOUS  AMMONIA  LIPASE, BLOOD  URINALYSIS, ROUTINE W REFLEX MICROSCOPIC (NOT AT [facility])  Randolm Idol, ED    EKG  EKG Interpretation None       Radiology Dg Chest 2 View  Result Date: 08/22/2015 CLINICAL DATA:  Altered mental status EXAM: CHEST  2 VIEW COMPARISON:  08/01/2015 FINDINGS: Cardiac shadow is not enlarged and accentuated by AP technique. No infiltrate or sizable effusion is seen. No acute bony abnormality is noted. Degenerative changes of the thoracic spine are seen. IMPRESSION: No active cardiopulmonary disease. Electronically Signed   By: Inez Catalina M.D.   On: 08/22/2015 15:14   Ct Head Wo Contrast  Result Date: 08/22/2015 CLINICAL DATA:  82 year old brought in for altered mental status and unresponsiveness. EXAM: CT HEAD WITHOUT CONTRAST TECHNIQUE: Contiguous axial images were obtained from the base of the skull through the  vertex without intravenous contrast. COMPARISON:  07/11/2015 FINDINGS: There is moderate chronic ventriculomegaly which appears similar to previous exam. As described previously this remains outer for portion to the degree of sulcal enlargement. Patchy areas of low attenuation throughout the subcortical and periventricular white matter are identified compatible with chronic microvascular disease. Chronic left basal ganglia lacunar infarct is noted. No evidence for acute brain infarct. No intracranial hemorrhage or mass identified. The paranasal sinuses and mastoid air cells appear clear. The calvarium is intact. IMPRESSION: 1. No acute intracranial abnormalities. 2. Chronic, moderate ventriculomegaly 3. Chronic small vessel ischemic change. Electronically Signed   By: Kerby Moors M.D.   On: 08/22/2015 15:10    Procedures Procedures (including critical care time)  Medications Ordered in ED Medications  sodium chloride 0.9 % bolus 1,000 mL (1,000 mLs Intravenous New Bag/Given 08/22/15 1746)     Initial Impression / Assessment and Plan / ED Course  I have reviewed the triage vital signs and the nursing notes.  Pertinent labs & imaging results that were available during my care of the patient were reviewed by me and considered in my medical decision making (see chart for details).  Clinical Course    Patient is an 82 year old female with recent discharge after hospital stay for elevated troponin, recent ischemic stroke. Patient with chief complaint of altered mental status, brought by EMS.  Patient afebrile in no acute distress. She is somnolent and minimally responsive my examination. She appears to wake up and attempt to speak however is unable to. CT scan performed urgently with no acute findings. Chest x-ray with no acute cardio pulmonary abnormality. Patient with a soft nontender abdominal exam. Lungs Clear to auscultation bilaterally.  Obtained much of history from patient's daughter over  the telephone. Per patient's daughter patient has had difficulty eating, drinking over the past 2 days. She extremely somnolent versus nonresponsive at home which prompted EMS call today. Patient's daughter denies any falls or acute events that precipitated these symptoms.  Differential diagnosis is broad at this time and includes intracranial process, electrolyte abnormality, renal failure, failure to thrive, UTI.  We'll  obtain broad laboratory studies including CMP, ammonia, VBG, UA and reevaluate patient's condition with these laboratory studies.  EKG obtained and reviewed which shows rate 75, PR interval 215, QRS 102. No acute ischemic changes or T-wave inversions noted on EKG.  5:40 PM: Labs reviewed. Patient with elevated creatinine to 4.2, BUN to 88. Altered mental status picture likely due to acute renal failure, uremia. Renal failure appears prerenal with BUN to creatinine ratio greater than 20. Fluids ordered and given the patient. Paged hospitalist for admission due to altered mental status, AK I.  Discussed with my attending, Dr. Laneta Simmers.    Final Clinical Impressions(s) / ED Diagnoses   Final diagnoses:  Delirium  AKI (acute kidney injury) [facility])    New Prescriptions New Prescriptions   No medications on file     Vira Blanco, MD 08/22/15 [id number]    Leo Grosser, MD 08/23/15 [phone]

## 2015-08-22 NOTE — ED Triage Notes (Signed)
Pt here from home with c/o aloc , pt had a stroke 2 weeks ago and family states that pt has become more altered over the past two days cbg  101

## 2015-08-22 NOTE — ED Provider Notes (Signed)
MSE was initiated and I personally evaluated the patient and placed orders (if any) at  2:31 PM on August 22, 2015.    HPI Level V Caveat due to altered mental status and poor historian  Paula West is an 82 y.o. female with history of stroke (left thalamic lacunar infarct 06/2015), CAD (with NSTEMI 08/01/15), COPD, DM, CKD who presents to the ED for evaluation of altered mental status. Her family is not at bedside and it is unknown if they are on the way to the hospital. History obtained via EMS. Per EMS, family reported two days of increased lethargy and unresponsiveness. It is unclear what pt's baseline is but on EMR review pt has been very hard of hearing and a poor historian in the past. Pt will only yes or no to some questions in the ED. She denies pain. She states she feels "fine." She will not otherwise respond to questions. She will only respond to some commands.  Physical Exam  Constitutional: No distress.  HENT:  Head: Atraumatic.  MM dry Some food particles on tongue Airway patent  Eyes: Conjunctivae and EOM are normal. Pupils are equal, round, and reactive to light.  Will make eye contact and follow with gaze  Cardiovascular: Normal rate, regular rhythm and normal heart sounds.   Pulmonary/Chest: Effort normal. No respiratory distress.  Abdominal: Soft. She exhibits no distension. There is no tenderness.  Musculoskeletal:  No LE edema  Lymphadenopathy:    She has no cervical adenopathy.  Neurological: She is alert.  Sleeping. Will wake to pain. Will wake to loud stimuli. Will lift left arm. Will grip with left hand. Will not or cannot move right arm. Will not move lower extremities. Will not respond to orientation questions  Skin: Skin is warm and dry. She is not diaphoretic.  Nursing note and vitals reviewed.   I am not sure of pt's baseline. She is drowsy but arousable to pain and to loud stimuli. She seems very hard of hearing. I am unclear of pt's baseline. Will  place orders to evaluate for altered mental status. Pt is elderly with several major medical co-morbidities. She is currently hemodynamically stable.   The patient appears stable so that the remainder of the MSE may be completed by another provider.    Anne Ng, PA-C 08/22/15 [place], PA-C 08/22/15 1447    Sherwood Gambler, MD 08/26/15 [phone]

## 2015-08-22 NOTE — H&P (Signed)
History and Physical  Paula West [id number] DOB: Jul 11, 1932 DOA: 08/22/2015   PCP: No PCP Per Patient  Outpatient Specialists:   Patient coming from: Home  Chief Complaint: mental status change  HPI:  Paula West is a 82 y.o. female with medical history of recent thalamic stroke, hypertension, diabetes mellitus, hyperlipidemia, CKD, and COPD presented with 2-3 day history of increasing lethargy and mental status change. The patient is unable to provide any significant history. All of this history is obtained from speaking with the patient's daughter and EDP.  The patient's daughter states that the patient began having some lethargy and decreased oral intake approximately 3 days prior to admission which has gradually and progressively worsened. She also had generalized weakness to the point where she had much difficulty getting out of bed. When her home health aide and physical therapist arrived on 08/22/2015, they were concerned the patient may have had a stroke because the patient had difficulty speaking and getting her words out and activated EMS.  There has not been any fevers, chills, complaints of chest pain, short of breath, vomiting, diarrhea according to the patient's daughter. The patient normally ambulates with a cane and walker.  The patient was recently discharged from the hospital after a stay from 08/01/2015 through 08/05/2015. During that hospital stay, the patient was treated medically with addition of Plavix for elevated troponin and chest pain. The patient was also recently discharged from the hospital on 07/13/2015 after treatment for an acute left thalamic infarct.  In the emergency department, the patient was in the process of receiving 1 L of normal saline. BMP showed CO2 19, serum creatinine 4.06. WBC was 11.2 with unremarkable hemoglobin and platelets. AST 53, ALT 37, alkaline phosphatase 119, total bilirubin 0.8. Ammonia was 18. CT of the brain unremarkable.  Chest x-ray was negative.  Assessment/Plan: Acute encephalopathy -Multifactorial including acute on chronic renal failure/dehydration and the patient's medications including gabapentin and tramadol -08/22/2015 CT brain negative -EKG with nonspecific T-wave changes -Keep patient on telemetry -Check urinalysis and urine culture -Ammonia 18 -Check serum B12, TSH -Neurologic exam was nonfocal--very low suspicion of stroke  Acute on chronic renal failure--CKD stage III -Baseline creatinine 1.1-1.4 -Secondary to volume depletion in setting of ARB/HCTZ use -d/c ARB/HCTZ -Continue IV fluids -Renal ultrasound -Urinalysis  HTN -reduce coreg dose to 6.25 mg bid due to soft bp -d/c ARB/HCTZ  Diabetes mellitus type 2 -hold lantus for now and monitor CBGs -NovoLog sliding scale -pt has dietary indiscretion at home--"sneaking food" which may explain necessity for significantly higher insulin dose at home -08/02/2015 hemoglobin A1c 8.4  Hyperlipidemia -ContinueLipitor 40 mg daily  COPD -Presently stable on room air  Recent left thalamic infarct -CT brain negative for acute findings -Neurologic exam nonfocal -Continue aspirin and Plavix        Past Medical History:  Diagnosis Date  . Asthma   . CKD (chronic kidney disease), stage II   . COPD (chronic obstructive pulmonary disease) ([place])   . Diabetes mellitus   . Hyperlipidemia   . Hypertension   Surgical History reviewed. No pertinent surgical history. Social History:  reports that she has quit smoking. She has quit using smokeless tobacco. She reports that she does not drink alcohol or use drugs.   Family History  Problem Relation Age of Onset  . Diabetes Sister   . Stroke Mother   . CAD Neg Hx      Allergies  Allergen Reactions  . Codeine Rash  .  Darvon [Propoxyphene] Rash  . Penicillins Rash    Has patient had a PCN reaction causing immediate rash, facial/tongue/throat swelling, SOB or lightheadedness  with hypotension: Yes Has patient had a PCN reaction causing severe rash involving mucus membranes or skin necrosis: No Has patient had a PCN reaction that required hospitalization No Has patient had a PCN reaction occurring within the last 10 years: No If all of the above answers are "NO", then may proceed with Cephalosporin use.      Prior to Admission medications   Medication Sig Start Date End Date Taking? Authorizing Provider  ACCU-CHEK AVIVA PLUS test strip As directed 07/02/15  Yes Historical Provider, MD  ACCU-CHEK SOFTCLIX LANCETS lancets As directed 07/02/15  Yes Historical Provider, MD  albuterol (PROAIR HFA) 108 (90 Base) MCG/ACT inhaler Inhale 2 puffs into the lungs every 6 (six) hours as needed for wheezing or shortness of breath.   Yes Historical Provider, MD  aspirin 81 MG tablet Take 1 tablet (81 mg total) by mouth daily. 07/12/15  Yes [name], MD  atorvastatin (LIPITOR) 40 MG tablet Take 1 tablet (40 mg total) by mouth daily at 6 PM. 07/13/15  Yes [name], MD  carvedilol (COREG) 12.5 MG tablet Take 1 tablet (12.5 mg total) by mouth 2 (two) times daily with a meal. 08/05/15  Yes [name], MD  clopidogrel (PLAVIX) 75 MG tablet Take 1 tablet (75 mg total) by mouth daily. 08/05/15  Yes [name], MD  colchicine (COLCRYS) 0.6 MG tablet Take 0.6 mg by mouth 2 (two) times daily as needed (for gout symptoms).   Yes Historical Provider, MD  gabapentin (NEURONTIN) 300 MG capsule Take 300 mg by mouth 3 (three) times daily.    Yes Historical Provider, MD  insulin glargine (LANTUS) 100 UNIT/ML injection Inject 50-60 Units into the skin 2 (two) times daily. 60 units in the morning and 50 units at bedtime depending on BGL   Yes Historical Provider, MD  linagliptin (TRADJENTA) 5 MG TABS tablet Take 5 mg by mouth daily.   Yes Historical Provider, MD  olmesartan-hydrochlorothiazide (BENICAR HCT) 40-25 MG per tablet Take 1 tablet by mouth daily.     Yes Historical Provider, MD    omeprazole (PRILOSEC) 20 MG capsule Take 2 capsules (40 mg total) by mouth 2 (two) times daily before a meal. Patient taking differently: Take 20 mg by mouth 2 (two) times daily before a meal.  08/05/15  Yes [name], MD  prednisoLONE acetate (PRED FORTE) 1 % ophthalmic suspension Place 1 drop into both eyes 4 (four) times daily. 08/12/15  Yes Historical Provider, MD  ranitidine (ZANTAC) 300 MG tablet Take 1 tablet (300 mg total) by mouth at bedtime. 08/05/15  Yes [name], MD  sucralfate (CARAFATE) 1 GM/10ML suspension Take 10 mLs (1 g total) by mouth 3 (three) times daily. 08/05/15  Yes [name], MD  traMADol (ULTRAM) 50 MG tablet Take 50 mg by mouth 2 (two) times daily as needed for moderate pain.   Yes Historical Provider, MD  trimethoprim-polymyxin b (POLYTRIM) ophthalmic solution INSTILL 1 DROP IN LEFT EYE 4 TIMES DAILY FOR 2 DAYS AFTER EACH MONTHLY EYE INJECTION 06/23/15  Yes Historical Provider, MD    Review of Systems:  Unobtainable secondary to patient's acute encephalopathy  Physical Exam: Vitals:   08/22/15 1530 08/22/15 1545 08/22/15 1600 08/22/15 1738  BP: 119/82 122/89 140/86 111/66  Pulse: 73 76 74 68  Resp: 15 18 13 22   Temp:  SpO2: 96% 95% 95% 96%   General:  A&O x1, NAD, nontoxic, pleasant/cooperative Head/Eye: No conjunctival hemorrhage, no icterus, /AT, No nystagmus; no meningismus ENT:  No icterus,  No thrush, good dentition, no pharyngeal exudate Neck:  No masses, no lymphadenpathy, no bruits CV:  RRR, no rub, no gallop, no S3 Lung:  Bibasilar crackles without wheezing. Good air movement Abdomen: soft/NT, +BS, nondistended, no peritoneal signs Ext: No cyanosis, No rashes, No petechiae, No lymphangitis, No edema Neuro: CNII-XII intact, strength 4/5 in bilateral upper and lower extremities, no dysmetria  Labs on Admission:  Basic Metabolic Panel:  Recent Labs Lab 08/22/15 1625 08/22/15 1628  NA 136 138  K 4.0 4.0  CL 103 105  CO2 19*  --    GLUCOSE 122* 124*  BUN 93* 88*  CREATININE 4.06* 4.20*  CALCIUM 9.8  --    Liver Function Tests:  Recent Labs Lab 08/22/15 1625  AST 53*  ALT 37  ALKPHOS 118  BILITOT 0.8  PROT 7.4  ALBUMIN 3.6    Recent Labs Lab 08/22/15 1625  LIPASE 21    Recent Labs Lab 08/22/15 1625  AMMONIA 18   CBC:  Recent Labs Lab 08/22/15 1625 08/22/15 1628  WBC 11.2*  --   NEUTROABS 8.3*  --   HGB 14.7 15.6*  HCT 43.3 46.0  MCV 79.6  --   PLT 198  --    Coagulation Profile: No results for input(s): INR, PROTIME in the last 168 hours. Cardiac Enzymes: No results for input(s): CKTOTAL, CKMB, CKMBINDEX, TROPONINI in the last 168 hours. BNP: Invalid input(s): POCBNP CBG: No results for input(s): GLUCAP in the last 168 hours. Urine analysis:    Component Value Date/Time   COLORURINE YELLOW 07/12/2015 0122   APPEARANCEUR CLEAR 07/12/2015 0122   LABSPEC 1.010 07/12/2015 0122   PHURINE 6.5 07/12/2015 0122   GLUCOSEU NEGATIVE 07/12/2015 0122   HGBUR NEGATIVE 07/12/2015 0122   BILIRUBINUR NEGATIVE 07/12/2015 0122   KETONESUR NEGATIVE 07/12/2015 0122   PROTEINUR 30 (A) 07/12/2015 0122   UROBILINOGEN 0.2 06/22/2010 1226   NITRITE NEGATIVE 07/12/2015 0122   LEUKOCYTESUR NEGATIVE 07/12/2015 0122   Sepsis Labs: @LABRCNTIP (procalcitonin:4,lacticidven:4) )No results found for this or any previous visit (from the past 240 hour(s)).   Radiological Exams on Admission: Dg Chest 2 View  Result Date: 08/22/2015 CLINICAL DATA:  Altered mental status EXAM: CHEST  2 VIEW COMPARISON:  08/01/2015 FINDINGS: Cardiac shadow is not enlarged and accentuated by AP technique. No infiltrate or sizable effusion is seen. No acute bony abnormality is noted. Degenerative changes of the thoracic spine are seen. IMPRESSION: No active cardiopulmonary disease. Electronically Signed   By: Inez Catalina M.D.   On: 08/22/2015 15:14   Ct Head Wo Contrast  Result Date: 08/22/2015 CLINICAL DATA:  82 year old  brought in for altered mental status and unresponsiveness. EXAM: CT HEAD WITHOUT CONTRAST TECHNIQUE: Contiguous axial images were obtained from the base of the skull through the vertex without intravenous contrast. COMPARISON:  07/11/2015 FINDINGS: There is moderate chronic ventriculomegaly which appears similar to previous exam. As described previously this remains outer for portion to the degree of sulcal enlargement. Patchy areas of low attenuation throughout the subcortical and periventricular white matter are identified compatible with chronic microvascular disease. Chronic left basal ganglia lacunar infarct is noted. No evidence for acute brain infarct. No intracranial hemorrhage or mass identified. The paranasal sinuses and mastoid air cells appear clear. The calvarium is intact. IMPRESSION: 1. No acute intracranial abnormalities. 2. Chronic,  moderate ventriculomegaly 3. Chronic small vessel ischemic change. Electronically Signed   By: Kerby Moors M.D.   On: 08/22/2015 15:10    EKG: Independently reviewed. Sinus, nonspecific T waves change    Time spent:60 minutes Code Status:   DNR Family Communication:  Daughter updated on phone Disposition Plan: expect 2-3 day hospitalization Consults called: none DVT Prophylaxis:  Lovenox/Heparin   SCDs  Lyra Alaimo, DO  [facility] Pager [phone]  If 7PM-7AM, please contact night-coverage [email or web address] Password [email or web address] 08/22/2015, 6:18 PM

## 2015-08-23 ENCOUNTER — Inpatient Hospital Stay (HOSPITAL_COMMUNITY): Payer: Medicare Other

## 2015-08-23 LAB — GLUCOSE, CAPILLARY
GLUCOSE-CAPILLARY: 124 mg/dL — AB (ref 65–99)
GLUCOSE-CAPILLARY: 159 mg/dL — AB (ref 65–99)
Glucose-Capillary: 151 mg/dL — ABNORMAL HIGH (ref 65–99)
Glucose-Capillary: 176 mg/dL — ABNORMAL HIGH (ref 65–99)

## 2015-08-23 LAB — COMPREHENSIVE METABOLIC PANEL
ALK PHOS: 118 U/L (ref 38–126)
ALT: 36 U/L (ref 14–54)
ANION GAP: 14 (ref 5–15)
AST: 47 U/L — ABNORMAL HIGH (ref 15–41)
Albumin: 3.5 g/dL (ref 3.5–5.0)
BUN: 83 mg/dL — ABNORMAL HIGH (ref 6–20)
CALCIUM: 9.6 mg/dL (ref 8.9–10.3)
CHLORIDE: 107 mmol/L (ref 101–111)
CO2: 18 mmol/L — AB (ref 22–32)
Creatinine, Ser: 3.13 mg/dL — ABNORMAL HIGH (ref 0.44–1.00)
GFR calc non Af Amer: 13 mL/min — ABNORMAL LOW (ref >60)
GFR, EST AFRICAN AMERICAN: 15 mL/min — AB (ref >60)
Glucose, Bld: 139 mg/dL — ABNORMAL HIGH (ref 65–99)
Potassium: 3.9 mmol/L (ref 3.5–5.1)
SODIUM: 139 mmol/L (ref 135–145)
Total Bilirubin: 0.6 mg/dL (ref 0.3–1.2)
Total Protein: 6.9 g/dL (ref 6.5–8.1)

## 2015-08-23 LAB — CBC
HCT: 43.8 % (ref 36.0–46.0)
HEMOGLOBIN: 14.5 g/dL (ref 12.0–15.0)
MCH: 26.1 pg (ref 26.0–34.0)
MCHC: 33.1 g/dL (ref 30.0–36.0)
MCV: 78.9 fL (ref 78.0–100.0)
Platelets: 195 10*3/uL (ref 150–400)
RBC: 5.55 MIL/uL — AB (ref 3.87–5.11)
RDW: 16.1 % — ABNORMAL HIGH (ref 11.5–15.5)
WBC: 9.1 10*3/uL (ref 4.0–10.5)

## 2015-08-23 LAB — TSH: TSH: 4.831 u[IU]/mL — AB (ref 0.350–4.500)

## 2015-08-23 LAB — VITAMIN B12: Vitamin B-12: 419 pg/mL (ref 180–914)

## 2015-08-23 NOTE — Evaluation (Addendum)
Clinical/Bedside Swallow Evaluation Patient Details  Name: Paula West MRN: [id number] Date of Birth: [birth year]  Today's Date: 08/23/2015 Time: SLP Start Time (ACUTE ONLY): 1310 SLP Stop Time (ACUTE ONLY): 1331 SLP Time Calculation (min) (ACUTE ONLY): 21 min  Past Medical History:  Past Medical History:  Diagnosis Date  . Asthma   . CKD (chronic kidney disease), stage II   . COPD (chronic obstructive pulmonary disease) ([place])   . Diabetes mellitus   . Hyperlipidemia   . Hypertension    Past Surgical History: History reviewed. No pertinent surgical history. HPI:  82 y.o. female with medical history of recent thalamic stroke, hypertension, diabetes mellitus, hyperlipidemia, CKD, and COPD presented with 2-3 day history of increasing lethargy and mental status change. The patient is unable to provide any significant history. All of this history is obtained from speaking with the patient's daughter and EDP.  The patient's daughter states that the patient began having some lethargy and decreased oral intake approximately 3 days prior to admission which has gradually and progressively worsened. She also had generalized weakness to the point where she had much difficulty getting out of bed. When her home health aide and physical therapist arrived on 08/22/2015, they were concerned the patient may have had a stroke because the patient had difficulty speaking and getting her words out and activated EMS.  There has not been any fevers, chills, complaints of chest pain, short of breath, vomiting, diarrhea according to the patient's daughter. The patient normally ambulates with a cane and walker.  The patient was recently discharged from the hospital after a stay from 08/01/2015 through 08/05/2015. During that hospital stay, the patient was treated medically with addition of Plavix for elevated troponin and chest pain. The patient was also recently discharged from the hospital on 07/13/2015 after treatment  for an acute left thalamic infarct; BSE initiated d/t pt having difficulty with breakfast tray this a.m. (i.e.: coughing with liquids) per nursing.    Assessment / Plan / Recommendation Clinical Impression   Pt with oropharyngeal dysphagia characterized by oral holding, delayed oral transit, impaired mastication with solids (eventual oral expulsion with meats) and suspected delay in the initiation of the swallow probably d/t cognitive impairment from recent CVA.  Pt needed moderate verbal/visual cueing to swallow boluses and was impulsive with straw sips resulting in delayed cough; without straw, pt was able to swallow thin liquids without overt s/s of aspiration noted; pt exhibited overall delay in processing information and following >2 step commands consistently.  SLE may be warranted while pt is in house; Recommend Dysphagia 2 diet (chopped)/thin with small sips, small bites, slow rate, no straws and decreasing distractions within the environment.  ST will f/u for diet tolerance/aspiration/swallowing precaution education.    Aspiration Risk  Mild aspiration risk    Diet Recommendation   Dysphagia 2 (chopped)/thin with precautions  Medication Administration: Whole meds with puree    Other  Recommendations Oral Care Recommendations: Oral care BID   Follow up Recommendations  Skilled Nursing facility    Frequency and Duration min 2x/week  1 week       Prognosis Prognosis for Safe Diet Advancement: Good Barriers to Reach Goals: Cognitive deficits      Swallow Study   General Date of Onset: 08/22/15 HPI: 82 y.o. female with medical history of recent thalamic stroke, hypertension, diabetes mellitus, hyperlipidemia, CKD, and COPD presented with 2-3 day history of increasing lethargy and mental status change. The patient is unable to provide any significant history.  All of this history is obtained from speaking with the patient's daughter and EDP.  The patient's daughter states that the  patient began having some lethargy and decreased oral intake approximately 3 days prior to admission which has gradually and progressively worsened. She also had generalized weakness to the point where she had much difficulty getting out of bed. When her home health aide and physical therapist arrived on 08/22/2015, they were concerned the patient may have had a stroke because the patient had difficulty speaking and getting her words out and activated EMS.  There has not been any fevers, chills, complaints of chest pain, short of breath, vomiting, diarrhea according to the patient's daughter. The patient normally ambulates with a cane and walker.  The patient was recently discharged from the hospital after a stay from 08/01/2015 through 08/05/2015. During that hospital stay, the patient was treated medically with addition of Plavix for elevated troponin and chest pain. The patient was also recently discharged from the hospital on 07/13/2015 after treatment for an acute left thalamic infarct. Type of Study: Bedside Swallow Evaluation Diet Prior to this Study: Regular;Thin liquids Temperature Spikes Noted: No Respiratory Status: Room air History of Recent Intubation: No Behavior/Cognition: Alert;Confused;Distractible Oral Cavity Assessment: Within Functional Limits Oral Care Completed by SLP: No Oral Cavity - Dentition: Poor condition;Missing dentition Vision: Functional for self-feeding Self-Feeding Abilities: Able to feed self;Needs assist Patient Positioning: Upright in bed Baseline Vocal Quality: Normal Volitional Cough: Cognitively unable to elicit Volitional Swallow: Able to elicit    Oral/Motor/Sensory Function Overall Oral Motor/Sensory Function: Other (comment) (difficult to assess d/t cognitive issues)   Ice Chips Ice chips: Not tested   Thin Liquid Thin Liquid: Impaired Presentation: Cup;Straw Oral Phase Functional Implications: Prolonged oral transit;Oral holding Pharyngeal  Phase  Impairments: Suspected delayed Swallow    Nectar Thick Nectar Thick Liquid: Not tested   Honey Thick Honey Thick Liquid: Not tested   Puree Puree: Impaired Presentation: Spoon Oral Phase Functional Implications: Oral holding Pharyngeal Phase Impairments: Suspected delayed Swallow   Solid      Solid: Impaired Presentation: Spoon Oral Phase Impairments: Impaired mastication Oral Phase Functional Implications: Prolonged oral transit;Impaired mastication;Oral holding Pharyngeal Phase Impairments: Suspected delayed Swallow    Functional Assessment Tool Used: NOMS Functional Limitations: Swallowing Swallow Current Status [id number]): At least 20 percent but less than 40 percent impaired, limited or restricted Swallow Goal Status [phone]): At least 1 percent but less than 20 percent impaired, limited or restricted   Leonell Lobdell,PAT, M.S., CCC-SLP 08/23/2015,1:53 PM

## 2015-08-23 NOTE — Progress Notes (Signed)
PROGRESS NOTE    Paula West  [id number] DOB: 1932/02/16 DOA: 08/22/2015 PCP: No PCP Per Patient   Brief Narrative:  82 year old who presented with altered mental status presumably secondary to dehydration and worsening kidney function.  Assessment & Plan:   Active Problems: AKI (acute kidney injury) ([place]) - Secondary to prerenal etiology and possible potentiation by nephrotoxic agents currently being held. - Currently improving on current regimen will continue.  Acute encephalopathy - Most likely secondary to dehydration and AKI - Continue to monitor on a daily basis    Hyperlipidemia - stable on lipitor, no chest pain reported    Type 2 diabetes mellitus with hyperglycemia, with long-term current use of insulin (HCC) - Currently on SSI    Chronic obstructive pulmonary disease ([place]) - stable no wheezes currently. Albuterol as needed.    DVT prophylaxis:Heparin Code Status: DNR Family Communication: None at bedside. Disposition Plan: Pending improvement in condition   Consultants:   None   Procedures: None   Antimicrobials: None  Subjective: Pt per family member is still confused.   Objective: Vitals:   08/22/15 2100 08/22/15 2144 08/23/15 0300 08/23/15 1411  BP: 124/70 122/72 136/65 140/66  Pulse: 65 69 (!) 59 (!) 57  Resp: 15 16 18 17   Temp:  98.5 F (36.9 C) 98.2 F (36.8 C) 98.1 F (36.7 C)  TempSrc:  Axillary Oral Oral  SpO2: 97% 100% 99% 99%    Intake/Output Summary (Last 24 hours) at 08/23/15 1603 Last data filed at 08/23/15 1412  Gross per 24 hour  Intake               60 ml  Output                0 ml  Net               60 ml   There were no vitals filed for this visit.  Examination:  General exam: Appears calm and comfortable  Head: atraumatic, normocephalic Respiratory system: Clear to auscultation. Respiratory effort normal. Cardiovascular system: S1 & S2 heard, RRR. No JVD, murmurs, rubs, gallops or clicks. No pedal  edema. Gastrointestinal system: Abdomen is nondistended, soft and nontender. No organomegaly or masses felt. Normal bowel sounds heard. Central nervous system: awake and alert, no facial asymmetry. Limited exam due to limited patient cooperation Skin: No rashes, lesions or ulcers on limited exam. Psychiatry: Unable to accurately assess due to confusion and limited patient cooperation  Data Reviewed: I have personally reviewed following labs and imaging studies  CBC:  Recent Labs Lab 08/22/15 1625 08/22/15 1628 08/23/15 0311  WBC 11.2*  --  9.1  NEUTROABS 8.3*  --   --   HGB 14.7 15.6* 14.5  HCT 43.3 46.0 43.8  MCV 79.6  --  78.9  PLT 198  --  0000000   Basic Metabolic Panel:  Recent Labs Lab 08/22/15 1625 08/22/15 1628 08/23/15 0311  NA 136 138 139  K 4.0 4.0 3.9  CL 103 105 107  CO2 19*  --  18*  GLUCOSE 122* 124* 139*  BUN 93* 88* 83*  CREATININE 4.06* 4.20* 3.13*  CALCIUM 9.8  --  9.6   GFR: CrCl cannot be calculated (Unknown ideal weight.). Liver Function Tests:  Recent Labs Lab 08/22/15 1625 08/23/15 0311  AST 53* 47*  ALT 37 36  ALKPHOS 118 118  BILITOT 0.8 0.6  PROT 7.4 6.9  ALBUMIN 3.6 3.5    Recent Labs Lab 08/22/15 1625  LIPASE 21    Recent Labs Lab 08/22/15 1625  AMMONIA 18   Coagulation Profile: No results for input(s): INR, PROTIME in the last 168 hours. Cardiac Enzymes: No results for input(s): CKTOTAL, CKMB, CKMBINDEX, TROPONINI in the last 168 hours. BNP (last 3 results) No results for input(s): PROBNP in the last 8760 hours. HbA1C: No results for input(s): HGBA1C in the last 72 hours. CBG:  Recent Labs Lab 08/22/15 2140 08/23/15 0643 08/23/15 1124  GLUCAP 154* 124* 159*   Lipid Profile: No results for input(s): CHOL, HDL, LDLCALC, TRIG, CHOLHDL, LDLDIRECT in the last 72 hours. Thyroid Function Tests:  Recent Labs  08/22/15 2254  TSH 4.831*   Anemia Panel:  Recent Labs  08/22/15 2254  VITAMINB12 419   Sepsis  Labs: No results for input(s): PROCALCITON, LATICACIDVEN in the last 168 hours.  No results found for this or any previous visit (from the past 240 hour(s)).       Radiology Studies: Dg Chest 2 View  Result Date: 08/22/2015 CLINICAL DATA:  Altered mental status EXAM: CHEST  2 VIEW COMPARISON:  08/01/2015 FINDINGS: Cardiac shadow is not enlarged and accentuated by AP technique. No infiltrate or sizable effusion is seen. No acute bony abnormality is noted. Degenerative changes of the thoracic spine are seen. IMPRESSION: No active cardiopulmonary disease. Electronically Signed   By: Inez Catalina M.D.   On: 08/22/2015 15:14   Ct Head Wo Contrast  Result Date: 08/22/2015 CLINICAL DATA:  82 year old brought in for altered mental status and unresponsiveness. EXAM: CT HEAD WITHOUT CONTRAST TECHNIQUE: Contiguous axial images were obtained from the base of the skull through the vertex without intravenous contrast. COMPARISON:  07/11/2015 FINDINGS: There is moderate chronic ventriculomegaly which appears similar to previous exam. As described previously this remains outer for portion to the degree of sulcal enlargement. Patchy areas of low attenuation throughout the subcortical and periventricular white matter are identified compatible with chronic microvascular disease. Chronic left basal ganglia lacunar infarct is noted. No evidence for acute brain infarct. No intracranial hemorrhage or mass identified. The paranasal sinuses and mastoid air cells appear clear. The calvarium is intact. IMPRESSION: 1. No acute intracranial abnormalities. 2. Chronic, moderate ventriculomegaly 3. Chronic small vessel ischemic change. Electronically Signed   By: Kerby Moors M.D.   On: 08/22/2015 15:10   US Renal  Result Date: 08/23/2015 CLINICAL DATA:  82 year old female with a history of acute renal failure EXAM: RENAL / URINARY TRACT ULTRASOUND COMPLETE COMPARISON:  CT 08/02/2015 FINDINGS: Right Kidney: Length: 9.4 cm.  Echogenicity similar to that of the adjacent liver. No hydronephrosis. No large echogenic focus of the right kidney. Left Kidney: Length: 9.7 cm. Echogenicity of the left kidney similar to that of the adjacent spleen parenchyma and of the contralateral kidney. No hydronephrosis. No large echogenic focus. Bladder: Appears normal for degree of bladder distention. IMPRESSION: No evidence of hydronephrosis. Signed, Dulcy Fanny. Earleen Newport, DO [facility] Electronically Signed   By: Corrie Mckusick D.O.   On: 08/23/2015 10:58        Scheduled Meds: . aspirin  81 mg Oral QPM  . atorvastatin  40 mg Oral q1800  . carvedilol  6.25 mg Oral BID WC  . clopidogrel  75 mg Oral Daily  . heparin subcutaneous  5,000 Units Subcutaneous Q8H  . insulin aspart  0-5 Units Subcutaneous QHS  . insulin aspart  0-9 Units Subcutaneous TID WC  . pantoprazole  40 mg Oral Daily  . prednisoLONE  acetate  1 drop Both Eyes QID  . sucralfate  1 g Oral TID   Continuous Infusions: . sodium chloride 75 mL/hr at 08/22/15 2338     LOS: 1 day    Time spent: > 35 minutes  Velvet Bathe, MD [facility] Pager [phone]  If 7PM-7AM, please contact night-coverage [email or web address] Password [email or web address] 08/23/2015, 4:03 PM

## 2015-08-24 LAB — GLUCOSE, CAPILLARY
GLUCOSE-CAPILLARY: 130 mg/dL — AB (ref 65–99)
GLUCOSE-CAPILLARY: 102 mg/dL — AB (ref 65–99)
Glucose-Capillary: 113 mg/dL — ABNORMAL HIGH (ref 65–99)

## 2015-08-24 LAB — CBC
HEMATOCRIT: 40.3 % (ref 36.0–46.0)
HEMOGLOBIN: 13.4 g/dL (ref 12.0–15.0)
MCH: 26 pg (ref 26.0–34.0)
MCHC: 33.3 g/dL (ref 30.0–36.0)
MCV: 78.3 fL (ref 78.0–100.0)
Platelets: 200 10*3/uL (ref 150–400)
RBC: 5.15 MIL/uL — AB (ref 3.87–5.11)
RDW: 15.9 % — ABNORMAL HIGH (ref 11.5–15.5)
WBC: 8.6 10*3/uL (ref 4.0–10.5)

## 2015-08-24 LAB — BASIC METABOLIC PANEL
ANION GAP: 10 (ref 5–15)
BUN: 41 mg/dL — ABNORMAL HIGH (ref 6–20)
CHLORIDE: 110 mmol/L (ref 101–111)
CO2: 20 mmol/L — AB (ref 22–32)
CREATININE: 1.57 mg/dL — AB (ref 0.44–1.00)
Calcium: 9.3 mg/dL (ref 8.9–10.3)
GFR calc non Af Amer: 30 mL/min — ABNORMAL LOW (ref >60)
GFR, EST AFRICAN AMERICAN: 35 mL/min — AB (ref >60)
Glucose, Bld: 139 mg/dL — ABNORMAL HIGH (ref 65–99)
POTASSIUM: 3.5 mmol/L (ref 3.5–5.1)
SODIUM: 140 mmol/L (ref 135–145)

## 2015-08-24 NOTE — Progress Notes (Signed)
PROGRESS NOTE    Paula West  [id number] DOB: 09-17-1932 DOA: 08/22/2015 PCP: No PCP Per Patient   Brief Narrative:  82 year old who presented with altered mental status presumably secondary to dehydration and worsening kidney function.  Assessment & Plan:   Active Problems: AKI (acute kidney injury) ([place]) - Secondary to prerenal etiology and possible potentiation by nephrotoxic agents currently being held. - Currently improving on current regimen will continue. - S creatinine trending down from 3 to 1.5  Acute encephalopathy - Most likely secondary to dehydration and AKI - More alert today but still confused    Hyperlipidemia - stable on lipitor, no chest pain reported    Type 2 diabetes mellitus with hyperglycemia, with long-term current use of insulin (HCC) - Currently on SSI    Chronic obstructive pulmonary disease ([facility]CC) - stable no wheezes currently. Albuterol as needed.    DVT prophylaxis:Heparin Code Status: DNR Family Communication: None at bedside. Disposition Plan: Pending improvement of mentation back to baseline.   Consultants:   None   Procedures: None   Antimicrobials: None  Subjective: Pt has no new complaints  Objective: Vitals:   08/23/15 1411 08/23/15 2019 08/24/15 0536 08/24/15 0828  BP: 140/66 (!) 148/65 (!) 149/71 (!) 173/80  Pulse: (!) 57 60 (!) 55 (!) 55  Resp: 17 20 20 18   Temp: 98.1 F (36.7 C) 98.9 F (37.2 C) 98.1 F (36.7 C) 98.6 F (37 C)  TempSrc: Oral Oral Oral Oral  SpO2: 99% 97% 99% 100%    Intake/Output Summary (Last 24 hours) at 08/24/15 1546 Last data filed at 08/23/15 1800  Gross per 24 hour  Intake              120 ml  Output                0 ml  Net              120 ml   There were no vitals filed for this visit.  Examination:  General exam: Appears calm and comfortable  Head: atraumatic, normocephalic Respiratory system: Clear to auscultation. Respiratory effort normal. Cardiovascular system:  S1 & S2 heard, RRR. No JVD, murmurs, rubs, gallops or clicks. No pedal edema. Gastrointestinal system: Abdomen is nondistended, soft and nontender. No organomegaly or masses felt. Normal bowel sounds heard. Central nervous system: awake and alert, no facial asymmetry. Limited exam due to limited patient cooperation Skin: No rashes, lesions or ulcers on limited exam. Psychiatry: Unable to accurately assess due to confusion and limited patient cooperation  Data Reviewed: I have personally reviewed following labs and imaging studies  CBC:  Recent Labs Lab 08/22/15 1625 08/22/15 1628 08/23/15 0311 08/24/15 0641  WBC 11.2*  --  9.1 8.6  NEUTROABS 8.3*  --   --   --   HGB 14.7 15.6* 14.5 13.4  HCT 43.3 46.0 43.8 40.3  MCV 79.6  --  78.9 78.3  PLT 198  --  195 [id number]   Basic Metabolic Panel:  Recent Labs Lab 08/22/15 1625 08/22/15 1628 08/23/15 0311 08/24/15 0641  NA 136 138 139 140  K 4.0 4.0 3.9 3.5  CL 103 105 107 110  CO2 19*  --  18* 20*  GLUCOSE 122* 124* 139* 139*  BUN 93* 88* 83* 41*  CREATININE 4.06* 4.20* 3.13* 1.57*  CALCIUM 9.8  --  9.6 9.3   GFR: CrCl cannot be calculated (Unknown ideal weight.). Liver Function Tests:  Recent Labs Lab 08/22/15 1625 08/23/15 [id number]  AST 53* 47*  ALT 37 36  ALKPHOS 118 118  BILITOT 0.8 0.6  PROT 7.4 6.9  ALBUMIN 3.6 3.5    Recent Labs Lab 08/22/15 1625  LIPASE 21    Recent Labs Lab 08/22/15 1625  AMMONIA 18   Coagulation Profile: No results for input(s): INR, PROTIME in the last 168 hours. Cardiac Enzymes: No results for input(s): CKTOTAL, CKMB, CKMBINDEX, TROPONINI in the last 168 hours. BNP (last 3 results) No results for input(s): PROBNP in the last 8760 hours. HbA1C: No results for input(s): HGBA1C in the last 72 hours. CBG:  Recent Labs Lab 08/23/15 0643 08/23/15 1124 08/23/15 1619 08/23/15 2256 08/24/15 0602  GLUCAP 124* 159* 151* 176* 130*   Lipid Profile: No results for input(s): CHOL, HDL,  LDLCALC, TRIG, CHOLHDL, LDLDIRECT in the last 72 hours. Thyroid Function Tests:  Recent Labs  08/22/15 2254  TSH 4.831*   Anemia Panel:  Recent Labs  08/22/15 2254  VITAMINB12 419   Sepsis Labs: No results for input(s): PROCALCITON, LATICACIDVEN in the last 168 hours.  No results found for this or any previous visit (from the past 240 hour(s)).       Radiology Studies: US Renal  Result Date: 08/23/2015 CLINICAL DATA:  82 year old female with a history of acute renal failure EXAM: RENAL / URINARY TRACT ULTRASOUND COMPLETE COMPARISON:  CT 08/02/2015 FINDINGS: Right Kidney: Length: 9.4 cm. Echogenicity similar to that of the adjacent liver. No hydronephrosis. No large echogenic focus of the right kidney. Left Kidney: Length: 9.7 cm. Echogenicity of the left kidney similar to that of the adjacent spleen parenchyma and of the contralateral kidney. No hydronephrosis. No large echogenic focus. Bladder: Appears normal for degree of bladder distention. IMPRESSION: No evidence of hydronephrosis. Signed, Dulcy Fanny. Earleen Newport, DO [facility] Electronically Signed   By: Corrie Mckusick D.O.   On: 08/23/2015 10:58        Scheduled Meds: . aspirin  81 mg Oral QPM  . atorvastatin  40 mg Oral q1800  . carvedilol  6.25 mg Oral BID WC  . clopidogrel  75 mg Oral Daily  . heparin subcutaneous  5,000 Units Subcutaneous Q8H  . insulin aspart  0-5 Units Subcutaneous QHS  . insulin aspart  0-9 Units Subcutaneous TID WC  . pantoprazole  40 mg Oral Daily  . prednisoLONE acetate  1 drop Both Eyes QID  . sucralfate  1 g Oral TID   Continuous Infusions: . sodium chloride 75 mL/hr at 08/23/15 2312     LOS: 2 days    Time spent: > 35 minutes  Velvet Bathe, MD [facility] Pager [phone]  If 7PM-7AM, please contact night-coverage [email or web address] Password [name] 08/24/2015, 3:46 PM

## 2015-08-25 ENCOUNTER — Other Ambulatory Visit: Payer: Self-pay | Admitting: Internal Medicine

## 2015-08-25 DIAGNOSIS — N183 Chronic kidney disease, stage 3 (moderate): Secondary | ICD-10-CM

## 2015-08-25 DIAGNOSIS — Z794 Long term (current) use of insulin: Secondary | ICD-10-CM

## 2015-08-25 DIAGNOSIS — N179 Acute kidney failure, unspecified: Principal | ICD-10-CM

## 2015-08-25 DIAGNOSIS — R410 Disorientation, unspecified: Secondary | ICD-10-CM

## 2015-08-25 DIAGNOSIS — E1165 Type 2 diabetes mellitus with hyperglycemia: Secondary | ICD-10-CM

## 2015-08-25 LAB — GLUCOSE, CAPILLARY
GLUCOSE-CAPILLARY: 190 mg/dL — AB (ref 65–99)
Glucose-Capillary: 117 mg/dL — ABNORMAL HIGH (ref 65–99)

## 2015-08-25 LAB — BASIC METABOLIC PANEL
Anion gap: 8 (ref 5–15)
BUN: 30 mg/dL — AB (ref 6–20)
CALCIUM: 9.2 mg/dL (ref 8.9–10.3)
CO2: 20 mmol/L — ABNORMAL LOW (ref 22–32)
Chloride: 108 mmol/L (ref 101–111)
Creatinine, Ser: 1.34 mg/dL — ABNORMAL HIGH (ref 0.44–1.00)
GFR calc Af Amer: 42 mL/min — ABNORMAL LOW (ref >60)
GFR, EST NON AFRICAN AMERICAN: 36 mL/min — AB (ref >60)
GLUCOSE: 107 mg/dL — AB (ref 65–99)
POTASSIUM: 4.4 mmol/L (ref 3.5–5.1)
SODIUM: 136 mmol/L (ref 135–145)

## 2015-08-25 LAB — FOLATE RBC
FOLATE, RBC: 874 ng/mL (ref >498)
Folate, Hemolysate: 365.2 ng/mL
HEMATOCRIT: 41.8 % (ref 34.0–46.6)

## 2015-08-25 NOTE — Discharge Summary (Addendum)
Paula West, is a 82 y.o. female  DOB 1932-02-14  MRN [id number].  Admission date:  08/22/2015  Admitting Physician  [name], MD  Discharge Date:  08/25/2015   Primary MD  No PCP Per Patient  Recommendations for primary care physician for things to follow:   ARF S/p hydration with ns Pt restarted on benicar /hydrochlorothiazide, please recheck bmp in 1-2 weeks.   AMS secondary to dehydration and ARF  Dm2 Cont current medications.   Abnormal lft ? Secondary to lipitor,  Please check cmp in 1-2 weeks.  Consider further w/up if abnormal, thanks  3cm nodule in the right adrenal gland, please f/u  w ? MRI once kidney function documented as stable, thanks   Admission Diagnosis  Delirium [R41.0] AKI (acute kidney injury) ([place]) [N17.9]   Discharge Diagnosis  Delirium [R41.0] AKI (acute kidney injury) ([place]) [N17.9]    Active Problems:   Hyperlipidemia   Type 2 diabetes mellitus with hyperglycemia, with long-term current use of insulin ([facility])   Chronic obstructive pulmonary disease ([place])   AKI (acute kidney injury) ([place])   Acute renal failure superimposed on stage 3 chronic kidney disease ([place])   Acute encephalopathy      Past Medical History:  Diagnosis Date  . Asthma   . CKD (chronic kidney disease), stage II   . COPD (chronic obstructive pulmonary disease) ([place])   . Diabetes mellitus   . Hyperlipidemia   . Hypertension     History reviewed. No pertinent surgical history.     HPI  from the history and physical done on the day of admission:     82 y.o. female with medical history of recent thalamic stroke, hypertension, diabetes mellitus, hyperlipidemia, CKD, and COPD presented with 2-3 day history of increasing lethargy and mental status change. The patient is unable to provide any significant history. All of this history is obtained from speaking with the patient's daughter  and EDP.  The patient's daughter states that the patient began having some lethargy and decreased oral intake approximately 3 days prior to admission which has gradually and progressively worsened. She also had generalized weakness to the point where she had much difficulty getting out of bed. When her home health aide and physical therapist arrived on 08/22/2015, they were concerned the patient may have had a stroke because the patient had difficulty speaking and getting her words out and activated EMS.  There has not been any fevers, chills, complaints of chest pain, short of breath, vomiting, diarrhea according to the patient's daughter. The patient normally ambulates with a cane and walker.  The patient was recently discharged from the hospital after a stay from 08/01/2015 through 08/05/2015. During that hospital stay, the patient was treated medically with addition of Plavix for elevated troponin and chest pain. The patient was also recently discharged from the hospital on 07/13/2015 after treatment for an acute left thalamic infarct.  In the emergency department, the patient was in the process of receiving 1 L of normal saline.  BMP showed CO2 19, serum creatinine 4.06. WBC was 11.2 with unremarkable hemoglobin and platelets. AST 53, ALT 37, alkaline phosphatase 119, total bilirubin 0.8. Ammonia was 18. CT of the brain unremarkable. Chest x-ray was negative.    Hospital Course:      Pt was admitted for ARF(creatinine 4.06)   pt was hydrated with ns iv. We held herbenicar/hydrochlorothiazide. Her ams improved with hydration and pt is currently at baseline.  Pt doing well and will be discharged to home today.      Follow UP  Follow-up Information    Philis Fendt, MD Follow up in 1 week(s).   Specialty:  Internal Medicine Contact information: [place] [address] [phone]            Consults obtained - none  Discharge Condition: stable  Diet and Activity  recommendation: See Discharge Instructions below  Discharge Instructions    Please f/u with pcp in 1-2 weeks.      Discharge Medications       Medication List    TAKE these medications   ACCU-CHEK AVIVA PLUS test strip Generic drug:  glucose blood As directed   ACCU-CHEK SOFTCLIX LANCETS lancets As directed   aspirin 81 MG tablet Take 1 tablet (81 mg total) by mouth daily.   atorvastatin 40 MG tablet Commonly known as:  LIPITOR Take 1 tablet (40 mg total) by mouth daily at 6 PM.   carvedilol 12.5 MG tablet Commonly known as:  COREG Take 1 tablet (12.5 mg total) by mouth 2 (two) times daily with a meal.   clopidogrel 75 MG tablet Commonly known as:  PLAVIX Take 1 tablet (75 mg total) by mouth daily.   COLCRYS 0.6 MG tablet Generic drug:  colchicine Take 0.6 mg by mouth 2 (two) times daily as needed (for gout symptoms).   gabapentin 300 MG capsule Commonly known as:  NEURONTIN Take 300 mg by mouth 3 (three) times daily.   insulin glargine 100 UNIT/ML injection Commonly known as:  LANTUS Inject 50-60 Units into the skin 2 (two) times daily. 60 units in the morning and 50 units at bedtime depending on BGL   olmesartan-hydrochlorothiazide 40-25 MG tablet Commonly known as:  BENICAR HCT Take 1 tablet by mouth daily.   omeprazole 20 MG capsule Commonly known as:  PRILOSEC Take 2 capsules (40 mg total) by mouth 2 (two) times daily before a meal. What changed:  how much to take   prednisoLONE acetate 1 % ophthalmic suspension Commonly known as:  PRED FORTE Place 1 drop into both eyes 4 (four) times daily.   PROAIR HFA 108 (90 Base) MCG/ACT inhaler Generic drug:  albuterol Inhale 2 puffs into the lungs every 6 (six) hours as needed for wheezing or shortness of breath.   ranitidine 300 MG tablet Commonly known as:  ZANTAC Take 1 tablet (300 mg total) by mouth at bedtime.   sucralfate 1 GM/10ML suspension Commonly known as:  CARAFATE Take 10 mLs (1 g total)  by mouth 3 (three) times daily.   TRADJENTA 5 MG Tabs tablet Generic drug:  linagliptin Take 5 mg by mouth daily.   traMADol 50 MG tablet Commonly known as:  ULTRAM Take 50 mg by mouth 2 (two) times daily as needed for moderate pain.   trimethoprim-polymyxin b ophthalmic solution Commonly known as:  POLYTRIM INSTILL 1 DROP IN LEFT EYE 4 TIMES DAILY FOR 2 DAYS AFTER EACH MONTHLY EYE INJECTION       Major procedures  and Radiology Reports - PLEASE review detailed and final reports for all details, in brief -     Ct Abdomen Pelvis Wo Contrast  Result Date: 08/02/2015 CLINICAL DATA:  Abdominal pain. History of hypertension and diabetes. EXAM: CT ABDOMEN AND PELVIS WITHOUT CONTRAST TECHNIQUE: Multidetector CT imaging of the abdomen and pelvis was performed following the standard protocol without IV contrast. COMPARISON:  [id number] FINDINGS: [place]. Unenhanced appearance of the liver, gallbladder, pancreas, inferior vena cava, and retroperitoneal lymph nodes is unremarkable. Sub cm exophytic lesion off of the spleen is new since previous study. Characterization is not possible due to small size. Interval development of a 3 cm diameter right adrenal gland nodule with poorly defined margins. Density measurements are intact. Exophytic lesions off of the right kidney, largest measuring 1.6 cm diameter. Density measures suggests a cyst. No hydronephrosis in either kidney. Calcification of the abdominal aorta without aneurysm. Stomach, small bowel, and colon are not abnormally distended. Stool fills the colon. No free air or free fluid in the abdomen. Pelvis: Diverticulosis of the sigmoid colon. No evidence of diverticulitis. Appendix is normal. Bladder wall is not thickened. No free or loculated pelvic fluid collections. Surgical absence of the uterus. Left ovary is her aunt at 3.7 x 6 cm, enlarging since previous study. She sound for further by IMPRESSION: Developing 3 cm nodule in right adrenal gland.  Hounsfield unit measurements are indeterminate and margins are somewhat poorly defined. Consider biochemical evaluation or MRI for further evaluation. New lesions in the right kidney and spleen, likely representing cysts. Enlarged left ovary. Suggest ultrasound follow-up. Electronically Signed   By: Lucienne Capers M.D.   On: 08/02/2015 05:37   Dg Chest 2 View  Result Date: 08/22/2015 CLINICAL DATA:  Altered mental status EXAM: CHEST  2 VIEW COMPARISON:  08/01/2015 FINDINGS: Cardiac shadow is not enlarged and accentuated by AP technique. No infiltrate or sizable effusion is seen. No acute bony abnormality is noted. Degenerative changes of the thoracic spine are seen. IMPRESSION: No active cardiopulmonary disease. Electronically Signed   By: Inez Catalina M.D.   On: 08/22/2015 15:14   Dg Chest 2 View  Result Date: 08/01/2015 CLINICAL DATA:  Heartburn for 1 week. Burning in chest and under left breast. EXAM: CHEST  2 VIEW COMPARISON:  Two-view chest x-ray 07/11/2015. FINDINGS: Heart size is normal. Lung volumes are low. No focal airspace disease present. There is no edema or effusion to suggest failure. The visualized soft tissues and bony thorax are unremarkable. IMPRESSION: 1. Low lung volumes. 2. No acute cardiopulmonary disease. Electronically Signed   By: San Morelle M.D.   On: 08/01/2015 21:16   Ct Head Wo Contrast  Result Date: 08/22/2015 CLINICAL DATA:  82 year old brought in for altered mental status and unresponsiveness. EXAM: CT HEAD WITHOUT CONTRAST TECHNIQUE: Contiguous axial images were obtained from the base of the skull through the vertex without intravenous contrast. COMPARISON:  07/11/2015 FINDINGS: There is moderate chronic ventriculomegaly which appears similar to previous exam. As described previously this remains outer for portion to the degree of sulcal enlargement. Patchy areas of low attenuation throughout the subcortical and periventricular white matter are identified  compatible with chronic microvascular disease. Chronic left basal ganglia lacunar infarct is noted. No evidence for acute brain infarct. No intracranial hemorrhage or mass identified. The paranasal sinuses and mastoid air cells appear clear. The calvarium is intact. IMPRESSION: 1. No acute intracranial abnormalities. 2. Chronic, moderate ventriculomegaly 3. Chronic small vessel ischemic change. Electronically Signed   By: Lovena Le  Clovis Riley M.D.   On: 08/22/2015 15:10   US Renal  Result Date: 08/23/2015 CLINICAL DATA:  82 year old female with a history of acute renal failure EXAM: RENAL / URINARY TRACT ULTRASOUND COMPLETE COMPARISON:  CT 08/02/2015 FINDINGS: Right Kidney: Length: 9.4 cm. Echogenicity similar to that of the adjacent liver. No hydronephrosis. No large echogenic focus of the right kidney. Left Kidney: Length: 9.7 cm. Echogenicity of the left kidney similar to that of the adjacent spleen parenchyma and of the contralateral kidney. No hydronephrosis. No large echogenic focus. Bladder: Appears normal for degree of bladder distention. IMPRESSION: No evidence of hydronephrosis. Signed, Dulcy Fanny. Earleen Newport, DO [facility] Radiology Electronically Signed   By: Corrie Mckusick D.O.   On: 08/23/2015 10:58    Micro Results     No results found for this or any previous visit (from the past 240 hour(s)).     Today   Subjective    Paula West today has no headache,no chest abdominal pain,no new weakness tingling or numbness, feels much better wants to go home today.    Objective   Blood pressure (!) 148/71, pulse (!) 56, temperature 98 F (36.7 C), temperature source Oral, resp. rate 18, SpO2 97 %.   Intake/Output Summary (Last 24 hours) at 08/25/15 0913 Last data filed at 08/25/15 [id number]  Gross per 24 hour  Intake             2125 ml  Output                0 ml  Net             2125 ml    Exam Awake Alert, Oriented x 3, No new F.N  deficits, Normal affect [place].AT,PERRAL Supple Neck,No JVD, No cervical lymphadenopathy appriciated.  Symmetrical Chest wall movement, Good air movement bilaterally, CTAB RRR,No Gallops,Rubs or new Murmurs, No Parasternal Heave +ve B.Sounds, Abd Soft, Non tender, No organomegaly appriciated, No rebound -guarding or rigidity. No Cyanosis, Clubbing or edema, No new Rash or bruise Gait instability   Data Review   CBC w Diff: Lab Results  Component Value Date   WBC 8.6 08/24/2015   HGB 13.4 08/24/2015   HCT 40.3 08/24/2015   PLT 200 08/24/2015   LYMPHOPCT 18 08/22/2015   MONOPCT 6 08/22/2015   EOSPCT 1 08/22/2015   BASOPCT 0 08/22/2015    CMP: Lab Results  Component Value Date   NA 136 08/25/2015   K 4.4 08/25/2015   CL 108 08/25/2015   CO2 20 (L) 08/25/2015   BUN 30 (H) 08/25/2015   CREATININE 1.34 (H) 08/25/2015   PROT 6.9 08/23/2015   ALBUMIN 3.5 08/23/2015   BILITOT 0.6 08/23/2015   ALKPHOS 118 08/23/2015   AST 47 (H) 08/23/2015   ALT 36 08/23/2015  .   Total Time in preparing paper work, data evaluation and todays exam - 23 minutes  Jani Gravel M.D on 08/25/2015 at 9:13 AM  [facility]   Office  [phone]

## 2015-08-25 NOTE — Progress Notes (Signed)
Patient discharged home with her sister and care giver. Medications and discharge instructions were gone over with them and they stated that they understood. IV was dc'd and was intact.

## 2015-08-25 NOTE — Care Management Note (Signed)
Case Management Note Marvetta Gibbons RN, BSN Unit 2W-Case Manager [phone]  Patient Details  Name: Paula West MRN: [id number] Date of Birth: 1932/11/17  Subjective/Objective:    Pt admitted with AKI                Action/Plan: PTA pt lived at home with daughter- has aide with [facility] - and also active with [facility] for HHRN/PT- will need orders for resumption of [place] orders for discharge- have spoken with Manuela Schwartz at [facility] regarding discharge- have paged MD for [facility] resumption orders.   Expected Discharge Date:     08/25/15             Expected Discharge Plan:  [place]  In-House Referral:     Discharge planning Services  CM Consult  Post Acute Care Choice:  Home Health, Resumption of Svcs/PTA Provider Choice offered to:     DME Arranged:    DME Agency:     HH Arranged:  RN, PT [place] Agency:  [facility]  Status of Service:  Completed, signed off  If discussed at [place] of Stay Meetings, dates discussed:    Additional Comments:  Dawayne Patricia, RN 08/25/2015, 2:41 PM

## 2015-08-25 NOTE — Discharge Instructions (Signed)
Please follow up with your pcp in 1 -2 weeks for recheck of your kidney function

## 2015-08-26 ENCOUNTER — Ambulatory Visit: Payer: Self-pay

## 2015-09-02 ENCOUNTER — Ambulatory Visit (INDEPENDENT_AMBULATORY_CARE_PROVIDER_SITE_OTHER): Payer: Medicare Other | Admitting: Neurology

## 2015-09-02 ENCOUNTER — Encounter: Payer: Self-pay | Admitting: Neurology

## 2015-09-02 VITALS — BP 118/84 | HR 74 | Temp 97.0°F | Ht 64.0 in

## 2015-09-02 DIAGNOSIS — I6381 Other cerebral infarction due to occlusion or stenosis of small artery: Secondary | ICD-10-CM

## 2015-09-02 DIAGNOSIS — I6350 Cerebral infarction due to unspecified occlusion or stenosis of unspecified cerebral artery: Secondary | ICD-10-CM | POA: Diagnosis not present

## 2015-09-02 NOTE — Patient Instructions (Signed)
  Remember to drink plenty of fluid, eat healthy meals and do not skip any meals. Try to eat protein with a every meal and eat a healthy snack such as fruit or nuts in between meals. Try to keep a regular sleep-wake schedule and try to exercise daily, particularly in the form of walking, 20-30 minutes a day, if you can.   As far as your medications are concerned, I would like to suggest: Continue current medications  I would like to see you back in 6 months, sooner if we need to. Please call us with any interim questions, concerns, problems, updates or refill requests.   Our phone number is [phone]. We also have an after hours call service for urgent matters and there is a physician on-call for urgent questions. For any emergencies you know to call 911 or go to the nearest emergency room   

## 2015-09-02 NOTE — Progress Notes (Signed)
[facility]    Provider:  Dr Jaynee Eagles  CC:  Follow up stroke  HPI:  Paula West is a 82 y.o. female here as a follow up for stroke. History of asthma/COPD, hypertension, hyperlipidemia, diabetes mellitus, and chronic kidney disease  presenting with right-sided weakness and speech difficulties.   Paula West is an 82 y.o. female who was at home in June with family when she was noted to have a right facial droop and left facial movement with talking, right sideed weakness and speech abnormality. LSN was 11:10. She was brought to [facility] as a code stroke. At time of arrival she had no right sided weakness mild right facial asymmetry when smiling and no speech abnormalities. She was brought to CT. CT showed no acute changes. Per family she has improved but continues to have dysarthria. At baseline she uses a walker and scoots around in a wheel chair. Given her improvement tPA was not given. MRI showed acute left thalamic infarct.  Here with daughter. She continues to have difficulty with dysarthria. she is not eating or drinking. Admitted to [facility] again recently for AMS secondary to dehydration and ARF. She is going into [facility] today. Her speech has improved slightly. Nothing significant. Worsening cognition. She is forgetting things a lot. She sleeps a lot. Before the stroke she was talking, going to the refrigerator, making food. She had therapy. She also had speech therapy. No significant improvement. She goes to the primary care on the 18th. She has not drank any water in the last few days, yesterday she had 2 glasses but none today maybe a few sips with water. DIscussed patient may need a feeding tube if she continues to refuse to drink or eat. If she becomes more lethargic she will have to go to the ED again.   CT head: 09/11/15: personally reviewed and agree with the following:  IMPRESSION: 1. No acute intracranial abnormalities. 2. Chronic, moderate  ventriculomegaly 3. Chronic small vessel ischemic change.   Mr Jodene Nam Head/brain Wo Cm 07/11/2015   1. Acute left thalamic lacunar infarct.  2. Chronic, moderate ventriculomegaly which may reflect central predominant cerebral atrophy or hydrocephalus. Minimally increased periventricular white matter T2 changes from 2012 favored to reflect chronic small vessel ischemia.  3. No major intracranial arterial occlusion. Atherosclerotic changes with new, mild right ICA terminus stenosis.  4. Unchanged bilateral PCA stenoses.   Review of Systems: Patient complains of symptoms per HPI as well as the following symptoms: lethargy, sleepiness, decr appetite and refusal to drink water. Pertinent negatives per HPI. All others negative.   Social History   Social History  . Marital status: Widowed    Spouse name: N/A  . Number of children: N/A  . Years of education: N/A   Occupational History  . Not on file.   Social History Main Topics  . Smoking status: Former Research scientist (life sciences)  . Smokeless tobacco: Former Systems developer  . Alcohol use No  . Drug use: No  . Sexual activity: Not on file   Other Topics Concern  . Not on file   Social History Narrative   Lives   Caffeine use:     Family History  Problem Relation Age of Onset  . Diabetes Sister   . Stroke Mother   . CAD Neg Hx     Past Medical History:  Diagnosis Date  . Asthma   . CKD (chronic kidney disease), stage II   . COPD (chronic obstructive pulmonary disease) ([place])   .  Diabetes mellitus   . Hyperlipidemia   . Hypertension     No past surgical history on file.  Current Outpatient Prescriptions  Medication Sig Dispense Refill  . ACCU-CHEK AVIVA PLUS test strip As directed  1  . ACCU-CHEK SOFTCLIX LANCETS lancets As directed  1  . albuterol (PROAIR HFA) 108 (90 Base) MCG/ACT inhaler Inhale 2 puffs into the lungs every 6 (six) hours as needed for wheezing or shortness of breath.    Marland Kitchen aspirin 81 MG tablet Take 1 tablet (81 mg total) by  mouth daily. 30 tablet 1  . atorvastatin (LIPITOR) 40 MG tablet Take 1 tablet (40 mg total) by mouth daily at 6 PM. 30 tablet 1  . carvedilol (COREG) 12.5 MG tablet Take 1 tablet (12.5 mg total) by mouth 2 (two) times daily with a meal. 60 tablet 1  . clopidogrel (PLAVIX) 75 MG tablet Take 1 tablet (75 mg total) by mouth daily. 30 tablet 1  . colchicine (COLCRYS) 0.6 MG tablet Take 0.6 mg by mouth 2 (two) times daily as needed (for gout symptoms).    . gabapentin (NEURONTIN) 300 MG capsule Take 300 mg by mouth 3 (three) times daily.     . insulin glargine (LANTUS) 100 UNIT/ML injection Inject 50-60 Units into the skin 2 (two) times daily. 60 units in the morning and 50 units at bedtime depending on BGL    . linagliptin (TRADJENTA) 5 MG TABS tablet Take 5 mg by mouth daily.    Marland Kitchen olmesartan-hydrochlorothiazide (BENICAR HCT) 40-25 MG per tablet Take 1 tablet by mouth daily.      Marland Kitchen omeprazole (PRILOSEC) 20 MG capsule Take 2 capsules (40 mg total) by mouth 2 (two) times daily before a meal. (Patient taking differently: Take 20 mg by mouth 2 (two) times daily before a meal. ) 120 capsule 1  . prednisoLONE acetate (PRED FORTE) 1 % ophthalmic suspension Place 1 drop into both eyes 4 (four) times daily.    . ranitidine (ZANTAC) 300 MG tablet Take 1 tablet (300 mg total) by mouth at bedtime. 30 tablet 1  . sucralfate (CARAFATE) 1 GM/10ML suspension Take 10 mLs (1 g total) by mouth 3 (three) times daily. 420 mL 1  . traMADol (ULTRAM) 50 MG tablet Take 50 mg by mouth 2 (two) times daily as needed for moderate pain.    Marland Kitchen trimethoprim-polymyxin b (POLYTRIM) ophthalmic solution INSTILL 1 DROP IN LEFT EYE 4 TIMES DAILY FOR 2 DAYS AFTER EACH MONTHLY EYE INJECTION  1   No current facility-administered medications for this visit.     Allergies as of 09/02/2015 - Review Complete 08/22/2015  Allergen Reaction Noted  . Codeine Rash 08/01/2015  . Darvon [propoxyphene] Rash 08/01/2015  . Penicillins Rash 08/01/2015      Vitals: BP 118/84 (BP Location: Left Arm, Patient Position: Sitting, Cuff Size: Normal)   Pulse 74   Temp 97 F (36.1 C) (Oral)   Ht 5\' 4"  (1.626 m)  Last Weight:  Wt Readings from Last 1 Encounters:  08/05/15 165 lb 11.2 oz (75.2 kg)   Last Height:   Ht Readings from Last 1 Encounters:  09/02/15 5\' 4"  (1.626 m)       PHYSICAL EXAM Physical exam: Exam: Gen: sleeping, lethargic but arouses Oriented to name, date of birth, very low voice, not oriented to date Not alert, somnolence but arousable, hypophonic Eyes: anicteric sclerae, moist conjunctivae  CV: no MRG, no carotid bruits, no peripheral edema   Speech:    Dysarthric, no apparent aphasia.  Cranial Nerves:    The pupils are equal, round, and reactive to light.. Attempted, Fundi not visualized due to small pupils.  EOMI.  Visual fields full. Mild right lower facial droop. Tongue midline. Hearing intact to voice. Shoulder shrug intact. She reports normal sensation bilateral face. Uvula rises symmetrically.  Motor Observation:    no involuntary movements noted. Tone appears normal.     Strength:Patient refuses strength exam, will not lift arms, shrugs shoulders      Cerebellar: attempted patient not cooperative  Sensation:  Intact to LT in all 4 extremities, no sensory deficits per patient.  Plantars downgoing.    ASSESSMENT/PLAN Ms. Paula West is a 82 y.o. female with history of asthma/COPD, hypertension, hyperlipidemia, diabetes mellitus, and chronic kidney disease  presenting with right-sided weakness and speech difficulties. She did not receive IV t-PA due to improvement in deficits.  Stroke:  Dominant infarct secondary to small vessel disease.  Resultant  dysarthria and mild facial droop.   MRI  - Acute left thalamic lacunar infarct.   MRA - No major intracranial arterial occlusion.  Carotid Doppler - 1-39% ICA plaquing.   2D Echo  EF 55-60%. No cardiac source of  emboli identified.  LDL - 103  HgbA1c - 8.6  aspirin 81 mg daily for stroke protection and counseled for aggressive stroke risk management.  Hyperlipidemia   Lipitor 40 mg daily.  HgbA1c 8.6, goal < 7.0  Uncontrolled  Other Stroke Risk Factors  Advanced age   Other Active Problems  Patient refusing to drink water, sleepy today, recent admission for dehydration and ARF. If patient's condition worsens she may need to go to the ED again. Also may need alternate ways to administer nutrition and fluids if she continues to refuse.    I had a long d/w about her recent stroke, risk for recurrent stroke/TIAs, personally independently reviewed imaging studies and stroke evaluation results and answered questions.Continue aspirin 81 mg for secondary stroke prevention and maintain strict control of hypertension with blood pressure goal below 130/90, diabetes with hemoglobin A1c goal below 6.5% and lipids with LDL cholesterol goal below 70 mg/dL. I also advised the patient to eat a healthy diet with plenty of whole grains, cereals, fruits and vegetables, exercise regularly and maintain ideal body weight .Followup in the future with me in6 months  Sarina Ill, MD  [facility] [address] [place], [place] [address]  Phone [phone] Fax [phone]  A total of 30 minutes was spent face-to-face with this patient. Over half this time was spent on counseling patient on the small vessel lacunar stroke diagnosis and different diagnostic and therapeutic options available.

## 2015-09-07 ENCOUNTER — Encounter (HOSPITAL_COMMUNITY): Payer: Self-pay | Admitting: Emergency Medicine

## 2015-09-07 ENCOUNTER — Inpatient Hospital Stay (HOSPITAL_COMMUNITY)
Admission: EM | Admit: 2015-09-07 | Discharge: 2015-09-15 | DRG: 682 | Disposition: A | Discharge status: Hospice | Payer: Medicare Other | Attending: Internal Medicine | Admitting: Internal Medicine

## 2015-09-07 ENCOUNTER — Emergency Department (HOSPITAL_COMMUNITY): Payer: Medicare Other

## 2015-09-07 DIAGNOSIS — R4182 Altered mental status, unspecified: Secondary | ICD-10-CM | POA: Diagnosis present

## 2015-09-07 DIAGNOSIS — G8191 Hemiplegia, unspecified affecting right dominant side: Secondary | ICD-10-CM

## 2015-09-07 DIAGNOSIS — R1310 Dysphagia, unspecified: Secondary | ICD-10-CM

## 2015-09-07 DIAGNOSIS — E785 Hyperlipidemia, unspecified: Secondary | ICD-10-CM | POA: Diagnosis present

## 2015-09-07 DIAGNOSIS — I10 Essential (primary) hypertension: Secondary | ICD-10-CM | POA: Diagnosis not present

## 2015-09-07 DIAGNOSIS — Z7902 Long term (current) use of antithrombotics/antiplatelets: Secondary | ICD-10-CM | POA: Diagnosis not present

## 2015-09-07 DIAGNOSIS — N390 Urinary tract infection, site not specified: Secondary | ICD-10-CM | POA: Diagnosis present

## 2015-09-07 DIAGNOSIS — R627 Adult failure to thrive: Secondary | ICD-10-CM | POA: Diagnosis present

## 2015-09-07 DIAGNOSIS — Z7982 Long term (current) use of aspirin: Secondary | ICD-10-CM

## 2015-09-07 DIAGNOSIS — E860 Dehydration: Secondary | ICD-10-CM | POA: Diagnosis present

## 2015-09-07 DIAGNOSIS — J449 Chronic obstructive pulmonary disease, unspecified: Secondary | ICD-10-CM | POA: Diagnosis present

## 2015-09-07 DIAGNOSIS — E279 Disorder of adrenal gland, unspecified: Secondary | ICD-10-CM | POA: Diagnosis present

## 2015-09-07 DIAGNOSIS — Z87891 Personal history of nicotine dependence: Secondary | ICD-10-CM

## 2015-09-07 DIAGNOSIS — Z515 Encounter for palliative care: Secondary | ICD-10-CM | POA: Diagnosis not present

## 2015-09-07 DIAGNOSIS — I214 Non-ST elevation (NSTEMI) myocardial infarction: Secondary | ICD-10-CM | POA: Diagnosis present

## 2015-09-07 DIAGNOSIS — I252 Old myocardial infarction: Secondary | ICD-10-CM | POA: Diagnosis not present

## 2015-09-07 DIAGNOSIS — N183 Chronic kidney disease, stage 3 unspecified: Secondary | ICD-10-CM | POA: Diagnosis present

## 2015-09-07 DIAGNOSIS — I69991 Dysphagia following unspecified cerebrovascular disease: Secondary | ICD-10-CM | POA: Diagnosis not present

## 2015-09-07 DIAGNOSIS — E43 Unspecified severe protein-calorie malnutrition: Secondary | ICD-10-CM | POA: Diagnosis present

## 2015-09-07 DIAGNOSIS — I69351 Hemiplegia and hemiparesis following cerebral infarction affecting right dominant side: Secondary | ICD-10-CM | POA: Diagnosis not present

## 2015-09-07 DIAGNOSIS — I6340 Cerebral infarction due to embolism of unspecified cerebral artery: Secondary | ICD-10-CM | POA: Diagnosis not present

## 2015-09-07 DIAGNOSIS — G9341 Metabolic encephalopathy: Secondary | ICD-10-CM | POA: Diagnosis present

## 2015-09-07 DIAGNOSIS — E278 Other specified disorders of adrenal gland: Secondary | ICD-10-CM | POA: Diagnosis present

## 2015-09-07 DIAGNOSIS — N179 Acute kidney failure, unspecified: Principal | ICD-10-CM | POA: Diagnosis present

## 2015-09-07 DIAGNOSIS — Z66 Do not resuscitate: Secondary | ICD-10-CM

## 2015-09-07 DIAGNOSIS — I639 Cerebral infarction, unspecified: Secondary | ICD-10-CM | POA: Diagnosis not present

## 2015-09-07 DIAGNOSIS — I69322 Dysarthria following cerebral infarction: Secondary | ICD-10-CM

## 2015-09-07 DIAGNOSIS — E1165 Type 2 diabetes mellitus with hyperglycemia: Secondary | ICD-10-CM

## 2015-09-07 DIAGNOSIS — E876 Hypokalemia: Secondary | ICD-10-CM | POA: Diagnosis not present

## 2015-09-07 DIAGNOSIS — E1122 Type 2 diabetes mellitus with diabetic chronic kidney disease: Secondary | ICD-10-CM | POA: Diagnosis present

## 2015-09-07 DIAGNOSIS — Z794 Long term (current) use of insulin: Secondary | ICD-10-CM | POA: Diagnosis not present

## 2015-09-07 DIAGNOSIS — I129 Hypertensive chronic kidney disease with stage 1 through stage 4 chronic kidney disease, or unspecified chronic kidney disease: Secondary | ICD-10-CM | POA: Diagnosis present

## 2015-09-07 DIAGNOSIS — J438 Other emphysema: Secondary | ICD-10-CM | POA: Diagnosis not present

## 2015-09-07 DIAGNOSIS — I6381 Other cerebral infarction due to occlusion or stenosis of small artery: Secondary | ICD-10-CM | POA: Diagnosis present

## 2015-09-07 DIAGNOSIS — E119 Type 2 diabetes mellitus without complications: Secondary | ICD-10-CM

## 2015-09-07 DIAGNOSIS — R630 Anorexia: Secondary | ICD-10-CM | POA: Diagnosis not present

## 2015-09-07 LAB — CBC WITH DIFFERENTIAL/PLATELET
Basophils Absolute: 0.1 10*3/uL (ref 0.0–0.1)
Basophils Relative: 1 %
Eosinophils Absolute: 0.4 10*3/uL (ref 0.0–0.7)
Eosinophils Relative: 5 %
HCT: 41.4 % (ref 36.0–46.0)
Hemoglobin: 13.5 g/dL (ref 12.0–15.0)
Lymphocytes Relative: 16 %
Lymphs Abs: 1.4 10*3/uL (ref 0.7–4.0)
MCH: 26.3 pg (ref 26.0–34.0)
MCHC: 32.6 g/dL (ref 30.0–36.0)
MCV: 80.7 fL (ref 78.0–100.0)
Monocytes Absolute: 1 10*3/uL (ref 0.1–1.0)
Monocytes Relative: 11 %
Neutro Abs: 6 10*3/uL (ref 1.7–7.7)
Neutrophils Relative %: 67 %
Platelets: 130 10*3/uL — ABNORMAL LOW (ref 150–400)
RBC: 5.13 MIL/uL — ABNORMAL HIGH (ref 3.87–5.11)
RDW: 16.6 % — ABNORMAL HIGH (ref 11.5–15.5)
WBC: 8.8 10*3/uL (ref 4.0–10.5)

## 2015-09-07 LAB — COMPREHENSIVE METABOLIC PANEL
ALT: 25 U/L (ref 14–54)
AST: 22 U/L (ref 15–41)
Albumin: 2.9 g/dL — ABNORMAL LOW (ref 3.5–5.0)
Alkaline Phosphatase: 113 U/L (ref 38–126)
Anion gap: 9 (ref 5–15)
BUN: 63 mg/dL — ABNORMAL HIGH (ref 6–20)
CO2: 18 mmol/L — ABNORMAL LOW (ref 22–32)
Calcium: 9 mg/dL (ref 8.9–10.3)
Chloride: 111 mmol/L (ref 101–111)
Creatinine, Ser: 2.02 mg/dL — ABNORMAL HIGH (ref 0.44–1.00)
GFR calc Af Amer: 26 mL/min — ABNORMAL LOW (ref >60)
GFR calc non Af Amer: 22 mL/min — ABNORMAL LOW (ref >60)
Glucose, Bld: 197 mg/dL — ABNORMAL HIGH (ref 65–99)
Potassium: 3.8 mmol/L (ref 3.5–5.1)
Sodium: 138 mmol/L (ref 135–145)
Total Bilirubin: 0.7 mg/dL (ref 0.3–1.2)
Total Protein: 6.6 g/dL (ref 6.5–8.1)

## 2015-09-07 LAB — MAGNESIUM: Magnesium: 2 mg/dL (ref 1.7–2.4)

## 2015-09-07 LAB — LACTIC ACID, PLASMA
LACTIC ACID, VENOUS: 2.4 mmol/L — AB (ref 0.5–1.9)
Lactic Acid, Venous: 2.7 mmol/L (ref 0.5–1.9)

## 2015-09-07 LAB — I-STAT CG4 LACTIC ACID, ED: Lactic Acid, Venous: 1.91 mmol/L (ref 0.5–1.9)

## 2015-09-07 LAB — URINALYSIS, ROUTINE W REFLEX MICROSCOPIC
Bilirubin Urine: NEGATIVE
Glucose, UA: 100 mg/dL — AB
Ketones, ur: NEGATIVE mg/dL
Nitrite: NEGATIVE
Protein, ur: 30 mg/dL — AB
Specific Gravity, Urine: 1.02 (ref 1.005–1.030)
pH: 5 (ref 5.0–8.0)

## 2015-09-07 LAB — URINE MICROSCOPIC-ADD ON

## 2015-09-07 LAB — MRSA PCR SCREENING: MRSA by PCR: NEGATIVE

## 2015-09-07 LAB — GLUCOSE, CAPILLARY
GLUCOSE-CAPILLARY: 152 mg/dL — AB (ref 65–99)
GLUCOSE-CAPILLARY: 116 mg/dL — AB (ref 65–99)

## 2015-09-07 LAB — PROCALCITONIN: PROCALCITONIN: 0.1 ng/mL

## 2015-09-07 LAB — PHOSPHORUS: Phosphorus: 2.2 mg/dL — ABNORMAL LOW (ref 2.5–4.6)

## 2015-09-07 MED ORDER — ACETAMINOPHEN 325 MG PO TABS: 650.0000 mg | ORAL_TABLET | Freq: Four times a day (QID) | ORAL | Status: DC | PRN

## 2015-09-07 MED ORDER — ACETAMINOPHEN 650 MG RE SUPP: 650.0000 mg | Freq: Four times a day (QID) | RECTAL | Status: DC | PRN

## 2015-09-07 MED ORDER — PREDNISOLONE ACETATE 1 % OP SUSP
1.0000 [drp] | Freq: Four times a day (QID) | OPHTHALMIC | Status: DC
  Filled 2015-09-07: qty 1

## 2015-09-07 MED ORDER — ENOXAPARIN SODIUM 30 MG/0.3ML ~~LOC~~ SOLN
30.0000 mg | SUBCUTANEOUS | Status: DC
  Administered 2015-09-07 – 2015-09-10 (×4): 30 mg via SUBCUTANEOUS
  Filled 2015-09-07 (×4): qty 0.3

## 2015-09-07 MED ORDER — SODIUM CHLORIDE 0.9 % IV SOLN
INTRAVENOUS | Status: DC
  Administered 2015-09-07 – 2015-09-08 (×2): via INTRAVENOUS

## 2015-09-07 MED ORDER — INSULIN ASPART 100 UNIT/ML ~~LOC~~ SOLN
0.0000 [IU] | SUBCUTANEOUS | Status: DC
  Administered 2015-09-07: 2 [IU] via SUBCUTANEOUS
  Administered 2015-09-09 (×2): 1 [IU] via SUBCUTANEOUS
  Administered 2015-09-10: 2 [IU] via SUBCUTANEOUS
  Administered 2015-09-10 (×2): 1 [IU] via SUBCUTANEOUS
  Administered 2015-09-11: 3 [IU] via SUBCUTANEOUS
  Administered 2015-09-11 (×3): 2 [IU] via SUBCUTANEOUS
  Administered 2015-09-12: 1 [IU] via SUBCUTANEOUS
  Administered 2015-09-12 (×2): 2 [IU] via SUBCUTANEOUS
  Administered 2015-09-12 – 2015-09-13 (×3): 1 [IU] via SUBCUTANEOUS
  Administered 2015-09-13 – 2015-09-14 (×6): 2 [IU] via SUBCUTANEOUS
  Administered 2015-09-14: 1 [IU] via SUBCUTANEOUS

## 2015-09-07 MED ORDER — CIPROFLOXACIN IN D5W 400 MG/200ML IV SOLN
400.0000 mg | INTRAVENOUS | Status: DC
  Administered 2015-09-08 – 2015-09-10 (×3): 400 mg via INTRAVENOUS
  Filled 2015-09-07 (×3): qty 200

## 2015-09-07 MED ORDER — SODIUM CHLORIDE 0.9 % IV BOLUS (SEPSIS)
1000.0000 mL | Freq: Once | INTRAVENOUS | Status: AC
  Administered 2015-09-07: 1000 mL via INTRAVENOUS

## 2015-09-07 MED ORDER — ALBUTEROL SULFATE (2.5 MG/3ML) 0.083% IN NEBU
3.0000 mL | INHALATION_SOLUTION | Freq: Four times a day (QID) | RESPIRATORY_TRACT | Status: DC | PRN
Start: 2015-09-07 — End: 2015-09-15

## 2015-09-07 MED ORDER — CIPROFLOXACIN IN D5W 400 MG/200ML IV SOLN
400.0000 mg | Freq: Once | INTRAVENOUS | Status: AC
  Administered 2015-09-07: 400 mg via INTRAVENOUS
  Filled 2015-09-07: qty 200

## 2015-09-07 MED ORDER — PREDNISOLONE ACETATE 1 % OP SUSP
1.0000 [drp] | Freq: Four times a day (QID) | OPHTHALMIC | Status: DC
  Administered 2015-09-07 – 2015-09-14 (×27): 1 [drp] via OPHTHALMIC
  Filled 2015-09-07 (×2): qty 5

## 2015-09-07 NOTE — ED Triage Notes (Signed)
In EMS from [facility], there for stroke approx 2 mo ago. Per facility pt was dx with UTI few days ago and has been receiving rocephin IV. Pt has became more lethargic, unable to feed self, unable to follow commands. RR with EMS 38.

## 2015-09-07 NOTE — Progress Notes (Signed)
CRITICAL VALUE ALERT  Critical value received:  Lactic acid 2.7  Date of notification:  09/07/2015  Time of notification:  2010  Critical value read back: yes  Nurse who received alert:  Jari Sportsman, RN  MD notified (1st page):  Patrici Ranks  Time of first page: 2018  MD notified (2nd page): L. [place]  Time of second page: 2018  Responding MD:  Patrici Ranks  Time MD responded:  2028

## 2015-09-07 NOTE — H&P (Signed)
History and Physical    Paula West [id number] DOB: 18-Nov-1932 DOA: 09/07/2015   PCP: No PCP Per Patient / UNASSIGNED  Patient coming from/Resides with: Skilled nursing facility  Chief Complaint: Altered mentation  HPI: Paula West is a 82 y.o. female with medical history significant for diabetes on oral agents, stage III chronic kidney disease, COPD, hypertension, dyslipidemia and recent thalamic stroke in June 2017 with residual dysarthria. Patient discharged to a skilled nursing facility. Since her stroke she has been admitted to the hospital twice: The first in mid July for chest pain and non-STEMI with recommendations to continue medical therapy upon discharge; the second was in early August for acute kidney injury secondary to dehydration from failure to thrive. At that time an incidental 3 cm nodule in the right adrenal gland was noted with recommendations from the radiology to obtain MRI once kidney function documented stable.  Patient was sent back to the ER today from a skilled nursing facility due to altered mentation. Per the facility patient was diagnosed with UTI several days ago and was reported to have been receiving IV Rocephin since that time (unclear as to if/when Rocephin actually started based on review of facility medication record). It was reported the patient is more lethargic and unable to feed herself and unable to follow commands. Upon arrival of EMS patient was noted to be tachypneic with a respiratory rate of 38 breaths per minute. [facility] documented a temperature of 100.3 today. At baseline patient is dysarthric and apparently aphasic since recent stroke. No family was at bedside during Porter is evaluation nor with hospitalist evaluation.  ED Course:  Vital signs: Rectal temp 98.8-BP 127/71-pulse 65-respirations 22-room air sats 94% PCXR: No edema or consolidation Lab data: Sodium 138, potassium 3.8, CO2 18, BUN 63, creatinine 2.02, glucose  197, albumin 2.9, lactic acid 1.91, WBCs 8800 with normal neutrophils and normal absolute neutrophils, hemoglobin 13.5 platelets 130,000, urinalysis abnormal and concerning for UTI: Turbid appearance, many bacteria, 100 glucose, large hemoglobin, large leukocytes, nitrite negative, 6-30 squamous epithelials, too numerous to count WBCs; blood cultures and urine culture obtained in the ER Medications and treatment: Cipro 400 mg IV 1, normal saline bolus 1 L  Review of Systems:  **Unable to obtain from patient secondary to underlying altered mentation and dysarthria/aphasia-history obtained from the medical record   Past Medical History:  Diagnosis Date  . Asthma   . CKD (chronic kidney disease), stage II   . COPD (chronic obstructive pulmonary disease) ([place])   . Diabetes mellitus   . Hyperlipidemia   . Hypertension     History reviewed. No pertinent surgical history.  Social History   Social History  . Marital status: Widowed    Spouse name: N/A  . Number of children: N/A  . Years of education: N/A   Occupational History  . Not on file.   Social History Main Topics  . Smoking status: Former Research scientist (life sciences)  . Smokeless tobacco: Former Systems developer  . Alcohol use No  . Drug use: No  . Sexual activity: Not on file   Other Topics Concern  . Not on file   Social History Narrative   Lives   Caffeine use:     Mobility: Based on July evaluation home health PT only recommended her supervision mobility and no equipment recommended Work history: Retired   Allergies  Allergen Reactions  . Codeine Rash  . Darvon [Propoxyphene] Rash  . Penicillins Rash    Has patient had a PCN reaction  causing immediate rash, facial/tongue/throat swelling, SOB or lightheadedness with hypotension: Yes Has patient had a PCN reaction causing severe rash involving mucus membranes or skin necrosis: No Has patient had a PCN reaction that required hospitalization No Has patient had a PCN reaction occurring within  the last 10 years: No If all of the above answers are "NO", then may proceed with Cephalosporin use.     Family History  Problem Relation Age of Onset  . Diabetes Sister   . Stroke Mother   . CAD Neg Hx      Prior to Admission medications   Medication Sig Start Date End Date Taking? Authorizing Provider  albuterol (PROAIR HFA) 108 (90 Base) MCG/ACT inhaler Inhale 2 puffs into the lungs every 6 (six) hours as needed for wheezing or shortness of breath.   Yes Historical Provider, MD  aspirin 81 MG tablet Take 1 tablet (81 mg total) by mouth daily. 07/12/15  Yes [name], MD  atorvastatin (LIPITOR) 40 MG tablet Take 1 tablet (40 mg total) by mouth daily at 6 PM. 07/13/15  Yes [name], MD  carvedilol (COREG) 12.5 MG tablet Take 1 tablet (12.5 mg total) by mouth 2 (two) times daily with a meal. 08/05/15  Yes [name], MD  CEFTRIAXONE SODIUM IV Inject 1 g into the vein daily.   Yes Historical Provider, MD  clopidogrel (PLAVIX) 75 MG tablet Take 1 tablet (75 mg total) by mouth daily. 08/05/15  Yes [name], MD  colchicine (COLCRYS) 0.6 MG tablet Take 0.6 mg by mouth 2 (two) times daily.    Yes Historical Provider, MD  gabapentin (NEURONTIN) 300 MG capsule Take 300 mg by mouth 3 (three) times daily.   Yes Historical Provider, MD  linagliptin (TRADJENTA) 5 MG TABS tablet Take 5 mg by mouth daily.   Yes Historical Provider, MD  losartan-hydrochlorothiazide (HYZAAR) 100-25 MG tablet Take 1 tablet by mouth daily.   Yes Historical Provider, MD  omeprazole (PRILOSEC) 20 MG capsule Take 2 capsules (40 mg total) by mouth 2 (two) times daily before a meal. 08/05/15  Yes [name], MD  prednisoLONE acetate (PRED FORTE) 1 % ophthalmic suspension Place 1 drop into both eyes 4 (four) times daily. 08/12/15  Yes Historical Provider, MD  ranitidine (ZANTAC) 300 MG tablet Take 1 tablet (300 mg total) by mouth at bedtime. 08/05/15  Yes [name], MD  sucralfate (CARAFATE) 1 GM/10ML suspension Take 10  mLs (1 g total) by mouth 3 (three) times daily. 08/05/15  Yes [name], MD  traMADol (ULTRAM) 50 MG tablet Take 50 mg by mouth every 12 (twelve) hours as needed for moderate pain.   Yes Historical Provider, MD    Physical Exam: Vitals:   09/07/15 1216 09/07/15 1230 09/07/15 1300 09/07/15 1430  BP: 120/66 109/75 127/71 133/63  Pulse: 68 65 65 (!) 59  Resp: (!) 28 19 22 23   Temp: 98.8 F (37.1 C)     TempSrc: Rectal     SpO2: 100% 96% 94% 95%  Weight:      Height:          Constitutional: NAD, calm, comfortable Eyes: PERRL, lids and conjunctivae normal ENMT: Mucous membranes are dry. Posterior pharynx clear of any exudate or lesions. poor dentition.  Neck: normal, supple, no masses, no thyromegaly Respiratory: clear to auscultation bilaterally, no wheezing, no crackles. Normal respiratory effort. No accessory muscle use.  Cardiovascular: Regular rate and rhythm, no murmurs / rubs / gallops. No extremity edema. 2+ pedal pulses.  No carotid bruits.  Abdomen: no tenderness, no masses palpated. No hepatosplenomegaly. Bowel sounds positive.  Musculoskeletal: no clubbing / cyanosis. No joint deformity upper and lower extremities. Good ROM, no contractures. Normal muscle tone.  Skin: no rashes, lesions, ulcers. No induration Neurologic: CN 2-12 grossly intact on visual inspection. Sensation grossly intact based on patient response to tactile stimulation during exam, DTR normal. Navel to test strength given patient's inability to follow commands-seems to be moving all 4 extremities somewhat purposefully Psychiatric: Dysarthric/aphasic so unable to obtain accurate history   Labs on Admission: I have personally reviewed following labs and imaging studies  CBC:  Recent Labs Lab 09/07/15 1320  WBC 8.8  NEUTROABS 6.0  HGB 13.5  HCT 41.4  MCV 80.7  PLT [id number]   Basic Metabolic Panel:  Recent Labs Lab 09/07/15 1320  NA 138  K 3.8  CL 111  CO2 18*  GLUCOSE 197*  BUN 63*    CREATININE 2.02*  CALCIUM 9.0   GFR: Estimated Creatinine Clearance: 22 mL/min (by C-G formula based on SCr of 2.02 mg/dL). Liver Function Tests:  Recent Labs Lab 09/07/15 1320  AST 22  ALT 25  ALKPHOS 113  BILITOT 0.7  PROT 6.6  ALBUMIN 2.9*   No results for input(s): LIPASE, AMYLASE in the last 168 hours. No results for input(s): AMMONIA in the last 168 hours. Coagulation Profile: No results for input(s): INR, PROTIME in the last 168 hours. Cardiac Enzymes: No results for input(s): CKTOTAL, CKMB, CKMBINDEX, TROPONINI in the last 168 hours. BNP (last 3 results) No results for input(s): PROBNP in the last 8760 hours. HbA1C: No results for input(s): HGBA1C in the last 72 hours. CBG: No results for input(s): GLUCAP in the last 168 hours. Lipid Profile: No results for input(s): CHOL, HDL, LDLCALC, TRIG, CHOLHDL, LDLDIRECT in the last 72 hours. Thyroid Function Tests: No results for input(s): TSH, T4TOTAL, FREET4, T3FREE, THYROIDAB in the last 72 hours. Anemia Panel: No results for input(s): VITAMINB12, FOLATE, FERRITIN, TIBC, IRON, RETICCTPCT in the last 72 hours. Urine analysis:    Component Value Date/Time   COLORURINE YELLOW 09/07/2015 1226   APPEARANCEUR TURBID (A) 09/07/2015 1226   LABSPEC 1.020 09/07/2015 1226   PHURINE 5.0 09/07/2015 1226   GLUCOSEU 100 (A) 09/07/2015 1226   HGBUR LARGE (A) 09/07/2015 1226   BILIRUBINUR NEGATIVE 09/07/2015 1226   KETONESUR NEGATIVE 09/07/2015 1226   PROTEINUR 30 (A) 09/07/2015 1226   UROBILINOGEN 0.2 06/22/2010 1226   NITRITE NEGATIVE 09/07/2015 1226   LEUKOCYTESUR LARGE (A) 09/07/2015 1226   Sepsis Labs: @LABRCNTIP (procalcitonin:4,lacticidven:4) ) Recent Results (from the past 240 hour(s))  Culture, blood (Routine x 2)     Status: None (Preliminary result)   Collection Time: 09/07/15  1:20 PM  Result Value Ref Range Status   Specimen Description BLOOD RIGHT FOREARM  Final   Special Requests IN PEDIATRIC BOTTLE  3CC   Final   Culture PENDING  Incomplete   Report Status PENDING  Incomplete  Culture, blood (Routine x 2)     Status: None (Preliminary result)   Collection Time: 09/07/15  1:38 PM  Result Value Ref Range Status   Specimen Description BLOOD LEFT HAND  Final   Special Requests IN PEDIATRIC BOTTLE  1CC  Final   Culture PENDING  Incomplete   Report Status PENDING  Incomplete     Radiological Exams on Admission: Dg Chest Portable 1 View  Result Date: 09/07/2015 CLINICAL DATA:  Altered mental status EXAM: PORTABLE CHEST 1 VIEW COMPARISON:  August 22, 2015 FINDINGS: There is no edema or consolidation. Heart is mildly enlarged with pulmonary vascularity within normal limits. No adenopathy. No bone lesions. IMPRESSION: Mild cardiac enlargement.  No edema or consolidation. Electronically Signed   By: Lowella Grip III M.D.   On: 09/07/2015 13:14    EKG: (Independently reviewed) Sinus rhythm with voltage criteria consistent with LVH, ventricular rate 65 bpm, QTC 445 ms, no ischemic changes and no changes from previous EKG  Assessment/Plan Principal Problem:   AKI (acute kidney injury) on CKD (chronic kidney disease), stage III -Patient presents with infection symptoms and metabolic encephalopathy in setting of UTI and acute kidney injury with increase in baseline creatinine from 1.34-2.2 with doubling of the BUN from 30 to 63 therefore suspect a degree of uremia contributed to patient's altered mentation -Because of multiple comorbidities including recent stroke, underlying diabetes, underlying chronic kidney disease and recent there to thrive patient has been made inpatient status with an expected more than 2 midnight stay -Given 1 L of fluids in ER and will continue gentle IV fluid hydration -Hold preadmission antihypertensive medication carvedilol and do not replace with IV substitute -Hold losartan with hydrochlorothiazide -Follow labs  Active Problems:   Dehydration -Seems to be in acute on  chronic issue: Chronic related to failure to thrive symptoms and acute related to UTI and likely further decrease in appetite and oral intake -IV fluid as above    UTI (lower urinary tract infection) -Reported as being diagnosed with UTI several days ago and having been on Rocephin for several days but medication records from nursing facility unclear -At this juncture must suppose that current UTI symptomatology potentially resistant to Rocephin so we'll continue Cipro initiated by ER -Follow up on urine culture and blood cultures obtained in ER    Metabolic encephalopathy -Seems to have a chronic component related to recent stroke and further worsened by acute kidney injuries azotemia and apparent underlying UTI -Treat underlying causes that are easily reversible (AKI and UTI) -NPO until more alert and after SLP evaluation (see below)    COPD (chronic obstructive pulmonary disease)  -Currently not wheezing on exam -Continue albuterol MDI PRN    Diabetes mellitus, controlled  -On Tradjenta prior to admission-Will hold since NPO in favor of SSRI -Hemoglobin A1c was 8.4 on 7/15    Thalamic infarction (June 2017 w/ residual dysarthria/FTT) -Patient has continued to decline since her stroke despite admission to skilled nursing facility for additional therapies -Patient followed up at her neurologist Dr. Cathren Laine office on 8/15 at which point it was discussed with the patient and? Her family that she may need a feeding tube if she continues to refuse to drink or eat -if she becomes more lethargic it was recommended the patient is back to the ER -SLP evaluation prior to allowing diet-during previous admission D2 diet was recommended    Recent NSTEMI (non-ST elevated myocardial infarction-July 2017)  -NPO so preadmission statin, beta blocker, aspirin and Plavix on hold    Hyperlipidemia -NPO so statin on hold    Essential hypertension -Current blood pressures well controlled    Right  Adrenal nodule  -Seen on previous imaging with radiology's recommendation for MRI to better characterize once renal function has recovered      DVT prophylaxis: Lovenox dose adjusted for low creatinine clearance  Code Status: Listed as DO NOT RESUSCITATE on previous 2 admissions but documentation from nursing home had a hand written notification of "full code" in red ink at top of  paperwork therefore ordered as FULL CODE Family Communication: No family at bedside at time of admission  Disposition Plan: Anticipate will return to skilled nursing facility once medically stable  Consults called: None  Admission status: Inpatient/floor     ELLIS,ALLISON L. ANP-BC [facility] Pager [phone]   If 7PM-7AM, please contact night-coverage [email or web address] Password [email or web address]  09/07/2015, 2:55 PM

## 2015-09-07 NOTE — ED Notes (Signed)
Only able to collect 1 set of blood cultures.

## 2015-09-07 NOTE — Progress Notes (Signed)
CRITICAL VALUE ALERT  Critical value received:  Lactic Acid  Date of notification: 09/07/2015  Time of notification: 2230  Critical value read back: yes  Nurse who received alert:  Jari Sportsman, RN  MD notified (1st page):  L Easterwood  Time of first page:  2300  MD notified (2nd page): L Easterwood  Time of second page: 2300  Responding MD:    Time MD responded:

## 2015-09-07 NOTE — ED Provider Notes (Signed)
[facility] DEPT Provider Note   CSN: [id number] Arrival date & time: 09/07/15  1158  By signing my name below, I, Paula West, attest that this documentation has been prepared under the direction and in the presence of Paula Manifold, MD. Electronically Signed: Hansel West, ED Scribe. 09/07/15. 12:27 PM.     History   Chief Complaint Chief Complaint  Patient presents with  . Altered Mental Status   LEVEL 5 CAVEAT: HPI and ROS limited due to AMS   HPI Paula West is a 82 y.o. female brought in by ambulance who presents to the Emergency Department d/t increased AMS from baseline onset 3 days ago. Per facility, the pt has been more lethargic and unable to feed herself in the last few days. Pt is nonverbal at baseline, but does usually feed herself. She is currently being treated for UTI with IV rocephin. Pt lives at [facility] SNF.   Pt was transferred from [facility], who reported a temperature of 100.3 today. BUN and Creatinine were also elevated.   The history is provided by the EMS personnel and the nursing home. History limited by: AMS. No language interpreter was used.    Past Medical History:  Diagnosis Date  . Asthma   . CKD (chronic kidney disease), stage II   . COPD (chronic obstructive pulmonary disease) ([place])   . Diabetes mellitus   . Hyperlipidemia   . Hypertension     Patient Active Problem List   Diagnosis Date Noted  . AKI (acute kidney injury) ([place]) 08/22/2015  . Acute renal failure superimposed on stage 3 chronic kidney disease ([place]) 08/22/2015  . Delirium 08/22/2015  . Esophageal reflux   . Chest pain 08/02/2015  . Abdominal pain, epigastric 08/02/2015  . Abdominal aortic atherosclerosis ([place]) 08/02/2015  . Elevated troponin   . Chronic obstructive pulmonary disease ([place])   . Thalamic infarction ([place]) 07/12/2015  . Type 2 diabetes mellitus with hyperglycemia, with long-term current use of insulin ([place]) 07/12/2015  . CKD (chronic  kidney disease), stage III 07/12/2015  . Hypercalcemia 07/12/2015  . Asthma   . COPD (chronic obstructive pulmonary disease) ([place])   . Hypertensive heart disease without CHF   . Hyperlipidemia   . Diabetes ([place])   . CKD (chronic kidney disease), stage II   . Essential hypertension     History reviewed. No pertinent surgical history.  OB History    No data available       Home Medications    Prior to Admission medications   Medication Sig Start Date End Date Taking? Authorizing Provider  ACCU-CHEK AVIVA PLUS test strip As directed 07/02/15   Historical Provider, MD  ACCU-CHEK SOFTCLIX LANCETS lancets As directed 07/02/15   Historical Provider, MD  albuterol (PROAIR HFA) 108 (90 Base) MCG/ACT inhaler Inhale 2 puffs into the lungs every 6 (six) hours as needed for wheezing or shortness of breath.    Historical Provider, MD  aspirin 81 MG tablet Take 1 tablet (81 mg total) by mouth daily. 07/12/15   [name], MD  atorvastatin (LIPITOR) 40 MG tablet Take 1 tablet (40 mg total) by mouth daily at 6 PM. 07/13/15   [name], MD  carvedilol (COREG) 12.5 MG tablet Take 1 tablet (12.5 mg total) by mouth 2 (two) times daily with a meal. 08/05/15   [name], MD  clopidogrel (PLAVIX) 75 MG tablet Take 1 tablet (75 mg total) by mouth daily. 08/05/15   [name], MD  colchicine (COLCRYS) 0.6 MG  tablet Take 0.6 mg by mouth 2 (two) times daily as needed (for gout symptoms).    Historical Provider, MD  insulin glargine (LANTUS) 100 UNIT/ML injection Inject 50-60 Units into the skin 2 (two) times daily. 60 units in the morning and 50 units at bedtime depending on BGL    Historical Provider, MD  linagliptin (TRADJENTA) 5 MG TABS tablet Take 5 mg by mouth daily.    Historical Provider, MD  olmesartan-hydrochlorothiazide (BENICAR HCT) 40-25 MG per tablet Take 1 tablet by mouth daily.      Historical Provider, MD  omeprazole (PRILOSEC) 20 MG capsule Take 2 capsules (40 mg total) by mouth 2 (two) times  daily before a meal. Patient taking differently: Take 20 mg by mouth 2 (two) times daily before a meal. AS NEEDED 08/05/15   [name], MD  prednisoLONE acetate (PRED FORTE) 1 % ophthalmic suspension Place 1 drop into both eyes 4 (four) times daily. 08/12/15   Historical Provider, MD  ranitidine (ZANTAC) 300 MG tablet Take 1 tablet (300 mg total) by mouth at bedtime. 08/05/15   [name], MD  sucralfate (CARAFATE) 1 GM/10ML suspension Take 10 mLs (1 g total) by mouth 3 (three) times daily. 08/05/15   [name], MD  trimethoprim-polymyxin b (POLYTRIM) ophthalmic solution INSTILL 1 DROP IN LEFT EYE 4 TIMES DAILY FOR 2 DAYS AFTER EACH MONTHLY EYE INJECTION 06/23/15   Historical Provider, MD    Family History Family History  Problem Relation Age of Onset  . Diabetes Sister   . Stroke Mother   . CAD Neg Hx     Social History Social History  Substance Use Topics  . Smoking status: Former Research scientist (life sciences)  . Smokeless tobacco: Former Systems developer  . Alcohol use No     Allergies   Codeine; Darvon [propoxyphene]; and Penicillins   Review of Systems Review of Systems  Unable to perform ROS: Mental status change     Physical Exam Updated Vital Signs Ht 5\' 4"  (1.626 m)   Wt 165 lb (74.8 kg)   SpO2 98%   BMI 28.32 kg/m   Physical Exam  Constitutional: She appears well-developed.  HENT:  Head: Normocephalic.  Mouth/Throat: Oropharynx is clear and moist. No oropharyngeal exudate.  Eyes: Conjunctivae are normal. No scleral icterus.  Neck: Normal range of motion. Neck supple. No JVD present. No thyromegaly present.  Cardiovascular: Normal rate, regular rhythm, normal heart sounds and intact distal pulses.  Exam reveals no gallop and no friction rub.   No murmur heard. Pulmonary/Chest: Effort normal and breath sounds normal. No respiratory distress. She has no wheezes. She has no rales.  Abdominal: Soft. Bowel sounds are normal. She exhibits no distension and no mass. There is tenderness.  There is guarding. There is no rebound.  Diffuse abdominal tenderness with voluntary guarding. No rebound. Multiple surgical scars to abdomen.   Musculoskeletal: She exhibits no edema or tenderness.  Lymphadenopathy:    She has no cervical adenopathy.  Neurological: She is alert.  Skin: Skin is warm and dry. No rash noted. No erythema.  Nursing note and vitals reviewed.    ED Treatments / Results  Labs (all labs ordered are listed, but only abnormal results are displayed) Labs Reviewed  COMPREHENSIVE METABOLIC PANEL - Abnormal; Notable for the following:       Result Value   CO2 18 (*)    Glucose, Bld 197 (*)    BUN 63 (*)    Creatinine, Ser 2.02 (*)    Albumin 2.9 (*)  GFR calc non Af Amer 22 (*)    GFR calc Af Amer 26 (*)    All other components within normal limits  CBC WITH DIFFERENTIAL/PLATELET - Abnormal; Notable for the following:    RBC 5.13 (*)    RDW 16.6 (*)    Platelets 130 (*)    All other components within normal limits  URINALYSIS, ROUTINE W REFLEX MICROSCOPIC (NOT AT [facility]) - Abnormal; Notable for the following:    APPearance TURBID (*)    Glucose, UA 100 (*)    Hgb urine dipstick LARGE (*)    Protein, ur 30 (*)    Leukocytes, UA LARGE (*)    All other components within normal limits  URINE MICROSCOPIC-ADD ON - Abnormal; Notable for the following:    Squamous Epithelial / LPF 6-30 (*)    Bacteria, UA MANY (*)    All other components within normal limits  I-STAT CG4 LACTIC ACID, ED - Abnormal; Notable for the following:    Lactic Acid, Venous 1.91 (*)    All other components within normal limits  CULTURE, BLOOD (ROUTINE X 2)  CULTURE, BLOOD (ROUTINE X 2)  URINE CULTURE    EKG  EKG Interpretation  Date/Time:  Sunday September 07 2015 12:10:15 EDT Ventricular Rate:  65 PR Interval:    QRS Duration: 108 QT Interval:  428 QTC Calculation: 445 R Axis:   -66 Text Interpretation:  Ectopic atrial rhythm LAD, consider LAFB or inferior infarct LVH with  secondary repolarization abnormality Anterior infarct, old Confirmed by Wilson Singer  MD, Twania Bujak ([id number]) on 09/07/2015 12:53:12 PM       Radiology Dg Chest Portable 1 View  Result Date: 09/07/2015 CLINICAL DATA:  Altered mental status EXAM: PORTABLE CHEST 1 VIEW COMPARISON:  August 22, 2015 FINDINGS: There is no edema or consolidation. Heart is mildly enlarged with pulmonary vascularity within normal limits. No adenopathy. No bone lesions. IMPRESSION: Mild cardiac enlargement.  No edema or consolidation. Electronically Signed   By: Lowella Grip III M.D.   On: 09/07/2015 13:14    Procedures Procedures (including critical care time)  DIAGNOSTIC STUDIES: Oxygen Saturation is 98% on RA, normal by my interpretation.    COORDINATION OF CARE: 12:15 PM Discussed treatment plan with pt at bedside which includes lab work and pt agreed to plan.    Medications Ordered in ED Medications - No data to display   Initial Impression / Assessment and Plan / ED Course  I have reviewed the triage vital signs and the nursing notes.  Pertinent labs & imaging results that were available during my care of the patient were reviewed by me and considered in my medical decision making (see chart for details).  Clinical Course    82yF with AMS. Apparently getting rocephin via RUE IV at [facility].  I'm not sure of exact duration of treatment at this point or lab results/culture data. Will try to get records from facility. UA today still consistent with UTI. Will change abx class. Urine culture. Again with dehydration/AKI. BUN in 60s with Cr of 2. Discharged with Cr of 1.34. IVF. Pt has had prior CVA with significant deficits. At her baseline she apparently could use a walker and scoot in a wheelchair. Reportedly, for the past few days she has been unable to even feed herself.   Final Clinical Impressions(s) / ED Diagnoses   Final diagnoses:  UTI (lower urinary tract infection)  Altered mental  status, unspecified altered mental status type  Dehydration  AKI (  acute kidney injury) [facility])    New Prescriptions New Prescriptions   No medications on file    I personally preformed the services scribed in my presence. The recorded information has been reviewed is accurate. Paula Manifold, MD.    Paula Manifold, MD 09/07/15 [phone]

## 2015-09-07 NOTE — ED Notes (Signed)
X-ray at bedside

## 2015-09-08 DIAGNOSIS — E1165 Type 2 diabetes mellitus with hyperglycemia: Secondary | ICD-10-CM

## 2015-09-08 DIAGNOSIS — N390 Urinary tract infection, site not specified: Secondary | ICD-10-CM

## 2015-09-08 DIAGNOSIS — G9341 Metabolic encephalopathy: Secondary | ICD-10-CM

## 2015-09-08 DIAGNOSIS — E785 Hyperlipidemia, unspecified: Secondary | ICD-10-CM

## 2015-09-08 LAB — CBC
HEMATOCRIT: 43.3 % (ref 36.0–46.0)
HEMOGLOBIN: 14.1 g/dL (ref 12.0–15.0)
MCH: 26.1 pg (ref 26.0–34.0)
MCHC: 32.6 g/dL (ref 30.0–36.0)
MCV: 80 fL (ref 78.0–100.0)
Platelets: 121 10*3/uL — ABNORMAL LOW (ref 150–400)
RBC: 5.41 MIL/uL — ABNORMAL HIGH (ref 3.87–5.11)
RDW: 16.7 % — ABNORMAL HIGH (ref 11.5–15.5)
WBC: 9 10*3/uL (ref 4.0–10.5)

## 2015-09-08 LAB — BASIC METABOLIC PANEL
Anion gap: 12 (ref 5–15)
BUN: 45 mg/dL — AB (ref 6–20)
CHLORIDE: 113 mmol/L — AB (ref 101–111)
CO2: 17 mmol/L — AB (ref 22–32)
CREATININE: 1.61 mg/dL — AB (ref 0.44–1.00)
Calcium: 9 mg/dL (ref 8.9–10.3)
GFR calc Af Amer: 34 mL/min — ABNORMAL LOW (ref >60)
GFR calc non Af Amer: 29 mL/min — ABNORMAL LOW (ref >60)
Glucose, Bld: 109 mg/dL — ABNORMAL HIGH (ref 65–99)
Potassium: 3.7 mmol/L (ref 3.5–5.1)
Sodium: 142 mmol/L (ref 135–145)

## 2015-09-08 LAB — GLUCOSE, CAPILLARY
GLUCOSE-CAPILLARY: 108 mg/dL — AB (ref 65–99)
GLUCOSE-CAPILLARY: 94 mg/dL (ref 65–99)
GLUCOSE-CAPILLARY: 111 mg/dL — AB (ref 65–99)
GLUCOSE-CAPILLARY: 121 mg/dL — AB (ref 65–99)
GLUCOSE-CAPILLARY: 115 mg/dL — AB (ref 65–99)
Glucose-Capillary: 107 mg/dL — ABNORMAL HIGH (ref 65–99)
Glucose-Capillary: 96 mg/dL (ref 65–99)

## 2015-09-08 LAB — LACTIC ACID, PLASMA: Lactic Acid, Venous: 2.3 mmol/L (ref 0.5–1.9)

## 2015-09-08 NOTE — Progress Notes (Addendum)
Progress Note    Paula West  [id number] DOB: 07/22/1932  DOA: 09/07/2015 PCP: No PCP Per Patient    Brief Narrative:   Paula West is an 82 y.o. female with recent CVA few weeks ago with residual right sided weakness, DM, CKD 3, COPD, poorly communicative at baseline who was admitted 09/07/15 from SNF due to decreased responsiveness and lethargy. She had a fever few days ago and was started on antibiotics at [facility] for a UTI. Upon initial presentation, the patient was found to have acute kidney injury with a creatinine of 2.  Assessment/Plan:   Principal Problems:   AKI (acute kidney injury) secondary to dehydration, in the setting of stage III CKD resulting in metabolic encephalopathy Patient presented with infection symptoms and metabolic encephalopathy in setting of UTI and acute kidney injury with increase in baseline creatinine from 1.34-2.2 with doubling of the BUN from 30 to 63, which was felt to be the reason for her altered mental status. She was hydrated and her losartan/HCTZ were held. Creatinine has improved to 1.61.    UTI (lower urinary tract infection) Was treated with Rocephin as an outpatient, however antibiotics switched to Cipro due to concerns that the organism was resistant given her ongoing symptoms and lack of improvement. Follow-up cultures.  Active Problems:   Metabolic encephalopathy As above. NPO until more alert and after SLP evaluation (see below).    COPD (chronic obstructive pulmonary disease)  Continue bronchodilators as needed.    Diabetes mellitus, controlled  On Tradjenta prior to admission, which is currently on hold. Currently being managed with SSI. Hemoglobin A1c was 8.4 on 7/15    Thalamic infarction (June 2017 w/ residual dysarthria/FTT) Patient has continued to decline since her stroke despite admission to skilled nursing facility for additional therapies. She followed up at her neurologist Dr. Cathren Laine office on 09/02/15 at  which point it was discussed with the patient and? Her family that she may need a feeding tube if she continues to refuse to drink or eat. During previous admission D2 diet was recommended. Speech therapist unable to assessed today secondary to her inability to follow commands with ongoing recommendations for nothing by mouth status. Will get palliative care to see the patient given that she is a full code and will likely need a feeding tube if aggressive care continues to be desired by the patient's family.    Recent NSTEMI (non-ST elevated myocardial infarction-July 2017)  NPO so preadmission statin, beta blocker, aspirin and Plavix on hold.    Hyperlipidemia NPO so statin on hold.    Essential hypertension Current blood pressures well controlled.    Right Adrenal nodule  Seen on previous imaging with radiology's recommendation for MRI to better characterize once renal function has recovered.    Family Communication/Anticipated D/C date and plan/Code Status   DVT prophylaxis: Lovenox ordered. Code Status: Full Code.  Family Communication: No family at the bedside. Spoke with daughter, Paula West at [phone], seems to have low healthcare literacy but was agreeable to speak with the palliative care team. Disposition Plan: From a SNF. Likely will return there.   Medical Consultants:    Palliative Care   Procedures:    None  Anti-Infectives:   Cipro 09/07/15--->  Subjective:    Paula West is nonverbal and curled up in a fetal position.  Objective:    Vitals:   09/07/15 1430 09/07/15 2038 09/08/15 0008 09/08/15 0352  BP: 133/63 (!) 143/65 140/82 (!) 143/63  Pulse: Marland Kitchen)  59 62 66 64  Resp: 23     Temp:  97.8 F (36.6 C) 98.4 F (36.9 C) 98.3 F (36.8 C)  TempSrc:  Axillary Axillary Axillary  SpO2: 95% 100% 96% 95%  Weight:      Height:        Intake/Output Summary (Last 24 hours) at 09/08/15 1056 Last data filed at 09/08/15 0700  Gross per  24 hour  Intake          1511.67 ml  Output                0 ml  Net          1511.67 ml   Filed Weights   09/07/15 1203  Weight: 74.8 kg (165 lb)    Exam: General exam: Nonverbal, curled up in a fetal position. Respiratory system: Clear to auscultation. Respiratory effort normal. Cardiovascular system: S1 & S2 heard, RRR. No JVD,  rubs, gallops or clicks. No murmurs. Gastrointestinal system: Abdomen is nondistended, soft and nontender. No organomegaly or masses felt. Normal bowel sounds heard. Central nervous system: Alert, nonverbal. Extremities: No clubbing,  or cyanosis. No edema. Skin: No rashes, lesions or ulcers. Psychiatry: Judgement and insight appear normal. Mood & affect appropriate.   Data Reviewed:   I have personally reviewed following labs and imaging studies:  Labs: Basic Metabolic Panel:  Recent Labs Lab 09/07/15 1320 09/07/15 1918 09/08/15 0540  NA 138  --  142  K 3.8  --  3.7  CL 111  --  113*  CO2 18*  --  17*  GLUCOSE 197*  --  109*  BUN 63*  --  45*  CREATININE 2.02*  --  1.61*  CALCIUM 9.0  --  9.0  MG  --  2.0  --   PHOS  --  2.2*  --    GFR Estimated Creatinine Clearance: 27.6 mL/min (by C-G formula based on SCr of 1.61 mg/dL). Liver Function Tests:  Recent Labs Lab 09/07/15 1320  AST 22  ALT 25  ALKPHOS 113  BILITOT 0.7  PROT 6.6  ALBUMIN 2.9*   No results for input(s): LIPASE, AMYLASE in the last 168 hours. No results for input(s): AMMONIA in the last 168 hours. Coagulation profile No results for input(s): INR, PROTIME in the last 168 hours.  CBC:  Recent Labs Lab 09/07/15 1320 09/08/15 0540  WBC 8.8 9.0  NEUTROABS 6.0  --   HGB 13.5 14.1  HCT 41.4 43.3  MCV 80.7 80.0  PLT 130* 121*   Cardiac Enzymes: No results for input(s): CKTOTAL, CKMB, CKMBINDEX, TROPONINI in the last 168 hours. BNP (last 3 results) No results for input(s): PROBNP in the last 8760 hours. CBG:  Recent Labs Lab 09/07/15 1512  09/07/15 2050 09/08/15 0038 09/08/15 0408 09/08/15 0800  GLUCAP 152* 116* 107* 108* 94   D-Dimer: No results for input(s): DDIMER in the last 72 hours. Hgb A1c: No results for input(s): HGBA1C in the last 72 hours. Lipid Profile: No results for input(s): CHOL, HDL, LDLCALC, TRIG, CHOLHDL, LDLDIRECT in the last 72 hours. Thyroid function studies: No results for input(s): TSH, T4TOTAL, T3FREE, THYROIDAB in the last 72 hours.  Invalid input(s): FREET3 Anemia work up: No results for input(s): VITAMINB12, FOLATE, FERRITIN, TIBC, IRON, RETICCTPCT in the last 72 hours. Sepsis Labs:  Recent Labs Lab 09/07/15 1259 09/07/15 1320 09/07/15 1917 09/07/15 1918 09/07/15 2205 09/08/15 0540  PROCALCITON  --   --   --  0.10  --   --  WBC  --  8.8  --   --   --  9.0  LATICACIDVEN 1.91*  --  2.7*  --  2.4* 2.3*    Microbiology Recent Results (from the past 240 hour(s))  Culture, blood (Routine x 2)     Status: None (Preliminary result)   Collection Time: 09/07/15  1:20 PM  Result Value Ref Range Status   Specimen Description BLOOD RIGHT FOREARM  Final   Special Requests IN PEDIATRIC BOTTLE  3CC  Final   Culture PENDING  Incomplete   Report Status PENDING  Incomplete  Culture, blood (Routine x 2)     Status: None (Preliminary result)   Collection Time: 09/07/15  1:38 PM  Result Value Ref Range Status   Specimen Description BLOOD LEFT HAND  Final   Special Requests IN PEDIATRIC BOTTLE  1CC  Final   Culture PENDING  Incomplete   Report Status PENDING  Incomplete  MRSA PCR Screening     Status: None   Collection Time: 09/07/15  6:12 PM  Result Value Ref Range Status   MRSA by PCR NEGATIVE NEGATIVE Final    Comment:        The GeneXpert MRSA Assay (FDA approved for NASAL specimens only), is one component of a comprehensive MRSA colonization surveillance program. It is not intended to diagnose MRSA infection nor to guide or monitor treatment for MRSA infections.      Radiology: Dg Chest Portable 1 View  Result Date: 09/07/2015 CLINICAL DATA:  Altered mental status EXAM: PORTABLE CHEST 1 VIEW COMPARISON:  August 22, 2015 FINDINGS: There is no edema or consolidation. Heart is mildly enlarged with pulmonary vascularity within normal limits. No adenopathy. No bone lesions. IMPRESSION: Mild cardiac enlargement.  No edema or consolidation. Electronically Signed   By: Lowella Grip III M.D.   On: 09/07/2015 13:14    Medications:   . ciprofloxacin  400 mg Intravenous Q24H  . enoxaparin (LOVENOX) injection  30 mg Subcutaneous Q24H  . insulin aspart  0-9 Units Subcutaneous Q4H  . prednisoLONE acetate  1 drop Both Eyes QID   Continuous Infusions: . sodium chloride 100 mL/hr at 09/08/15 0946    Time spent: 25 minutes.   LOS: 1 day   Jarae Panas  [facility] Pager [phone]. If unable to reach me by pager, please call my cell phone at [phone].  *Please refer to [email or web address], password [email or web address] to get updated schedule on who will round on this patient, as hospitalists switch teams weekly. If 7PM-7AM, please contact night-coverage at [email or web address], password [email or web address] for any overnight needs.  09/08/2015, 10:56 AM

## 2015-09-08 NOTE — Progress Notes (Signed)
CRITICAL VALUE ALERT  Critical value received: Lactic Acid 2.3  Date of notification: 8/21  Time of notification:  0615  Critical value read back: yes  Nurse who received alert:  Jari Sportsman, RN  MD notified (1st page): L. Easterman  Time of first page:  0630  MD notified (2nd page):   Time of second page:  Responding MD:   Time MD responded:

## 2015-09-08 NOTE — Progress Notes (Signed)
Palliative Medicine consult noted. Due to high referral volume, there may be a delay seeing this patient. Please call the Palliative Medicine Team office at [phone] if recommendations are needed in the interim.  Thank you for inviting [place] to see this patient.  Marjie Skiff Yan Okray, RN, BSN, [facility] 09/08/2015 4:10 PM Cell [phone] 8:00-4:00 Monday-Friday Office [phone]

## 2015-09-08 NOTE — Evaluation (Signed)
Clinical/Bedside Swallow Evaluation Patient Details  Name: Ralene Colgrove MRN: [id number] Date of Birth: [birth year]  Today's Date: 09/08/2015 Time: SLP Start Time (ACUTE ONLY): [id number] SLP Stop Time (ACUTE ONLY): 0831 SLP Time Calculation (min) (ACUTE ONLY): 19 min  Past Medical History:  Past Medical History:  Diagnosis Date  . Asthma   . CKD (chronic kidney disease), stage II   . COPD (chronic obstructive pulmonary disease) ([place])   . Diabetes mellitus   . Hyperlipidemia   . Hypertension    Past Surgical History: History reviewed. No pertinent surgical history. HPI:  Anansa Pruiett a 82 y.o.femalewith medical history significant for diabetes on oral agents, stage III chronic kidney disease, COPD, hypertension, dyslipidemia and recent thalamic stroke in June 2017 with residual dysarthria. Patient discharged to a skilled nursing facility. Since her stroke she has been admitted to the hospital twice:The first in mid July for chest pain and non-STEMI with recommendations to continue medical therapy upon discharge; the second was in early August for acute kidney injury secondary to dehydration from failure to thrive. At that time an incidental 3 cm nodule in the right adrenal gland was noted with recommendations from the radiology to obtain MRI once kidney function documented stable.     Assessment / Plan / Recommendation Clinical Impression  Clinical swallowing evaluation was completed utilizing ice chips and 1/2 tsp bolus of pureed material.  Of note, the patient had a clinical swallowing exam dated 08/23/2015 with recommendation for a dysphagia 2 diet with thin liquids using aspiration precautions.  Oral holding and expectoration of material was seen on this exam.  Today oral mechanism exam was attempted and not completed as the patient was unable to follow any commands.  The patient presented with oral and pharyngeal dysphagia characterized by delayed oral transit, oral holding,  decreased bolus awareness and suspected delay in swallow trigger when pharyngeal swallow was observed.  Throat clearing was seen following ice chip boluses.  The patient never moved the pureed material from her oral cavity and the material was eventually removed.  Given the patient's current status  and questionable aspiration given throat clearing that was seen post ice chip boluses recommend that the patient remain NPO pending re evaluation of swallowing next date in the hopes that her mentation will improve and she will demonstrate readiness for PO's.      Aspiration Risk  Moderate aspiration risk    Diet Recommendation   NPO  Medication Administration: Via alternative means    Other  Recommendations Oral Care Recommendations: Oral care QID   Follow up Recommendations  Skilled Nursing facility    Frequency and Duration min 2x/week  2 weeks       Prognosis Prognosis for Safe Diet Advancement: Fair Barriers to Reach Goals: Cognitive deficits      Swallow Study   General Date of Onset: 09/07/15 HPI: Chrisha Burford a 82 y.o.femalewith medical history significant for diabetes on oral agents, stage III chronic kidney disease, COPD, hypertension, dyslipidemia and recent thalamic stroke in June 2017 with residual dysarthria. Patient discharged to a skilled nursing facility. Since her stroke she has been admitted to the hospital twice:The first in mid July for chest pain and non-STEMI with recommendations to continue medical therapy upon discharge; the second was in early August for acute kidney injury secondary to dehydration from failure to thrive. At that time an incidental 3 cm nodule in the right adrenal gland was noted with recommendations from the radiology to obtain MRI once kidney function documented  stable.   Type of Study: Bedside Swallow Evaluation Previous Swallow Assessment: 08/23/2015 with recommendation for a dysphagia 2 diet with thin liquids.  Small bites/sips, no  straws.   Diet Prior to this Study: NPO Temperature Spikes Noted: No Respiratory Status: Room air History of Recent Intubation: No Behavior/Cognition: Confused;Doesn't follow directions Oral Cavity Assessment:  (unable to visualize) Oral Care Completed by SLP: Yes Oral Cavity - Dentition: Missing dentition Self-Feeding Abilities: Total assist Patient Positioning: Upright in bed Baseline Vocal Quality: Not observed Volitional Cough: Cognitively unable to elicit Volitional Swallow: Unable to elicit    Oral/Motor/Sensory Function Overall Oral Motor/Sensory Function: Other (comment) (Unable to assess as pt unable to follow any commands)   Ice Chips Ice chips: Impaired Presentation: Spoon Oral Phase Impairments: Impaired mastication;Poor awareness of bolus Oral Phase Functional Implications: Prolonged oral transit Pharyngeal Phase Impairments: Suspected delayed Swallow   Thin Liquid Thin Liquid: Not tested    Nectar Thick Nectar Thick Liquid: Not tested   Honey Thick Honey Thick Liquid: Not tested   Puree Puree: Impaired Presentation: Spoon Oral Phase Impairments: Poor awareness of bolus Oral Phase Functional Implications: Oral holding (Material was removed from oral cavity) Pharyngeal Phase Impairments: Other (comments) (Unable to assess as pharyngeal swallow not observed.  )   Solid   GO   Solid: Not tested    Functional Assessment Tool Used: ASHA NOMS Functional Limitations: Swallowing Swallow Current Status [id number]): 100 percent impaired, limited or restricted Swallow Goal Status [id number]): At least 40 percent but less than 60 percent impaired, limited or restricted  Shelly Flatten, MA, [facility] 09/08/2015,8:46 AM

## 2015-09-09 LAB — BASIC METABOLIC PANEL
ANION GAP: 10 (ref 5–15)
BUN: 26 mg/dL — ABNORMAL HIGH (ref 6–20)
CALCIUM: 8.6 mg/dL — AB (ref 8.9–10.3)
CHLORIDE: 114 mmol/L — AB (ref 101–111)
CO2: 18 mmol/L — AB (ref 22–32)
Creatinine, Ser: 1.3 mg/dL — ABNORMAL HIGH (ref 0.44–1.00)
GFR calc Af Amer: 44 mL/min — ABNORMAL LOW (ref >60)
GFR calc non Af Amer: 38 mL/min — ABNORMAL LOW (ref >60)
GLUCOSE: 113 mg/dL — AB (ref 65–99)
POTASSIUM: 3.3 mmol/L — AB (ref 3.5–5.1)
Sodium: 142 mmol/L (ref 135–145)

## 2015-09-09 LAB — GLUCOSE, CAPILLARY
GLUCOSE-CAPILLARY: 126 mg/dL — AB (ref 65–99)
GLUCOSE-CAPILLARY: 125 mg/dL — AB (ref 65–99)
GLUCOSE-CAPILLARY: 111 mg/dL — AB (ref 65–99)
Glucose-Capillary: 109 mg/dL — ABNORMAL HIGH (ref 65–99)
Glucose-Capillary: 110 mg/dL — ABNORMAL HIGH (ref 65–99)
Glucose-Capillary: 123 mg/dL — ABNORMAL HIGH (ref 65–99)
Glucose-Capillary: 129 mg/dL — ABNORMAL HIGH (ref 65–99)

## 2015-09-09 LAB — URINE CULTURE: Culture: 50000 — AB

## 2015-09-09 LAB — PROCALCITONIN: Procalcitonin: <0.1 ng/mL

## 2015-09-09 NOTE — Progress Notes (Signed)
Progress Note    Paula West  [id number] DOB: October 31, 1932  DOA: 09/07/2015 PCP: No PCP Per Patient    Brief Narrative:   Paula West is an 82 y.o. female with recent CVA few weeks ago with residual right sided weakness, DM, CKD 3, COPD, poorly communicative at baseline who was admitted 09/07/15 from SNF due to decreased responsiveness and lethargy. She had a fever few days ago and was started on antibiotics at [facility] for a UTI. Upon initial presentation, the patient was found to have acute kidney injury with a creatinine of 2.  Assessment/Plan:   Principal Problems:   AKI (acute kidney injury) secondary to dehydration, in the setting of stage III CKD resulting in metabolic encephalopathy Patient presented with infection symptoms and metabolic encephalopathy in setting of UTI and acute kidney injury with increase in baseline creatinine from 1.34-2.2 with doubling of the BUN from 30 to 63, which was felt to be the reason for her altered mental status. She was hydrated and her losartan/HCTZ were held. Creatinine has improved to near baseline at 1.3.     UTI (lower urinary tract infection) Was treated with Rocephin as an outpatient, however antibiotics switched to Cipro due to concerns that the organism was resistant given her ongoing symptoms and lack of improvement. CUltures show Pansensitive Escherichia coli. Continue Cipro.  Active Problems:   Metabolic encephalopathy As above.      COPD (chronic obstructive pulmonary disease)  Continue bronchodilators as needed.    Diabetes mellitus, controlled  On Tradjenta prior to admission, which is currently on hold. Currently being managed with SSI. Hemoglobin A1c was 8.4 on 7/15    Thalamic infarction (June 2017 w/ residual dysarthria/FTT) Patient has continued to decline since her stroke despite admission to skilled nursing facility for additional therapies. She followed up at her neurologist Dr. Cathren Laine office on 09/02/15 at  which point it was discussed with the patient and? Her family that she may need a feeding tube if she continues to refuse to drink or eat. During previous admission D2 diet was recommended. Speech therapist assessed on 8/22 recommending Dys 1 diet. Palliative care to see the patient given that she is a full code and will likely need a feeding tube if aggressive care continues to be desired by the patient's family. Will add air overlay mattress    Recent NSTEMI (non-ST elevated myocardial infarction-July 2017)  NPO so preadmission statin, beta blocker, aspirin and Plavix on hold.    Hyperlipidemia NPO so statin on hold.    Essential hypertension Current blood pressures well controlled.    Right Adrenal nodule  Seen on previous imaging with radiology's recommendation for MRI to better characterize once renal function has recovered.    Family Communication/Anticipated D/C date and plan/Code Status   DVT prophylaxis: Lovenox ordered. Code Status: Full Code.  Family Communication: No family at the bedside. Spoke with daughter, Paula West at [phone], seems to have low healthcare literacy but was agreeable to speak with the palliative care team. Disposition Plan: From a SNF. Likely will return there.   Medical Consultants:    Palliative Care   Procedures:    None  Anti-Infectives:   Cipro 09/07/15--->  Subjective:    [name] is nonverbal. Responds to painful stimuli. Does not follow commands.   Objective:    Vitals:   09/08/15 2000 09/09/15 0700 09/09/15 0720 09/09/15 1500  BP: (!) 148/74 (!) 153/131 (!) 144/62 (!) 148/64  Pulse: (!) 59 61  64  Resp: 18 17  18   Temp: 97.8 F (36.6 C) 97.9 F (36.6 C)  97.6 F (36.4 C)  TempSrc: Axillary Axillary  Axillary  SpO2: 98% 98%  98%  Weight:  71.3 kg (157 lb 1.6 oz)    Height:        Intake/Output Summary (Last 24 hours) at 09/09/15 1557 Last data filed at 09/09/15 1500  Gross per 24 hour    Intake          2496.67 ml  Output                0 ml  Net          2496.67 ml   Filed Weights   09/07/15 1203 09/09/15 0700  Weight: 74.8 kg (165 lb) 71.3 kg (157 lb 1.6 oz)    Exam: General exam: Nonverbal, curled up in a fetal position. Respiratory system: Clear to auscultation. Respiratory effort normal. Cardiovascular system: S1 & S2 heard, RRR. No JVD,  rubs, gallops or clicks. No murmurs. Gastrointestinal system: Abdomen is nondistended, soft and nontender. No organomegaly or masses felt. Normal bowel sounds heard. Central nervous system: Alert, nonverbal. Extremities: No clubbing,  or cyanosis. No edema. Skin: No rashes, lesions or ulcers. Psychiatry: Judgement and insight appear normal. Mood & affect appropriate.   Data Reviewed:   I have personally reviewed following labs and imaging studies:  Labs: Basic Metabolic Panel:  Recent Labs Lab 09/07/15 1320 09/07/15 1918 09/08/15 0540 09/09/15 0442  NA 138  --  142 142  K 3.8  --  3.7 3.3*  CL 111  --  113* 114*  CO2 18*  --  17* 18*  GLUCOSE 197*  --  109* 113*  BUN 63*  --  45* 26*  CREATININE 2.02*  --  1.61* 1.30*  CALCIUM 9.0  --  9.0 8.6*  MG  --  2.0  --   --   PHOS  --  2.2*  --   --    GFR Estimated Creatinine Clearance: 33.4 mL/min (by C-G formula based on SCr of 1.3 mg/dL). Liver Function Tests:  Recent Labs Lab 09/07/15 1320  AST 22  ALT 25  ALKPHOS 113  BILITOT 0.7  PROT 6.6  ALBUMIN 2.9*   No results for input(s): LIPASE, AMYLASE in the last 168 hours. No results for input(s): AMMONIA in the last 168 hours. Coagulation profile No results for input(s): INR, PROTIME in the last 168 hours.  CBC:  Recent Labs Lab 09/07/15 1320 09/08/15 0540  WBC 8.8 9.0  NEUTROABS 6.0  --   HGB 13.5 14.1  HCT 41.4 43.3  MCV 80.7 80.0  PLT 130* 121*   Cardiac Enzymes: No results for input(s): CKTOTAL, CKMB, CKMBINDEX, TROPONINI in the last 168 hours. BNP (last 3 results) No results for  input(s): PROBNP in the last 8760 hours. CBG:  Recent Labs Lab 09/08/15 2043 09/09/15 0053 09/09/15 0440 09/09/15 0819 09/09/15 1133  GLUCAP 96 110* 109* 126* 125*   D-Dimer: No results for input(s): DDIMER in the last 72 hours. Hgb A1c: No results for input(s): HGBA1C in the last 72 hours. Lipid Profile: No results for input(s): CHOL, HDL, LDLCALC, TRIG, CHOLHDL, LDLDIRECT in the last 72 hours. Thyroid function studies: No results for input(s): TSH, T4TOTAL, T3FREE, THYROIDAB in the last 72 hours.  Invalid input(s): FREET3 Anemia work up: No results for input(s): VITAMINB12, FOLATE, FERRITIN, TIBC, IRON, RETICCTPCT in the last 72 hours. Sepsis Labs:  Recent Labs  Lab 09/07/15 1259 09/07/15 1320 09/07/15 1917 09/07/15 1918 09/07/15 2205 09/08/15 0540 09/09/15 0442  PROCALCITON  --   --   --  0.10  --   --  <0.10  WBC  --  8.8  --   --   --  9.0  --   LATICACIDVEN 1.91*  --  2.7*  --  2.4* 2.3*  --     Microbiology Recent Results (from the past 240 hour(s))  Urine culture     Status: Abnormal   Collection Time: 09/07/15 12:26 PM  Result Value Ref Range Status   Specimen Description URINE, CATHETERIZED  Final   Special Requests NONE  Final   Culture 50,000 COLONIES/mL ESCHERICHIA COLI (A)  Final   Report Status 09/09/2015 FINAL  Final   Organism ID, Bacteria ESCHERICHIA COLI (A)  Final      Susceptibility   Escherichia coli - MIC*    AMPICILLIN <=2 SENSITIVE Sensitive     CEFAZOLIN <=4 SENSITIVE Sensitive     CEFTRIAXONE <=1 SENSITIVE Sensitive     CIPROFLOXACIN <=0.25 SENSITIVE Sensitive     GENTAMICIN <=1 SENSITIVE Sensitive     IMIPENEM <=0.25 SENSITIVE Sensitive     NITROFURANTOIN <=16 SENSITIVE Sensitive     TRIMETH/SULFA <=20 SENSITIVE Sensitive     AMPICILLIN/SULBACTAM <=2 SENSITIVE Sensitive     PIP/TAZO <=4 SENSITIVE Sensitive     Extended ESBL NEGATIVE Sensitive     * 50,000 COLONIES/mL ESCHERICHIA COLI  Culture, blood (Routine x 2)     Status:  None (Preliminary result)   Collection Time: 09/07/15  1:20 PM  Result Value Ref Range Status   Specimen Description BLOOD RIGHT FOREARM  Final   Special Requests IN PEDIATRIC BOTTLE  3CC  Final   Culture NO GROWTH 1 DAY  Final   Report Status PENDING  Incomplete  Culture, blood (Routine x 2)     Status: None (Preliminary result)   Collection Time: 09/07/15  1:38 PM  Result Value Ref Range Status   Specimen Description BLOOD LEFT HAND  Final   Special Requests IN PEDIATRIC BOTTLE  1CC  Final   Culture NO GROWTH 1 DAY  Final   Report Status PENDING  Incomplete  MRSA PCR Screening     Status: None   Collection Time: 09/07/15  6:12 PM  Result Value Ref Range Status   MRSA by PCR NEGATIVE NEGATIVE Final    Comment:        The GeneXpert MRSA Assay (FDA approved for NASAL specimens only), is one component of a comprehensive MRSA colonization surveillance program. It is not intended to diagnose MRSA infection nor to guide or monitor treatment for MRSA infections.     Radiology: No results found.  Medications:   . ciprofloxacin  400 mg Intravenous Q24H  . enoxaparin (LOVENOX) injection  30 mg Subcutaneous Q24H  . insulin aspart  0-9 Units Subcutaneous Q4H  . prednisoLONE acetate  1 drop Both Eyes QID   Continuous Infusions: . sodium chloride 100 mL/hr at 09/08/15 1758    Time spent: 25 minutes.   LOS: 2 days   [place], [facility] Pager [phone]. If unable to reach me by pager, please call my cell phone at [phone].  *Please refer to [email or web address], password [email or web address] to get updated schedule on who will round on this patient, as hospitalists switch teams weekly. If 7PM-7AM, please contact night-coverage at [email or web address], password [email or web address] for any overnight needs.  09/09/2015, 3:57 PM

## 2015-09-09 NOTE — Progress Notes (Signed)
Speech Language Pathology Treatment: Dysphagia  Patient Details Name: Paula West MRN: [id number] DOB: 09-13-1932 Today's Date: 09/09/2015 Time: [id number] SLP Time Calculation (min) (ACUTE ONLY): 23 min  Assessment / Plan / Recommendation Clinical Impression  Pt seen for dysphagia followup. Pt alert this date and positioned upright in bed. Pt able to initiate sucking from straw and swallow without overt s/s aspiration noted. Pharyngeal delay is suspected. Pt demonstrated oral holding with boluses of puree, however thin liquid wash was effective to promote oral clearance. Given max cueing and prolonged time required for minimal intake, do not anticipate pt will be able to meet her nutritional needs orally unless she improves. However, it appears reasonable at this time to initiate a conservative diet of Dys 1 with thins and give the pt opportunity to take PO as desired. Careful feeding is required with pt in a fully upright position. Given cognitive impairment and variability of presentation, pt is at an increased risk for aspiration and precautions should be taken. Discussed with RN. Could trial meds crushed in puree. Will follow closely. Encouraged RN staff to have a low threshold for holding PO- if alertness is not adequate, if pt refuses PO and certainly if any overt s/s aspiration present.    HPI HPI: Paula West a 82 y.o.femalewith medical history significant for diabetes on oral agents, stage III chronic kidney disease, COPD, hypertension, dyslipidemia and recent thalamic stroke in June 2017 with residual dysarthria. Patient discharged to a skilled nursing facility. Since her stroke she has been admitted to the hospital twice:The first in mid July for chest pain and non-STEMI with recommendations to continue medical therapy upon discharge; the second was in early August for acute kidney injury secondary to dehydration from failure to thrive. At that time an incidental 3 cm nodule in the  right adrenal gland was noted with recommendations from the radiology to obtain MRI once kidney function documented stable.        SLP Plan  Continue with current plan of care     Recommendations  Diet recommendations: Thin liquid;Dysphagia 1 (puree) Liquids provided via: Straw Medication Administration: Crushed with puree Supervision: Trained caregiver to feed patient Compensations: Slow rate;Small sips/bites;Follow solids with liquid Postural Changes and/or Swallow Maneuvers: Seated upright 90 degrees;Upright 30-60 min after meal             Oral Care Recommendations: Oral care QID Follow up Recommendations: Skilled Nursing facility (palliative care) Plan: Continue with current plan of care     [place], [place] Pager [phone] 09/09/2015, 9:32 AM

## 2015-09-09 NOTE — Progress Notes (Signed)
Palliative:  I will meet Ms. Amparo's daughter, Sampson Goon, tomorrow 09/10/2015 at 2pm to discuss [place]. Thank you for this consult.   Vinie Sill, NP Palliative Medicine Team Pager # [phone] (M-F 8a-5p) Team Phone # [phone] (Nights/Weekends)

## 2015-09-09 NOTE — Progress Notes (Signed)
During assessment of pt, she winced and held her stomach when stethoscope was placed on both the left and right sides of her upper abdomen near midline. The same reaction was noted when those areas were pressed on. Pt has been NPO with PO med trial due post speech pathologist swallow study. MD notified.  Brittinie Wherley O Gaynelle Pastrana 10:20 AM 09/09/2015

## 2015-09-10 ENCOUNTER — Inpatient Hospital Stay (HOSPITAL_COMMUNITY): Payer: Medicare Other

## 2015-09-10 DIAGNOSIS — R630 Anorexia: Secondary | ICD-10-CM

## 2015-09-10 DIAGNOSIS — N179 Acute kidney failure, unspecified: Principal | ICD-10-CM

## 2015-09-10 DIAGNOSIS — Z515 Encounter for palliative care: Secondary | ICD-10-CM

## 2015-09-10 DIAGNOSIS — I10 Essential (primary) hypertension: Secondary | ICD-10-CM

## 2015-09-10 DIAGNOSIS — Z66 Do not resuscitate: Secondary | ICD-10-CM

## 2015-09-10 DIAGNOSIS — R1310 Dysphagia, unspecified: Secondary | ICD-10-CM

## 2015-09-10 DIAGNOSIS — E860 Dehydration: Secondary | ICD-10-CM

## 2015-09-10 DIAGNOSIS — E43 Unspecified severe protein-calorie malnutrition: Secondary | ICD-10-CM

## 2015-09-10 LAB — BASIC METABOLIC PANEL
ANION GAP: 11 (ref 5–15)
BUN: 20 mg/dL (ref 6–20)
CALCIUM: 8.9 mg/dL (ref 8.9–10.3)
CHLORIDE: 111 mmol/L (ref 101–111)
CO2: 18 mmol/L — AB (ref 22–32)
CREATININE: 1.28 mg/dL — AB (ref 0.44–1.00)
GFR calc non Af Amer: 38 mL/min — ABNORMAL LOW (ref >60)
GFR, EST AFRICAN AMERICAN: 45 mL/min — AB (ref >60)
Glucose, Bld: 129 mg/dL — ABNORMAL HIGH (ref 65–99)
Potassium: 3.1 mmol/L — ABNORMAL LOW (ref 3.5–5.1)
SODIUM: 140 mmol/L (ref 135–145)

## 2015-09-10 LAB — AMMONIA: AMMONIA: 25 umol/L (ref 9–35)

## 2015-09-10 LAB — GLUCOSE, CAPILLARY
GLUCOSE-CAPILLARY: 152 mg/dL — AB (ref 65–99)
GLUCOSE-CAPILLARY: 138 mg/dL — AB (ref 65–99)
GLUCOSE-CAPILLARY: 97 mg/dL (ref 65–99)
Glucose-Capillary: 121 mg/dL — ABNORMAL HIGH (ref 65–99)

## 2015-09-10 LAB — CBC
HCT: 38.5 % (ref 36.0–46.0)
HEMOGLOBIN: 12.5 g/dL (ref 12.0–15.0)
MCH: 25.8 pg — ABNORMAL LOW (ref 26.0–34.0)
MCHC: 32.5 g/dL (ref 30.0–36.0)
MCV: 79.4 fL (ref 78.0–100.0)
PLATELETS: 170 10*3/uL (ref 150–400)
RBC: 4.85 MIL/uL (ref 3.87–5.11)
RDW: 16.5 % — ABNORMAL HIGH (ref 11.5–15.5)
WBC: 12.2 10*3/uL — AB (ref 4.0–10.5)

## 2015-09-10 MED ORDER — KCL IN DEXTROSE-NACL 40-5-0.45 MEQ/L-%-% IV SOLN
INTRAVENOUS | Status: DC
  Administered 2015-09-10 – 2015-09-12 (×2): via INTRAVENOUS
  Filled 2015-09-10 (×5): qty 1000

## 2015-09-10 MED ORDER — POTASSIUM CHLORIDE 10 MEQ/100ML IV SOLN: 10.0000 meq | INTRAVENOUS | Status: DC

## 2015-09-10 MED ORDER — ASPIRIN 300 MG RE SUPP
300.0000 mg | Freq: Every day | RECTAL | Status: DC
  Administered 2015-09-10 – 2015-09-13 (×4): 300 mg via RECTAL
  Filled 2015-09-10 (×5): qty 1

## 2015-09-10 NOTE — Consult Note (Signed)
Consultation Note Date: 09/10/2015   Patient Name: Paula West  DOB: 1932/11/02  MRN: [id number]  Age / Sex: 82 y.o., female  PCP: No Pcp Per Patient Referring Physician: Janece Canterbury, MD  Reason for Consultation: Establishing goals of care  HPI/Patient Profile: 82 y.o. female  with past medical history of recent thalamic CVA June 2017 with residual right sided weakness, DM, HLD, CKD 3, COPD, poorly communicative at baseline admitted 09/07/2015 with decreased responsiveness and lethargy. Recently d/c from hospital 08/25/15 with dehydration and acute renal failure but responded to fluids. Appetite and hydration continue to be problematic. Incidental finding of 3 cm nodule right adrenal gland with recommendations for MRI f/u once renal function improves - does not appear this has been done.   Clinical Assessment and Goals of Care: I met today at [name]'s bedside along with her daughter, [name], and her boyfriend, Rodman Key. She also has a son, Collier Bullock, but I was unable to reach him - left voicemail. [name] is very lethargic and unable to participate in conversation although she is present.   I spoke more with family about her condition and baseline - she was fully functional prior to stroke per daughter but daughter also says that she has been caring for her for 8 yrs? [name] says that she used a cane but could walk in the house and enjoyed TV and puzzle books. [name] also says she was "sharp" mentally. Since stroke she has been less interactive and poor communication - seems that she was trying to speak but difficult to understand prior to this admission per family. Difficult to get a good baseline. She has only recently been living at [facility] but they have been happy and wish to return there.   We discuss poor prognosis and I explain the cycle of poor intake s/p stroke and that she is unlikely  to improve from her current condition. I fear this will only get worse. Family has poor understanding of current condition and effects from stroke and have been very hopeful for improvement. However, [name] tells me that she has made peace "when God is ready for my mom I accept that" - with this we discuss and she agrees DNR - "my mom has been through enough already."  [name] brings up feeding tube as this has been discussed with neurologist recently. [name] says that she and her brother have already discussed and they want feeding tube. We discuss risks vs benefits or feeding tube and I reiterate that this may give her calories and keep her hydrated but this will not repair the damage done by the stroke and unlikely to improve her QOL. Also reiterated that there may be a time when continuing artificial feeding does make sense when we are only prolonging suffering and not improving QOL. [name] seems to understand this concept more than Rodman Key.    NEXT OF KIN daughter [name], son [name] - not present today    SUMMARY OF RECOMMENDATIONS   - DNR - Desire feeding tube, likely will  need PEG - will try and confirm decisions with brother - await return call - Desire continued treatment of reversible conditions and to prolong life for now - Family receptive to conversation but struggle with understanding current condition and expectations  Code Status/Advance Care Planning:  DNR   Symptom Management:   No signs of pain or discomfort. Keep clean, dry. Turn q2h. High risk for decubitus ulcers.   Will likely need PEG for nutrition/hydration per family wishes.   Palliative Prophylaxis:   Aspiration, Bowel Regimen, Delirium Protocol, Oral Care and Turn Reposition  Additional Recommendations (Limitations, Scope, Preferences):  Full Scope Treatment  Psycho-social/Spiritual:   Desire for further Chaplaincy support:no  Additional Recommendations: Caregiving  Support/Resources  Prognosis:   Unable to  determine  Discharge Planning: [facility] [place] for rehab with Palliative care service follow-up      Primary Diagnoses: Present on Admission: . Dehydration . AKI (acute kidney injury) ([place]) . CKD (chronic kidney disease), stage III . COPD (chronic obstructive pulmonary disease) ([place]) . Essential hypertension . Hyperlipidemia . Thalamic infarction (June 2017 w/ residual dysarthria/FTT) . UTI (lower urinary tract infection) . Metabolic encephalopathy . Recent NSTEMI (non-ST elevated myocardial infarction-July 2017) ([place]) . Right Adrenal nodule ([place])   I have reviewed the medical record, interviewed the patient and family, and examined the patient. The following aspects are pertinent.  Past Medical History:  Diagnosis Date  . Asthma   . CKD (chronic kidney disease), stage II   . COPD (chronic obstructive pulmonary disease) ([place])   . Diabetes mellitus   . Hyperlipidemia   . Hypertension    Social History   Social History  . Marital status: Widowed    Spouse name: N/A  . Number of children: N/A  . Years of education: N/A   Social History Main Topics  . Smoking status: Former Research scientist (life sciences)  . Smokeless tobacco: Former Systems developer  . Alcohol use No  . Drug use: No  . Sexual activity: Not Asked   Other Topics Concern  . None   Social History Narrative   Lives   Caffeine use:    Family History  Problem Relation Age of Onset  . Diabetes Sister   . Stroke Mother   . CAD Neg Hx    Scheduled Meds: . ciprofloxacin  400 mg Intravenous Q24H  . enoxaparin (LOVENOX) injection  30 mg Subcutaneous Q24H  . insulin aspart  0-9 Units Subcutaneous Q4H  . prednisoLONE acetate  1 drop Both Eyes QID   Continuous Infusions: . sodium chloride 100 mL/hr at 09/08/15 1758   PRN Meds:.acetaminophen **OR** acetaminophen, albuterol Medications Prior to Admission:  Prior to Admission medications   Medication Sig Start Date End Date Taking? Authorizing Provider  albuterol (PROAIR HFA)  108 (90 Base) MCG/ACT inhaler Inhale 2 puffs into the lungs every 6 (six) hours as needed for wheezing or shortness of breath.   Yes Historical Provider, MD  aspirin 81 MG tablet Take 1 tablet (81 mg total) by mouth daily. 07/12/15  Yes [name], MD  atorvastatin (LIPITOR) 40 MG tablet Take 1 tablet (40 mg total) by mouth daily at 6 PM. 07/13/15  Yes [name], MD  carvedilol (COREG) 12.5 MG tablet Take 1 tablet (12.5 mg total) by mouth 2 (two) times daily with a meal. 08/05/15  Yes [name], MD  CEFTRIAXONE SODIUM IV Inject 1 g into the vein daily.   Yes Historical Provider, MD  clopidogrel (PLAVIX) 75 MG tablet Take 1 tablet (75 mg total) by  mouth daily. 08/05/15  Yes [name], MD  colchicine (COLCRYS) 0.6 MG tablet Take 0.6 mg by mouth 2 (two) times daily.    Yes Historical Provider, MD  gabapentin (NEURONTIN) 300 MG capsule Take 300 mg by mouth 3 (three) times daily.   Yes Historical Provider, MD  linagliptin (TRADJENTA) 5 MG TABS tablet Take 5 mg by mouth daily.   Yes Historical Provider, MD  losartan-hydrochlorothiazide (HYZAAR) 100-25 MG tablet Take 1 tablet by mouth daily.   Yes Historical Provider, MD  omeprazole (PRILOSEC) 20 MG capsule Take 2 capsules (40 mg total) by mouth 2 (two) times daily before a meal. 08/05/15  Yes [name], MD  prednisoLONE acetate (PRED FORTE) 1 % ophthalmic suspension Place 1 drop into both eyes 4 (four) times daily. 08/12/15  Yes Historical Provider, MD  ranitidine (ZANTAC) 300 MG tablet Take 1 tablet (300 mg total) by mouth at bedtime. 08/05/15  Yes [name], MD  sucralfate (CARAFATE) 1 GM/10ML suspension Take 10 mLs (1 g total) by mouth 3 (three) times daily. 08/05/15  Yes [name], MD  traMADol (ULTRAM) 50 MG tablet Take 50 mg by mouth every 12 (twelve) hours as needed for moderate pain.   Yes Historical Provider, MD   Allergies  Allergen Reactions  . Codeine Rash  . Darvon [Propoxyphene] Rash  . Penicillins Rash    Has patient had a  PCN reaction causing immediate rash, facial/tongue/throat swelling, SOB or lightheadedness with hypotension: Yes Has patient had a PCN reaction causing severe rash involving mucus membranes or skin necrosis: No Has patient had a PCN reaction that required hospitalization No Has patient had a PCN reaction occurring within the last 10 years: No If all of the above answers are "NO", then may proceed with Cephalosporin use.    Review of Systems  Unable to perform ROS: Patient nonverbal    Physical Exam  Constitutional: She appears well-developed. She appears lethargic.  HENT:  Head: Normocephalic and atraumatic.  Cardiovascular: Normal rate and regular rhythm.   Pulmonary/Chest: Effort normal and breath sounds normal. No accessory muscle usage. No tachypnea. No respiratory distress.  Abdominal: Soft. Normal appearance.  Neurological: She appears lethargic. She is disoriented.  Aphasic     Vital Signs: BP 138/72 (BP Location: Left Arm)   Pulse 88   Temp 97.9 F (36.6 C) (Tympanic)   Resp 18   Ht _0  (1.626 m)   Wt 71.3 kg (157 lb 1.6 oz)   SpO2 98%   BMI 26.97 kg/m  Pain Assessment: Faces POSS *See Group Information*: S-Acceptable,Sleep, easy to arouse Pain Score: Asleep   SpO2: SpO2: 98 % O2 Device:SpO2: 98 % O2 Flow Rate: .   IO: Intake/output summary:  Intake/Output Summary (Last 24 hours) at 09/10/15 1552 Last data filed at 09/09/15 1700  Gross per 24 hour  Intake                0 ml  Output                0 ml  Net                0 ml    LBM: Last BM Date: 09/09/15 Baseline Weight: Weight: 74.8 kg (165 lb) Most recent weight: Weight: 71.3 kg (157 lb 1.6 oz)     Palliative Assessment/Data: PPS: 10%   Flowsheet Rows   Flowsheet Row Most Recent Value  Intake Tab  Referral Department  Hospitalist  Unit at Time of Referral  Orthopedic Unit  Palliative Care Primary Diagnosis  Neurology  Date Notified  09/08/15  Palliative Care Type  New Palliative care    Reason for referral  Clarify Goals of Care  Date of Admission  09/07/15  Date first seen by Palliative Care  09/09/15  # of days Palliative referral response time  1 Day(s)  # of days IP prior to Palliative referral  1  Clinical Assessment  Psychosocial & Spiritual Assessment  Palliative Care Outcomes      Time In: 1350 Time Out: 1500 Time Total: 81mn Greater than 50%  of this time was spent counseling and coordinating care related to the above assessment and plan.  Signed by: PPershing Proud NP   Please contact Palliative Medicine Team phone at [phone] questions and concerns.  For individual provider: See AShea Evans

## 2015-09-10 NOTE — Care Management Important Message (Signed)
Important Message  Patient Details  Name: Paula West MRN: [id number] Date of Birth: Dec 16, 1932   Medicare Important Message Given:  Yes    Saran Laviolette Montine Circle 09/10/2015, 11:47 AM

## 2015-09-10 NOTE — Progress Notes (Addendum)
PROGRESS NOTE  Paula West  XNT:[id number] DOB: March 18, 1932 DOA: 09/07/2015 PCP: No PCP Per Patient  Brief Narrative:   Paula West is an 82 y.o. female who suffered an acute left thalamic infarct in late June.  She was hospitalized a few weeks later for chest pain and elevated troponin at which time plavix was added.  She was rehospitalized in early August for changes in speech and increased lethargy at which time she was found to be dehydrated with AKI with a creatinine of 4.  She was rehydrated and discharged to home.  She met with Dr. Jaynee Eagles, Neurology on 8/15, who noted that she had not had any improvement in her stroke symptoms and in fact had increasing confusion, decreased oral intake, and he was concerned that she may need a feeding tube.  She was admitted 09/07/15 from SNF due to decreased responsiveness and lethargy.  She had recently been started on antibiotics at SNF for a UTI. Upon initial presentation, the patient was found to have acute kidney injury with a creatinine of 2.  Since admission she has not had anything to eat or drink. She met with speech therapy who recommended a dysphagia 1 diet with thin liquids, however she refuses to eat. Palliative care was consulted and met with the family today. After prolonged discussion, she was transitioned to DO NOT RESUSCITATE status. The family elected for a feeding tube, however they were educated that this will likely not improve her stroke symptoms and she will continue to have inability to communicate with her family and they mostly bedridden.  Assessment & Plan:   Principal Problem:   AKI (acute kidney injury) ([place]) Active Problems:   COPD (chronic obstructive pulmonary disease) ([place])   Hyperlipidemia   Diabetes mellitus, controlled ([place])   Essential hypertension   Thalamic infarction (June 2017 w/ residual dysarthria/FTT)   CKD (chronic kidney disease), stage III   Dehydration   UTI (lower urinary tract infection)  Metabolic encephalopathy   Recent NSTEMI (non-ST elevated myocardial infarction-July 2017) ([place])   Right Adrenal nodule ([facility])   Altered mental status   Protein-calorie malnutrition, severe   Palliative care encounter   DNR (do not resuscitate)   Poor appetite   Lethargy, metabolic encephalopathy superimposed on recent thalamic infarct in June.  During this admission, treated to dehydration and urinary tract infection.  She is completed at least 3 days of IV antibiotics for urinary tract infection and been rehydrated. Her creatinine is back to baseline. Despite these efforts, she continues to have profound right hemineglect and right hemiparesis. After reviewing her records from her last several admissions, these changes appear to be new.  During her previous admissions, she was able to communicate at least somewhat with the physicians and the neurology notes state that she had a mild right facial droop, but do not comment on any right hemicolectomy neglect or right hemiparesis even on 09/02/2015.  I suspect that she has had another stroke. -  MRI brain -  If MRI positive for stroke, she will need another neurology assessment to determine if she needs to change to her current therapy -  Start rectal aspirin pending feeding tube placement -  PT/OT assessment -  Additional stroke work up was completed in June when she had her first stroke  Dysphagia with severe protein calorie malnutrition -  IR consult for gastrostomy tube placement -  Tube feeds per nutrition  AKI (acute kidney injury) secondary to dehydration, in the setting of stage III CKD -  Continue to hold diuretic and ARB -  Continue IV fluids  Hypokalemia, add potassium to IVF  UTI (lower urinary tract infection) -  Discontinue ciprofloxacin as this can worsen encephalopathy and do not want to confound   COPD (chronic obstructive pulmonary disease), stable, continue bronchodilators as needed.  Diabetes mellitus,  controlled  -  Hold Tradjenta  - continue SSI - A1c was 8.4 on 7/15  Recent NSTEMI (non-ST elevated myocardial infarction-July 2017)  resume statin, beta blocker, and Plavix once feeding tube placed  Essential hypertension Current blood pressures well controlled.  Right Adrenal nodule, 3cm with indeterminate Hounsfield unit measurements and poorly defined margins -  Outpatient MRI  Enlarged left ovary -  Outpatient pelvic US  DVT prophylaxis:  lovenox Code Status:  DNR Family Communication:  Palliative care held a family meeting today Disposition Plan:  To SNF once feeding tube placed and additional MRI performed   Consultants:   Palliative care  Procedures:  none  Antimicrobials:   Ciprofloxacin 8/21    Subjective: Unable to communicate  Objective: Vitals:   09/09/15 1500 09/09/15 2221 09/10/15 0522 09/10/15 1300  BP: (!) 148/64 (!) 143/84 139/71 138/72  Pulse: 64 79 81 88  Resp: _0 Temp: 97.6 F (36.4 C) 97.8 F (36.6 C) 97.7 F (36.5 C) 97.9 F (36.6 C)  TempSrc: Axillary Axillary Axillary Tympanic  SpO2: 98% 97% 98% 98%  Weight:      Height:       No intake or output data in the 24 hours ending 09/10/15 1722 Filed Weights   09/07/15 1203 09/09/15 0700  Weight: 74.8 kg (165 lb) 71.3 kg (157 lb 1.6 oz)    Examination:  General exam:  Adult female, slumped to the right in bed.  Right facial droop.  No acute distress.  HEENT:  NCAT, MMM Respiratory system: Clear to auscultation bilaterally Cardiovascular system: Regular rate and rhythm, normal S1/S2. No murmurs, rubs, gallops or clicks.  Warm extremities Gastrointestinal system: Normal active bowel sounds, soft, nondistended, nontender. MSK:  Decreased tone and bulk, no lower extremity edema Neuro:  Mild right facial droop.  Difficulty looking to the right.  Ignores right hand and right leg.  Flaccid right arm and right leg.  Moves left arm and left leg spontaneously.      Data  Reviewed: I have personally reviewed following labs and imaging studies  CBC:  Recent Labs Lab 09/07/15 1320 09/08/15 0540 09/10/15 0517  WBC 8.8 9.0 12.2*  NEUTROABS 6.0  --   --   HGB 13.5 14.1 12.5  HCT 41.4 43.3 38.5  MCV 80.7 80.0 79.4  PLT 130* 121* 161   Basic Metabolic Panel:  Recent Labs Lab 09/07/15 1320 09/07/15 1918 09/08/15 0540 09/09/15 0442 09/10/15 0517  NA 138  --  142 142 140  K 3.8  --  3.7 3.3* 3.1*  CL 111  --  113* 114* 111  CO2 18*  --  17* 18* 18*  GLUCOSE 197*  --  109* 113* 129*  BUN 63*  --  45* 26* 20  CREATININE 2.02*  --  1.61* 1.30* 1.28*  CALCIUM 9.0  --  9.0 8.6* 8.9  MG  --  2.0  --   --   --   PHOS  --  2.2*  --   --   --    GFR: Estimated Creatinine Clearance: 33.9 mL/min (by C-G formula based on SCr of 1.28 mg/dL). Liver Function Tests:  Recent  Labs Lab 09/07/15 1320  AST 22  ALT 25  ALKPHOS 113  BILITOT 0.7  PROT 6.6  ALBUMIN 2.9*   No results for input(s): LIPASE, AMYLASE in the last 168 hours.  Recent Labs Lab 09/10/15 0409  AMMONIA 25   Coagulation Profile: No results for input(s): INR, PROTIME in the last 168 hours. Cardiac Enzymes: No results for input(s): CKTOTAL, CKMB, CKMBINDEX, TROPONINI in the last 168 hours. BNP (last 3 results) No results for input(s): PROBNP in the last 8760 hours. HbA1C: No results for input(s): HGBA1C in the last 72 hours. CBG:  Recent Labs Lab 09/09/15 2007 09/09/15 2349 09/10/15 0421 09/10/15 1101 09/10/15 1702  GLUCAP 123* 129* 121* 152* 138*   Lipid Profile: No results for input(s): CHOL, HDL, LDLCALC, TRIG, CHOLHDL, LDLDIRECT in the last 72 hours. Thyroid Function Tests: No results for input(s): TSH, T4TOTAL, FREET4, T3FREE, THYROIDAB in the last 72 hours. Anemia Panel: No results for input(s): VITAMINB12, FOLATE, FERRITIN, TIBC, IRON, RETICCTPCT in the last 72 hours. Urine analysis:    Component Value Date/Time   COLORURINE YELLOW 09/07/2015 1226    APPEARANCEUR TURBID (A) 09/07/2015 1226   LABSPEC 1.020 09/07/2015 1226   PHURINE 5.0 09/07/2015 1226   GLUCOSEU 100 (A) 09/07/2015 1226   HGBUR LARGE (A) 09/07/2015 1226   BILIRUBINUR NEGATIVE 09/07/2015 1226   KETONESUR NEGATIVE 09/07/2015 1226   PROTEINUR 30 (A) 09/07/2015 1226   UROBILINOGEN 0.2 06/22/2010 1226   NITRITE NEGATIVE 09/07/2015 1226   LEUKOCYTESUR LARGE (A) 09/07/2015 1226   Sepsis Labs: _0 (procalcitonin:4,lacticidven:4)  ) Recent Results (from the past 240 hour(s))  Urine culture     Status: Abnormal   Collection Time: 09/07/15 12:26 PM  Result Value Ref Range Status   Specimen Description URINE, CATHETERIZED  Final   Special Requests NONE  Final   Culture 50,000 COLONIES/mL ESCHERICHIA COLI (A)  Final   Report Status 09/09/2015 FINAL  Final   Organism ID, Bacteria ESCHERICHIA COLI (A)  Final      Susceptibility   Escherichia coli - MIC*    AMPICILLIN <=2 SENSITIVE Sensitive     CEFAZOLIN <=4 SENSITIVE Sensitive     CEFTRIAXONE <=1 SENSITIVE Sensitive     CIPROFLOXACIN <=0.25 SENSITIVE Sensitive     GENTAMICIN <=1 SENSITIVE Sensitive     IMIPENEM <=0.25 SENSITIVE Sensitive     NITROFURANTOIN <=16 SENSITIVE Sensitive     TRIMETH/SULFA <=20 SENSITIVE Sensitive     AMPICILLIN/SULBACTAM <=2 SENSITIVE Sensitive     PIP/TAZO <=4 SENSITIVE Sensitive     Extended ESBL NEGATIVE Sensitive     * 50,000 COLONIES/mL ESCHERICHIA COLI  Culture, blood (Routine x 2)     Status: None (Preliminary result)   Collection Time: 09/07/15  1:20 PM  Result Value Ref Range Status   Specimen Description BLOOD RIGHT FOREARM  Final   Special Requests IN PEDIATRIC BOTTLE  3CC  Final   Culture NO GROWTH 3 DAYS  Final   Report Status PENDING  Incomplete  Culture, blood (Routine x 2)     Status: None (Preliminary result)   Collection Time: 09/07/15  1:38 PM  Result Value Ref Range Status   Specimen Description BLOOD LEFT HAND  Final   Special Requests IN PEDIATRIC BOTTLE   1CC  Final   Culture NO GROWTH 3 DAYS  Final   Report Status PENDING  Incomplete  MRSA PCR Screening     Status: None   Collection Time: 09/07/15  6:12 PM  Result Value Ref Range Status  MRSA by PCR NEGATIVE NEGATIVE Final    Comment:        The GeneXpert MRSA Assay (FDA approved for NASAL specimens only), is one component of a comprehensive MRSA colonization surveillance program. It is not intended to diagnose MRSA infection nor to guide or monitor treatment for MRSA infections.       Radiology Studies: No results found.   Scheduled Meds: . ciprofloxacin  400 mg Intravenous Q24H  . enoxaparin (LOVENOX) injection  30 mg Subcutaneous Q24H  . insulin aspart  0-9 Units Subcutaneous Q4H  . prednisoLONE acetate  1 drop Both Eyes QID   Continuous Infusions: . sodium chloride 100 mL/hr at 09/08/15 1758     LOS: 3 days    Time spent: 30 min    Janece Canterbury, MD [facility] Pager [phone]  If 7PM-7AM, please contact night-coverage [email or web address] Password [email or web address] 09/10/2015, 5:22 PM

## 2015-09-10 NOTE — Progress Notes (Signed)
Paged [facility] regarding if it is ok for pt to go to MRI/other procedures without cont telemetry. Awaiting orders or call back.

## 2015-09-10 NOTE — Progress Notes (Signed)
Speech Language Pathology Treatment: Dysphagia  Patient Details Name: Paula West MRN: [id number] DOB: 1932-07-23 Today's Date: 09/10/2015 Time: [id number] SLP Time Calculation (min) (ACUTE ONLY): 29 min  Assessment / Plan / Recommendation Clinical Impression  Pt seen for dysphagia followup. Pt initially lethargic, but alerted with upright positioning and oral care. Pt was less participative with PO this date- demonstrating oral holding for 3 minutes, ultimately requiring suction to remove boluses and intermittently turning her head away when PO was offered. Pt is not appropriate for PO intake this date due to increased aspiration risk. Difficult to determine if this presentation is secondary to dysphagia or also if there is a component of motivation/interest. Recommend: NPO. Will follow.     HPI HPI: Paula West a 82 y.o.femalewith medical history significant for diabetes on oral agents, stage III chronic kidney disease, COPD, hypertension, dyslipidemia and recent thalamic stroke in June 2017 with residual dysarthria. Patient discharged to a skilled nursing facility. Since her stroke she has been admitted to the hospital twice:The first in mid July for chest pain and non-STEMI with recommendations to continue medical therapy upon discharge; the second was in early August for acute kidney injury secondary to dehydration from failure to thrive. At that time an incidental 3 cm nodule in the right adrenal gland was noted with recommendations from the radiology to obtain MRI once kidney function documented stable.        SLP Plan  Continue with current plan of care     Recommendations  Diet recommendations: NPO             Oral Care Recommendations: Oral care QID Follow up Recommendations: Skilled Nursing facility Plan: Continue with current plan of care     [place] MA, [place] Pager [phone] 09/10/2015, 10:48 AM

## 2015-09-10 NOTE — Progress Notes (Signed)
Initial Nutrition Assessment  DOCUMENTATION CODES:   Severe malnutrition in context of chronic illness  INTERVENTION:  If tube feeding decided within goals of care, recommend Jevity 1.2 formula at 20 ml/hr and increase by 10 ml every 8 hours to goal rate of 60 ml/hr which provides 1728 kcal, 80 grams of protein, and 1166 ml of H2O.   NUTRITION DIAGNOSIS:   Malnutrition related to chronic illness as evidenced by percent weight loss, severe depletion of body fat, severe depletion of muscle mass.  GOAL:   Patient will meet greater than or equal to 90% of their needs  MONITOR:   Diet advancement, Labs, Weight trends, Skin, I & O's  REASON FOR ASSESSMENT:   Low Braden    ASSESSMENT:   82 y.o. female with recent CVA few weeks ago with residual right sided weakness, DM, CKD 3, COPD, poorly communicative at baseline who was admitted 09/07/15 from SNF due to decreased responsiveness and lethargy. She had a fever few days ago and was started on antibiotics at [facility] for a UTI. Upon initial presentation, the patient was found to have acute kidney injury with a creatinine of 2.  Pt was lethargic during time of visit and did not respond to questions asked. Pt made NPO today as per SLP not appropriate for PO intake today as pt lethargic and at aspiration risk. No family at bedside. RD unable to obtain nutrition history. Pt with little to no PO intake since admission. Pt with weight loss. Per Epic weight records, pt with a 16.9% weight loss in 2 months. Per MD note, pt follow up at her neurologist Dr. Cathren West office on 09/02/15 at which at that point discussed with the patient and her family that she may need a feeding tube if she continues to refuse to drink or eat. Pt is currently full code. Plans for Palliative to meet with family today to discuss goals of care. Per MD note, feeding tube may be needed if aggressive is desired. TF recommendations have been stated above when/if needed. RD to continue to  monitor.   Nutrition-Focused physical exam completed. Findings are severe fat depletion, severe muscle depletion, and mild edema.   Labs and medications reviewed.   Diet Order:  Diet NPO time specified  Skin:  Reviewed, no issues  Last BM:  8/22  Height:   Ht Readings from Last 1 Encounters:  09/07/15 5\' 4"  (1.626 m)    Weight:   Wt Readings from Last 1 Encounters:  09/09/15 157 lb 1.6 oz (71.3 kg)    Ideal Body Weight:  54.5 kg  BMI:  Body mass index is 26.97 kg/m.  Estimated Nutritional Needs:   Kcal:  1650-1850  Protein:  75-85 grams  Fluid:  1.6 - 1.8 L/day  EDUCATION NEEDS:   No education needs identified at this time  Paula Parker, MS, RD, LDN Pager # [phone] After hours/ weekend pager # [phone]

## 2015-09-11 DIAGNOSIS — R4182 Altered mental status, unspecified: Secondary | ICD-10-CM

## 2015-09-11 DIAGNOSIS — Z794 Long term (current) use of insulin: Secondary | ICD-10-CM

## 2015-09-11 DIAGNOSIS — G8191 Hemiplegia, unspecified affecting right dominant side: Secondary | ICD-10-CM

## 2015-09-11 DIAGNOSIS — I639 Cerebral infarction, unspecified: Secondary | ICD-10-CM

## 2015-09-11 DIAGNOSIS — I6340 Cerebral infarction due to embolism of unspecified cerebral artery: Secondary | ICD-10-CM

## 2015-09-11 DIAGNOSIS — E1122 Type 2 diabetes mellitus with diabetic chronic kidney disease: Secondary | ICD-10-CM

## 2015-09-11 DIAGNOSIS — E279 Disorder of adrenal gland, unspecified: Secondary | ICD-10-CM

## 2015-09-11 DIAGNOSIS — N183 Chronic kidney disease, stage 3 (moderate): Secondary | ICD-10-CM

## 2015-09-11 DIAGNOSIS — J438 Other emphysema: Secondary | ICD-10-CM

## 2015-09-11 LAB — PROTIME-INR
INR: 1.24
Prothrombin Time: 15.7 seconds — ABNORMAL HIGH (ref 11.4–15.2)

## 2015-09-11 LAB — CBC
HEMATOCRIT: 44.6 % (ref 36.0–46.0)
Hemoglobin: 14.8 g/dL (ref 12.0–15.0)
MCH: 26.2 pg (ref 26.0–34.0)
MCHC: 33.2 g/dL (ref 30.0–36.0)
MCV: 78.9 fL (ref 78.0–100.0)
Platelets: 126 10*3/uL — ABNORMAL LOW (ref 150–400)
RBC: 5.65 MIL/uL — ABNORMAL HIGH (ref 3.87–5.11)
RDW: 16.5 % — AB (ref 11.5–15.5)
WBC: 10.3 10*3/uL (ref 4.0–10.5)

## 2015-09-11 LAB — GLUCOSE, CAPILLARY
GLUCOSE-CAPILLARY: 177 mg/dL — AB (ref 65–99)
GLUCOSE-CAPILLARY: 179 mg/dL — AB (ref 65–99)
GLUCOSE-CAPILLARY: 173 mg/dL — AB (ref 65–99)
GLUCOSE-CAPILLARY: 214 mg/dL — AB (ref 65–99)
Glucose-Capillary: 116 mg/dL — ABNORMAL HIGH (ref 65–99)
Glucose-Capillary: 157 mg/dL — ABNORMAL HIGH (ref 65–99)
Glucose-Capillary: 158 mg/dL — ABNORMAL HIGH (ref 65–99)

## 2015-09-11 LAB — BASIC METABOLIC PANEL
Anion gap: 13 (ref 5–15)
BUN: 19 mg/dL (ref 6–20)
CALCIUM: 8.9 mg/dL (ref 8.9–10.3)
CO2: 19 mmol/L — AB (ref 22–32)
Chloride: 109 mmol/L (ref 101–111)
Creatinine, Ser: 1.4 mg/dL — ABNORMAL HIGH (ref 0.44–1.00)
GFR calc Af Amer: 40 mL/min — ABNORMAL LOW (ref >60)
GFR, EST NON AFRICAN AMERICAN: 34 mL/min — AB (ref >60)
GLUCOSE: 142 mg/dL — AB (ref 65–99)
POTASSIUM: 3.6 mmol/L (ref 3.5–5.1)
Sodium: 141 mmol/L (ref 135–145)

## 2015-09-11 LAB — APTT: aPTT: 33 seconds (ref 24–36)

## 2015-09-11 MED ORDER — ENOXAPARIN SODIUM 30 MG/0.3ML ~~LOC~~ SOLN
30.0000 mg | SUBCUTANEOUS | Status: DC
  Administered 2015-09-13: 30 mg via SUBCUTANEOUS
  Filled 2015-09-11: qty 0.3

## 2015-09-11 MED ORDER — STROKE: EARLY STAGES OF RECOVERY BOOK
Freq: Once | Status: AC
  Administered 2015-09-11: 1
  Filled 2015-09-11 (×2): qty 1

## 2015-09-11 MED ORDER — JEVITY 1.2 CAL PO LIQD
1000.0000 mL | ORAL | Status: DC
  Filled 2015-09-11 (×3): qty 1000

## 2015-09-11 NOTE — Evaluation (Signed)
Physical Therapy Evaluation Patient Details Name: Layci Centofanti MRN: [id number] DOB: 04-Feb-1932 Today's Date: 09/11/2015   History of Present Illness  Pt admitted from SNF with lethargy and oral intake. MRI revealed B strokes, largest in parietal region. PMH: recent thalamic CVA with residual R side weakness, HTN, DM, HLD, CKD. Palliative medicine involved in pt's care.  Clinical Impression  Pt admitted with above diagnosis and resulting functional limitations with mobility. At this time the pt is requiring +2 total assistance for bed mobility. She was able to sit briefly without assistance and inconsistently following commands. No active motion was noted through Rt UE or LE. Based upon the patient's current mobility, recommending D/C to SNF for further care and rehabilitation services. PT will continue to follow.        Follow Up Recommendations SNF;Supervision/Assistance - 24 hour    Equipment Recommendations   (to be addressed at next venue)    Recommendations for Other Services       Precautions / Restrictions Precautions Precautions: Fall Precaution Comments: dense R hemiplegia Restrictions Weight Bearing Restrictions: No      Mobility  Bed Mobility Overal bed mobility: +2 for physical assistance;Needs Assistance Bed Mobility: Rolling;Supine to Sit;Sit to Supine Rolling: Total assist (pt atteempting to use LUE to assist)   Supine to sit: +2 for physical assistance;Total assist Sit to supine: +2 for physical assistance;Total assist   General bed mobility comments: assist at LEs and trunk to pivot to sitting EOB.   Transfers                 General transfer comment: not safe to attempt at this time.   Ambulation/Gait                Stairs            Wheelchair Mobility    Modified Rankin (Stroke Patients Only) Modified Rankin (Stroke Patients Only) Pre-Morbid Rankin Score: Severe disability Modified Rankin: Severe disability     Balance  Overall balance assessment: Needs assistance Sitting-balance support: No upper extremity supported Sitting balance-Leahy Scale: Poor Sitting balance - Comments: initially requiring max assist but improving to min guard with single UE support by pt and periods of no UE support. Pt with tendency for posterior/right LOB.  Postural control: Posterior lean;Right lateral lean                                   Pertinent Vitals/Pain Pain Assessment: Faces Faces Pain Scale: Hurts little more Pain Location: Rt foot Pain Descriptors / Indicators: Grimacing Pain Intervention(s): Monitored during session (elicited with donning sock)    Home Living Family/patient expects to be discharged to:: Skilled nursing facility                      Prior Function Level of Independence: Needs assistance         Comments: friend reports pt was dependent for all care prior to admission.     Hand Dominance       Extremity/Trunk Assessment   Upper Extremity Assessment: Defer to OT evaluation      Lower Extremity Assessment: RLE deficits/detail RLE Deficits / Details: no active motion noted throughout session. Noted limitation in ankle dorsiflexion, knee and hip flexion but improving with PROM.     Cervical / Trunk Assessment: Kyphotic  Communication   Communication: HOH  Cognition Arousal/Alertness: Awake/alert Behavior During Therapy: WFL for  tasks assessed/performed Overall Cognitive Status: Difficult to assess Area of Impairment: Following commands       Following Commands: Follows one step commands inconsistently       General Comments: pt is non verbal, smiles only    General Comments General comments (skin integrity, edema, etc.): Pt turning head and tracking to Rt side but not through full visual field. Difficult to assess due to inconsistency with following commands.     Exercises Other Exercises Other Exercises: PROM- Rt knee/hip flexion with improving  range. Pt grimacing at end range.       Assessment/Plan    PT Assessment Patient needs continued PT services  PT Diagnosis Difficulty walking;Generalized weakness;Hemiplegia dominant side   PT Problem List Decreased strength;Decreased range of motion;Decreased activity tolerance;Decreased balance;Decreased mobility  PT Treatment Interventions DME instruction;Functional mobility training;Therapeutic exercise;Therapeutic activities;Balance training;Neuromuscular re-education;Patient/family education;Wheelchair mobility training   PT Goals (Current goals can be found in the Care Plan section) Acute Rehab PT Goals Patient Stated Goal: not expressed PT Goal Formulation: Patient unable to participate in goal setting Time For Goal Achievement: 09/25/15 Potential to Achieve Goals: Fair    Frequency Min 3X/week   Barriers to discharge        Co-evaluation PT/OT/SLP Co-Evaluation/Treatment: Yes Reason for Co-Treatment: Complexity of the patient's impairments (multi-system involvement) PT goals addressed during session: Mobility/safety with mobility OT goals addressed during session: Strengthening/ROM       End of Session   Activity Tolerance: Patient limited by fatigue (attempting to lay down ) Patient left: in bed;with call bell/phone within reach;with family/visitor present Nurse Communication: Mobility status         Time: 1422-1450 PT Time Calculation (min) (ACUTE ONLY): 28 min   Charges:   PT Evaluation $PT Eval Moderate Complexity: 1 Procedure     PT G Codes:        Cassell Clement, PT, CSCS Pager [phone] Office [phone]  09/11/2015, 3:45 PM

## 2015-09-11 NOTE — Progress Notes (Signed)
Patient ID: Paula West, female   DOB: [birth year], 82 y.o.   MRN: [id number]   For possible percutaneous gastric tube placement in IR  Will keep NPO and HOLD Lovenox for 8/25  Await to hear from Dr Sheran Fava as to if moving forward

## 2015-09-11 NOTE — Progress Notes (Signed)
SLP Cancellation Note  Patient Details Name: Paula West MRN: [id number] DOB: 07/03/1932   Cancelled treatment:       Reason Eval/Treat Not Completed: Noted plan for PEG. Given lack of interest in PO, will defer further SLP at this time. Pt may benefit from SLP in the future.   Vinetta Bergamo MA CCC-SLP Pager [phone] 09/11/2015, 11:41 AM

## 2015-09-11 NOTE — Consult Note (Signed)
Requesting Physician: Dr. Sheran Fava    Chief Complaint:  Stroke  History obtained from:   Chart     HPI:                                                                                                                                         Paula West is an 82 y.o. female  with recent CVA few weeks ago with residual right sided weakness, DM, CKD 3, COPD, poorly communicative at baseline is being brought to the ER from SNF due to decreased responsiveness and lethargy. She had a fever few days ago and was started on antibiotics at [facility] for a UTI. MRI obtained showed bilateral strokes with largest in left parietal region. She is being followed by palliative care and per their note, "DNR, - Desire feeding tube, likely will need PEG - will try and confirm decisions with brother - Desire continued treatment of reversible conditions and to prolong life for now".  Recent stroke work up on 6/24 showed   Echo: Study Conclusions  - Left ventricle: The cavity size was normal. Wall thickness was   increased in a pattern of mild LVH. Systolic function was normal.   The estimated ejection fraction was in the range of 55% to 60%.   Wall motion was normal; there were no regional wall motion   abnormalities. Doppler parameters are consistent with abnormal   left ventricular relaxation (grade 1 diastolic dysfunction). - Atrial septum: No defect or patent foramen ovale was identified.   Carotid doppler: Bilateral: intimal wall thickening CCA. Mild soft plaque origin ICA. 1-39% ICA plaquing. Vertebral arteries not insonated secondary to patient&'s body habitus.  Currently she is mute but able to mimic commands. On her left side.   Date last known well: Unable to determine Time last known well: Unable to determine tPA Given: No: recent stroke   Past Medical History:  Diagnosis Date  . Asthma   . CKD (chronic kidney disease), stage II   . COPD (chronic obstructive pulmonary disease) ([place])   .  Diabetes mellitus   . Hyperlipidemia   . Hypertension     History reviewed. No pertinent surgical history.  Family History  Problem Relation Age of Onset  . Diabetes Sister   . Stroke Mother   . CAD Neg Hx    Social History:  reports that she has quit smoking. She has quit using smokeless tobacco. She reports that she does not drink alcohol or use drugs.  Allergies:  Allergies  Allergen Reactions  . Codeine Rash  . Darvon [Propoxyphene] Rash  . Penicillins Rash    Has patient had a PCN reaction causing immediate rash, facial/tongue/throat swelling, SOB or lightheadedness with hypotension: Yes Has patient had a PCN reaction causing severe rash involving mucus membranes or skin necrosis: No Has patient had a PCN reaction that required hospitalization No Has patient had a PCN  reaction occurring within the last 10 years: No If all of the above answers are "NO", then may proceed with Cephalosporin use.     Medications:                                                                                                                           Prior to Admission:  Prescriptions Prior to Admission  Medication Sig Dispense Refill Last Dose  . albuterol (PROAIR HFA) 108 (90 Base) MCG/ACT inhaler Inhale 2 puffs into the lungs every 6 (six) hours as needed for wheezing or shortness of breath.   Unknown at Unknown  . aspirin 81 MG tablet Take 1 tablet (81 mg total) by mouth daily. 30 tablet 1 09/07/2015 at Unknown time  . atorvastatin (LIPITOR) 40 MG tablet Take 1 tablet (40 mg total) by mouth daily at 6 PM. 30 tablet 1 09/06/2015 at Unknown time  . carvedilol (COREG) 12.5 MG tablet Take 1 tablet (12.5 mg total) by mouth 2 (two) times daily with a meal. 60 tablet 1 09/07/2015 at 0800  . CEFTRIAXONE SODIUM IV Inject 1 g into the vein daily.   09/07/2015 at Unknown time  . clopidogrel (PLAVIX) 75 MG tablet Take 1 tablet (75 mg total) by mouth daily. 30 tablet 1 09/07/2015 at Unknown time  .  colchicine (COLCRYS) 0.6 MG tablet Take 0.6 mg by mouth 2 (two) times daily.    09/07/2015 at Unknown time  . gabapentin (NEURONTIN) 300 MG capsule Take 300 mg by mouth 3 (three) times daily.   09/07/2015 at Unknown time  . linagliptin (TRADJENTA) 5 MG TABS tablet Take 5 mg by mouth daily.   09/07/2015 at Unknown time  . losartan-hydrochlorothiazide (HYZAAR) 100-25 MG tablet Take 1 tablet by mouth daily.   09/07/2015 at Unknown time  . omeprazole (PRILOSEC) 20 MG capsule Take 2 capsules (40 mg total) by mouth 2 (two) times daily before a meal. 120 capsule 1 09/07/2015 at Unknown time  . prednisoLONE acetate (PRED FORTE) 1 % ophthalmic suspension Place 1 drop into both eyes 4 (four) times daily.   09/07/2015 at Unknown time  . ranitidine (ZANTAC) 300 MG tablet Take 1 tablet (300 mg total) by mouth at bedtime. 30 tablet 1 09/06/2015 at Unknown time  . sucralfate (CARAFATE) 1 GM/10ML suspension Take 10 mLs (1 g total) by mouth 3 (three) times daily. 420 mL 1 09/07/2015 at Unknown time  . traMADol (ULTRAM) 50 MG tablet Take 50 mg by mouth every 12 (twelve) hours as needed for moderate pain.   Unknown at Unknown   Scheduled: . aspirin  300 mg Rectal Daily  . enoxaparin (LOVENOX) injection  30 mg Subcutaneous Q24H  . insulin aspart  0-9 Units Subcutaneous Q4H  . prednisoLONE acetate  1 drop Both Eyes QID    ROS:  History obtained from chart review  General ROS: negative for - chills, fatigue, fever, night sweats, weight gain or weight loss Psychological ROS: negative for - behavioral disorder, hallucinations, memory difficulties, mood swings or suicidal ideation Ophthalmic ROS: negative for - blurry vision, double vision, eye pain or loss of vision ENT ROS: negative for - epistaxis, nasal discharge, oral lesions, sore throat, tinnitus or vertigo Allergy and Immunology ROS:  negative for - hives or itchy/watery eyes Hematological and Lymphatic ROS: negative for - bleeding problems, bruising or swollen lymph nodes Endocrine ROS: negative for - galactorrhea, hair pattern changes, polydipsia/polyuria or temperature intolerance Respiratory ROS: negative for - cough, hemoptysis, shortness of breath or wheezing Cardiovascular ROS: negative for - chest pain, dyspnea on exertion, edema or irregular heartbeat Gastrointestinal ROS: negative for - abdominal pain, diarrhea, hematemesis, nausea/vomiting or stool incontinence Genito-Urinary ROS: negative for - dysuria, hematuria, incontinence or urinary frequency/urgency Musculoskeletal ROS: negative for - joint swelling or muscular weakness Neurological ROS: as noted in HPI Dermatological ROS: negative for rash and skin lesion changes  Neurologic Examination:                                                                                                      Blood pressure 140/69, pulse (!) 119, temperature 97.8 F (36.6 C), temperature source Oral, resp. rate 20, height 5\' 4"  (1.626 m), weight 67.1 kg (148 lb), SpO2 100 %.  HEENT-  Normocephalic, no lesions, without obvious abnormality.  Normal external eye and conjunctiva.  Normal TM's bilaterally.  Normal auditory canals and external ears. Normal external nose, mucus membranes and septum.  Normal pharynx. Cardiovascular- S1, S2 normal, pulses palpable throughout   Lungs- chest clear, no wheezing, rales, normal symmetric air entry Abdomen- normal findings: bowel sounds normal Extremities- no edema Lymph-no adenopathy palpable Musculoskeletal-no joint tenderness, deformity or swelling Skin-warm and dry, no hyperpigmentation, vitiligo, or suspicious lesions  Neurological Examination Mental Status: Alert, mimics commands on the left, does not follow verbal commands.  Cranial Nerves: II: visual field difficult to assess as she does not blink to threat on both sides but able  look both way when I talk to her from a specific side. , pupils equal, round, reactive to light and accommodation III,IV, VI: ptosis not present, extra-ocular motions intact bilaterally V,VII: smile asymmetric on the right, facial light touch sensation normal bilaterally VIII: hearing normal bilaterally  Motor: Right : Upper extremity   0/5    Left:     Upper extremity   4/5  Lower extremity   0/5     Lower extremity   3/5 --withdrawal to pain Tone and bulk:normal tone throughout; no atrophy noted Sensory: Pinprick and light touch intact throughout, bilaterally Deep Tendon Reflexes: 1+ and symmetric throughout Plantars: Right: downgoing   Left: downgoing Cerebellar: Unable to assess Gait: not tested       Lab Results: Basic Metabolic Panel:  Recent Labs Lab 09/07/15 1320 09/07/15 1918 09/08/15 0540 09/09/15 0442 09/10/15 0517 09/11/15 0811  NA 138  --  142 142 140 141  K 3.8  --  3.7 3.3* 3.1* 3.6  CL 111  --  113* 114* 111 109  CO2 18*  --  17* 18* 18* 19*  GLUCOSE 197*  --  109* 113* 129* 142*  BUN 63*  --  45* 26* 20 19  CREATININE 2.02*  --  1.61* 1.30* 1.28* 1.40*  CALCIUM 9.0  --  9.0 8.6* 8.9 8.9  MG  --  2.0  --   --   --   --   PHOS  --  2.2*  --   --   --   --     Liver Function Tests:  Recent Labs Lab 09/07/15 1320  AST 22  ALT 25  ALKPHOS 113  BILITOT 0.7  PROT 6.6  ALBUMIN 2.9*   No results for input(s): LIPASE, AMYLASE in the last 168 hours.  Recent Labs Lab 09/10/15 0409  AMMONIA 25    CBC:  Recent Labs Lab 09/07/15 1320 09/08/15 0540 09/10/15 0517 09/11/15 0811  WBC 8.8 9.0 12.2* 10.3  NEUTROABS 6.0  --   --   --   HGB 13.5 14.1 12.5 14.8  HCT 41.4 43.3 38.5 44.6  MCV 80.7 80.0 79.4 78.9  PLT 130* 121* 170 126*    Cardiac Enzymes: No results for input(s): CKTOTAL, CKMB, CKMBINDEX, TROPONINI in the last 168 hours.  Lipid Panel: No results for input(s): CHOL, TRIG, HDL, CHOLHDL, VLDL, LDLCALC in the last 168  hours.  CBG:  Recent Labs Lab 09/10/15 2006 09/11/15 0007 09/11/15 0417 09/11/15 0814 09/11/15 1226  GLUCAP 97 116* 158* 157* 177*    Microbiology: Results for orders placed or performed during the hospital encounter of 09/07/15  Urine culture     Status: Abnormal   Collection Time: 09/07/15 12:26 PM  Result Value Ref Range Status   Specimen Description URINE, CATHETERIZED  Final   Special Requests NONE  Final   Culture 50,000 COLONIES/mL ESCHERICHIA COLI (A)  Final   Report Status 09/09/2015 FINAL  Final   Organism ID, Bacteria ESCHERICHIA COLI (A)  Final      Susceptibility   Escherichia coli - MIC*    AMPICILLIN <=2 SENSITIVE Sensitive     CEFAZOLIN <=4 SENSITIVE Sensitive     CEFTRIAXONE <=1 SENSITIVE Sensitive     CIPROFLOXACIN <=0.25 SENSITIVE Sensitive     GENTAMICIN <=1 SENSITIVE Sensitive     IMIPENEM <=0.25 SENSITIVE Sensitive     NITROFURANTOIN <=16 SENSITIVE Sensitive     TRIMETH/SULFA <=20 SENSITIVE Sensitive     AMPICILLIN/SULBACTAM <=2 SENSITIVE Sensitive     PIP/TAZO <=4 SENSITIVE Sensitive     Extended ESBL NEGATIVE Sensitive     * 50,000 COLONIES/mL ESCHERICHIA COLI  Culture, blood (Routine x 2)     Status: None (Preliminary result)   Collection Time: 09/07/15  1:20 PM  Result Value Ref Range Status   Specimen Description BLOOD RIGHT FOREARM  Final   Special Requests IN PEDIATRIC BOTTLE  3CC  Final   Culture NO GROWTH 3 DAYS  Final   Report Status PENDING  Incomplete  Culture, blood (Routine x 2)     Status: None (Preliminary result)   Collection Time: 09/07/15  1:38 PM  Result Value Ref Range Status   Specimen Description BLOOD LEFT HAND  Final   Special Requests IN PEDIATRIC BOTTLE  1CC  Final   Culture NO GROWTH 3 DAYS  Final   Report Status PENDING  Incomplete  MRSA PCR Screening     Status: None   Collection  Time: 09/07/15  6:12 PM  Result Value Ref Range Status   MRSA by PCR NEGATIVE NEGATIVE Final    Comment:        The GeneXpert MRSA  Assay (FDA approved for NASAL specimens only), is one component of a comprehensive MRSA colonization surveillance program. It is not intended to diagnose MRSA infection nor to guide or monitor treatment for MRSA infections.     Coagulation Studies:  Recent Labs  09/11/15 1116  LABPROT 15.7*  INR 1.24    Imaging: Mr Brain Wo Contrast  Result Date: 09/10/2015 CLINICAL DATA:  82 y/o F; patient presenting with decreased responsiveness and lethargy being treated for urinary tract infection. History of recent cerebrovascular accident, diabetes, chronic kidney disease, and COPD. EXAM: MRI HEAD WITHOUT CONTRAST TECHNIQUE: Multiplanar, multiecho pulse sequences of the brain and surrounding structures were obtained without intravenous contrast. COMPARISON:  CT head 08/22/2015 and MR head 07/11/2015 FINDINGS: Brain: Small area of diffusion restriction in the left posterior and lateral frontal low consistent with acute/ early subacute infarct. There is associated mild T2 FLAIR hyperintensity. There are several additional punctate infarcts scattered throughout the left frontal and parietal lobes, within the left thalamus, left occipital lobe, right caudate head, right anterior frontal periventricular white matter, right posterior parietal lobe, right occipital lobe cortex, and within the left greater than right cerebellar hemispheres. The susceptibility weighted sequence is severely motion degraded, there is no large gross focus of susceptibility hypointensity to suggest hemorrhage. There is extensive confluent T2 FLAIR hyperintense signal abnormality in supratentorial white matter compatible with advanced chronic microvascular ischemic changes. There is moderate parenchymal volume loss. Extra-axial space: Ventriculomegaly appears disproportional to volume loss probably representing idiopathic normal pressure hydrocephalus. No midline shift. No effacement of basilar cisterns. No extra-axial collection is  identified. Proximal intracranial flow voids are maintained. No abnormality of the cervical medullary junction. Other: No abnormal signal of the paranasal sinuses. No abnormal signal of the mastoid air cells. Bilateral intra-ocular lens replacement. Calvarium is unremarkable. IMPRESSION: 1. Multiple areas of acute/early subacute infarct in multiple vascular territories with the largest lesion in the left posterior frontal lobe involving the precentral gyrus. No associated hemorrhage is identified. This is probably related to embolic disease given multiple vascular territories. 2. Stable ventriculomegaly that appears disproportional to volume loss, possibly representing idiopathic normal pressure hydrocephalus. 3. Advanced chronic microvascular ischemic changes and moderate brain parenchymal volume loss. These results will be called to the ordering clinician or representative by the Radiologist Assistant, and communication documented in the PACS or zVision Dashboard. Electronically Signed   By: Kristine Garbe M.D.   On: 09/10/2015 23:39     Assessment and plan discussed with with attending physician and they are in agreement.    Etta Quill PA-C [facility] [phone]  09/11/2015, 12:36 PM   Assessment: 82 y.o. female with previous left thalamic lacunar infarct back in June. Stroke workup at that time was unrevealing. She returns the hospital secondary to altered mental status. MRI was obtained and revealed bilateral, multiple vascular territory and most likely cardiogenic strokes. With the largest stroke being in the left parietal region. Patient was on aspirin and Plavix at that time. Patient does not show A. fib on her monitor or history. At this time it is unknown if she is having PAF. And a Holter monitor may help determine this. Given her recent stroke workup was unrevealing and less than 3 months ago is no need to repeat the whole stroke workup. Would consider obtaining a  echocardiogram and possibly TEE if cardiology and stroke at the end this is warranted. Would continue her aspirin and Plavix.  Further recommendations are to be made by Dr. Tasia Catchings.  Stroke Risk Factors - diabetes mellitus, hyperlipidemia and hypertension    Neurology attending:  Shanon Brow and I have both evaluated Wells Guiles. She is an 82 year old patient with a known history of prior stroke. She is already undergone a complete evaluation for ischemia. She is on appropriate prophylactic therapy. Her risk factors are being appropriately addressed at this time. Unfortunately she will remain at high risk for additional stroke simply do to her age and additional risk factors. At this point there are no additional interventions that will be of any additional benefit to her back. As Shanon Brow has mentioned, consideration could be given to evaluation for paroxysmal atrial fibrillation. However, this would require placing Hikari on additional anticoagulation such as Coumadin. It is unclear if the risk of that medication outweigh the benefits.  Given the details of this case, it would appear that continuation of her present therapy is the most appropriate course of action. This was discussed with her husband. However, her husband appears to have a moderate degree of dementia. In fact, when a family member entered the room he asked if it was one of my assistants.  Plan:  1. Continue present therapy.  2. No further recommendations. Neurology will sign off at this time. If there are any new neurological issues please contact us.   Elson Clan, M.D. Neurohospitalist

## 2015-09-11 NOTE — Evaluation (Signed)
Occupational Therapy Evaluation Patient Details Name: Paula West MRN: [id number] DOB: 1932-03-15 Today's Date: 09/11/2015    History of Present Illness Pt admitted from SNF with lethargy and oral intake. MRI revealed B strokes, largest in parietal region. PMH: recent thalamic CVA with residual R side weakness, HTN, DM, HLD, CKD. Palliative medicine involved in pt's care.   Clinical Impression   Pt presents with dense R hemiplegia, visual field cut and R inattention, poor balance, decreased activity tolerance and impaired cognition. She is non-verbal. Pt requires total assist for ADL and 2 person assist for bed level mobility. Plan is for pt to return to SNF. Will follow acutely.    Follow Up Recommendations  SNF;Supervision/Assistance - 24 hour    Equipment Recommendations       Recommendations for Other Services       Precautions / Restrictions Precautions Precautions: Fall Precaution Comments: dense R hemiplegia Restrictions Weight Bearing Restrictions: No      Mobility Bed Mobility Overal bed mobility: +2 for physical assistance;Needs Assistance Bed Mobility: Rolling;Supine to Sit;Sit to Supine Rolling: (P) Total assist (pt atteempting to use LUE to assist)   Supine to sit: (P) +2 for physical assistance;Total assist Sit to supine: (P) +2 for physical assistance;Total assist   General bed mobility comments: (P) assist at LEs and trunk to pivot to sitting EOB.   Transfers                 General transfer comment: (P) not safe to attempt at this time.     Balance Overall balance assessment: (P) Needs assistance Sitting-balance support: Feet supported Sitting balance-Leahy Scale: Poor   Postural control: Posterior lean;Right lateral lean                                  ADL                                         General ADL Comments: pt requires total assist, poor handling of secretions, performed L hand to mouth x  1 with physical assist to initiate     Vision Additional Comments: appears to have R inattention and field cut   Perception     Praxis      Pertinent Vitals/Pain Pain Assessment: Faces Faces Pain Scale: Hurts little more Pain Location: Rt foot Pain Descriptors / Indicators: Grimacing Pain Intervention(s): Monitored during session (elicited with donning sock)     Hand Dominance Right   Extremity/Trunk Assessment Upper Extremity Assessment Upper Extremity Assessment: Defer to OT evaluation RUE Deficits / Details: no voluntary movement, full PROM, mild flexor tone, no indication of pain with handling, positioned on pillow RUE Coordination: decreased fine motor;decreased gross motor LUE Deficits / Details: generalized weakness, full AROM   Lower Extremity Assessment Lower Extremity Assessment: RLE deficits/detail RLE Deficits / Details: no active motion noted throughout session. Noted limitation in ankle dorsiflexion, knee and hip flexion but improving with PROM.    Cervical / Trunk Assessment Cervical / Trunk Assessment: Kyphotic   Communication Communication Communication: HOH   Cognition Arousal/Alertness: Awake/alert Behavior During Therapy: WFL for tasks assessed/performed Overall Cognitive Status: Difficult to assess Area of Impairment: Following commands       Following Commands: Follows one step commands inconsistently       General Comments: pt is non verbal, smiles  only   General Comments       Exercises       Shoulder Instructions      Home Living Family/patient expects to be discharged to:: Skilled nursing facility                                        Prior Functioning/Environment Level of Independence: Needs assistance        Comments: friend reports pt was dependent for all care prior to admission.    OT Diagnosis: Generalized weakness;Cognitive deficits;Hemiplegia dominant side;Acute pain;Disturbance of vision   OT  Problem List: Decreased strength;Decreased activity tolerance;Impaired balance (sitting and/or standing);Impaired vision/perception;Decreased coordination;Decreased cognition;Impaired UE functional use;Pain   OT Treatment/Interventions: Self-care/ADL training;Neuromuscular education;DME and/or AE instruction;Cognitive remediation/compensation;Therapeutic activities;Visual/perceptual remediation/compensation;Patient/family education;Balance training    OT Goals(Current goals can be found in the care plan section) Acute Rehab OT Goals OT Goal Formulation: Patient unable to participate in goal setting Time For Goal Achievement: 09/25/15 Potential to Achieve Goals: Fair ADL Goals Additional ADL Goal #1: Pt will follow one step commands with multimodal cues 50% of time. Additional ADL Goal #2: Pt will sit EOB x 10 minutes with min assist in preparation for ADL. Additional ADL Goal #3: Pt will roll with moderate assistance for positioning and ADL. Additional ADL Goal #4: Caregivers will be independent in PROM R UE and positioning in bed and chair to protect from injury/deformity.  OT Frequency: Min 2X/week   Barriers to D/C:            Co-evaluation PT/OT/SLP Co-Evaluation/Treatment: Yes Reason for Co-Treatment: Complexity of the patient's impairments (multi-system involvement) PT goals addressed during session: Mobility/safety with mobility OT goals addressed during session: Strengthening/ROM      End of Session    Activity Tolerance: Patient limited by fatigue Patient left: in bed;with call bell/phone within reach;with family/visitor present   Time: [id number] OT Time Calculation (min): 27 min Charges:  OT General Charges $OT Visit: 1 Procedure OT Evaluation $OT Eval Moderate Complexity: 1 Procedure G-Codes:    Malka So 09/11/2015, 3:39 PM  [phone]

## 2015-09-11 NOTE — Progress Notes (Addendum)
Text paged [facility] about pt's CBG resulting > 180. (Per MD order to notify). Continue SSI. No other new orders. Continue current IVF.

## 2015-09-11 NOTE — Progress Notes (Signed)
PROGRESS NOTE  Brettany Sydney  SKA:[id number] DOB: [birth year] DOA: 09/07/2015 PCP: No PCP Per Patient  Brief Narrative:   Paula West is an 82 y.o. female who suffered an acute left thalamic infarct in late June.  She was hospitalized a few weeks later for chest pain and elevated troponin at which time plavix was added.  She was rehospitalized in early August for changes in speech and increased lethargy at which time she was found to be dehydrated with AKI with a creatinine of 4.  She was rehydrated and discharged to home.  She met with Dr. Jaynee Eagles, Neurology on 8/15, who noted that she had not had any improvement in her stroke symptoms and in fact had increasing confusion, decreased oral intake, and he was concerned that she may need a feeding tube.  She was admitted 09/07/15 from SNF due to decreased responsiveness and lethargy.  She had recently been started on antibiotics at SNF for a UTI. Upon initial presentation, the patient was found to have acute kidney injury with a creatinine of 2.  Since admission she has not had anything to eat or drink. She met with speech therapy who recommended a dysphagia 1 diet with thin liquids, however she refuses to eat. Palliative care was consulted and met with the family today. After prolonged discussion, she was transitioned to DO NOT RESUSCITATE status.  After reviewing records on 8/23, notes suggested that she had been speaking and function  Assessment & Plan:   Principal Problem:   AKI (acute kidney injury) ([place]) Active Problems:   COPD (chronic obstructive pulmonary disease) ([place])   Hyperlipidemia   Diabetes mellitus, controlled ([place])   Essential hypertension   Thalamic infarction (June 2017 w/ residual dysarthria/FTT)   CKD (chronic kidney disease), stage III   Dehydration   UTI (lower urinary tract infection)   Metabolic encephalopathy   Recent NSTEMI (non-ST elevated myocardial infarction-July 2017) ([facility])   Right Adrenal nodule ([facility])  Altered mental status   Protein-calorie malnutrition, severe   Palliative care encounter   DNR (do not resuscitate)   Poor appetite   Dysphagia   Lethargy, metabolic encephalopathy due to UTI, dehydration, and acute/subacute strokes superimposed on recent thalamic infarct in June.  She is unable to communicate or eat safely.  Right hemiparesis.  Also MRI from 8/23 were discussed with the daughter [name] today by phone. -  MRI brain:  Multiple areas of acute and early subacute infarct in multiple vascular territories with the largest lesion in the left posterior frontal lobe involving the precentral gyrus. Findings suggestive of embolic disease. -  Continue rectal aspirin -  PT/OT recommending skilled nursing -  Ongoing speech evaluations -  Neurology consultation -  MRA, echocardiogram, carotid duplex were completed 2 months ago -  Telemetry: No evidence of atrial fibrillation -  Family may be interested in hospice care  Dysphagia with severe protein calorie malnutrition -  Holding off on gastrostomy tube placement pending further discussions with the family about goals of care  AKI (acute kidney injury) secondary to dehydration, in the setting of stage III CKD -  Continue to hold diuretic and ARB -  Continue IV fluids  Hypokalemia, add potassium to IVF  UTI (lower urinary tract infection) -  Discontinue ciprofloxacin as this can worsen encephalopathy and do not want to confound   COPD (chronic obstructive pulmonary disease), stable, continue bronchodilators as needed.  Diabetes mellitus, controlled  -  Hold Tradjenta  - continue SSI - A1c was 8.4 on  7/15  Recent NSTEMI (non-ST elevated myocardial infarction-July 2017)  resume statin, beta blocker, and Plavix once feeding tube placed  Essential hypertension Current blood pressures well controlled.  Right Adrenal nodule, 3cm with indeterminate Hounsfield unit measurements and poorly defined margins -   Outpatient MRI  Enlarged left ovary -  Outpatient pelvic US  DVT prophylaxis:  lovenox Code Status:  DNR Family Communication:  Spoke at length with patient's daughter Cyril Loosen. We discussed the results of the MRI and the likelihood that she could have another stroke. I suspect that this Azharia Surratt will be permanently disabled with limited ability to communicate. The family will discuss goals of care and reconsider gastrostomy tube placement. Elmo Putt from palliative care has communicated with the family again today to discuss the alternative of hospice and will touch base with the family again tomorrow.  I suspect that the family does not understand that if we elect hospice care and comfort feeding that their mother would likely be dehydrated within a few weeks of discharge. I suspect that they would request that she be readmitted to the hospital for IV fluids. In this instance, it may be best to pursue gastrostomy tube if the family does not demonstrate that they have a clear understanding of the implications of entering hospice care. Disposition Plan:  To SNF either with gastrostomy tube with goals of care to include physical and occupational therapy versus to skilled nursing facility with palliative care and comfort feeding  Consultants:   Palliative care  Procedures:  none  Antimicrobials:   Ciprofloxacin 8/21 - 8.24   Subjective: Unable to communicate  Objective: Vitals:   09/11/15 0430 09/11/15 1000 09/11/15 1200 09/11/15 1400  BP: (!) 145/84 140/69 (!) 164/70 (!) 166/75  Pulse: 72 (!) 119 60 65  Resp: 17 20 16 16   Temp: 98.2 F (36.8 C) 97.8 F (36.6 C) 98.1 F (36.7 C) 98 F (36.7 C)  TempSrc: Axillary Oral Oral Oral  SpO2: 97% 100% 100% 96%  Weight: 67.1 kg (148 lb)     Height:        Intake/Output Summary (Last 24 hours) at 09/11/15 1638 Last data filed at 09/11/15 1300  Gross per 24 hour  Intake              485 ml  Output                0 ml  Net               485 ml   Filed Weights   09/07/15 1203 09/09/15 0700 09/11/15 0430  Weight: 74.8 kg (165 lb) 71.3 kg (157 lb 1.6 oz) 67.1 kg (148 lb)    Examination:  General exam:  Adult female, slumped to the right in bed.  Right facial droop.  No acute distress. Able to follow a few simple commands including sticking out her tongue and wiggling the fingers on her left hand. She smiled at me. HEENT:  NCAT, MMM Respiratory system: Clear to auscultation bilaterally Cardiovascular system: Regular rate and rhythm, normal S1/S2. No murmurs, rubs, gallops or clicks.  Warm extremities Gastrointestinal system: Normal active bowel sounds, soft, nondistended, nontender. MSK:  Decreased tone and bulk, no lower extremity edema Neuro:  Mild right facial droop.  Difficulty looking to the right.  Ignores right hand and right leg.  Flaccid right arm.  2 out of 5 strength of the right lower extremity.  Moves left arm and left leg spontaneously.  Data Reviewed: I have personally reviewed following labs and imaging studies  CBC:  Recent Labs Lab 09/07/15 1320 09/08/15 0540 09/10/15 0517 09/11/15 0811  WBC 8.8 9.0 12.2* 10.3  NEUTROABS 6.0  --   --   --   HGB 13.5 14.1 12.5 14.8  HCT 41.4 43.3 38.5 44.6  MCV 80.7 80.0 79.4 78.9  PLT 130* 121* 170 026*   Basic Metabolic Panel:  Recent Labs Lab 09/07/15 1320 09/07/15 1918 09/08/15 0540 09/09/15 0442 09/10/15 0517 09/11/15 0811  NA 138  --  142 142 140 141  K 3.8  --  3.7 3.3* 3.1* 3.6  CL 111  --  113* 114* 111 109  CO2 18*  --  17* 18* 18* 19*  GLUCOSE 197*  --  109* 113* 129* 142*  BUN 63*  --  45* 26* 20 19  CREATININE 2.02*  --  1.61* 1.30* 1.28* 1.40*  CALCIUM 9.0  --  9.0 8.6* 8.9 8.9  MG  --  2.0  --   --   --   --   PHOS  --  2.2*  --   --   --   --    GFR: Estimated Creatinine Clearance: 30.2 mL/min (by C-G formula based on SCr of 1.4 mg/dL). Liver Function Tests:  Recent Labs Lab 09/07/15 1320  AST 22  ALT 25    ALKPHOS 113  BILITOT 0.7  PROT 6.6  ALBUMIN 2.9*   No results for input(s): LIPASE, AMYLASE in the last 168 hours.  Recent Labs Lab 09/10/15 0409  AMMONIA 25   Coagulation Profile:  Recent Labs Lab 09/11/15 1116  INR 1.24   Cardiac Enzymes: No results for input(s): CKTOTAL, CKMB, CKMBINDEX, TROPONINI in the last 168 hours. BNP (last 3 results) No results for input(s): PROBNP in the last 8760 hours. HbA1C: No results for input(s): HGBA1C in the last 72 hours. CBG:  Recent Labs Lab 09/11/15 0007 09/11/15 0417 09/11/15 0814 09/11/15 1226 09/11/15 1237  GLUCAP 116* 158* 157* 177* 179*   Lipid Profile: No results for input(s): CHOL, HDL, LDLCALC, TRIG, CHOLHDL, LDLDIRECT in the last 72 hours. Thyroid Function Tests: No results for input(s): TSH, T4TOTAL, FREET4, T3FREE, THYROIDAB in the last 72 hours. Anemia Panel: No results for input(s): VITAMINB12, FOLATE, FERRITIN, TIBC, IRON, RETICCTPCT in the last 72 hours. Urine analysis:    Component Value Date/Time   COLORURINE YELLOW 09/07/2015 1226   APPEARANCEUR TURBID (A) 09/07/2015 1226   LABSPEC 1.020 09/07/2015 1226   PHURINE 5.0 09/07/2015 1226   GLUCOSEU 100 (A) 09/07/2015 1226   HGBUR LARGE (A) 09/07/2015 1226   BILIRUBINUR NEGATIVE 09/07/2015 1226   KETONESUR NEGATIVE 09/07/2015 1226   PROTEINUR 30 (A) 09/07/2015 1226   UROBILINOGEN 0.2 06/22/2010 1226   NITRITE NEGATIVE 09/07/2015 1226   LEUKOCYTESUR LARGE (A) 09/07/2015 1226   Sepsis Labs: @LABRCNTIP (procalcitonin:4,lacticidven:4)  ) Recent Results (from the past 240 hour(s))  Urine culture     Status: Abnormal   Collection Time: 09/07/15 12:26 PM  Result Value Ref Range Status   Specimen Description URINE, CATHETERIZED  Final   Special Requests NONE  Final   Culture 50,000 COLONIES/mL ESCHERICHIA COLI (A)  Final   Report Status 09/09/2015 FINAL  Final   Organism ID, Bacteria ESCHERICHIA COLI (A)  Final      Susceptibility   Escherichia coli -  MIC*    AMPICILLIN <=2 SENSITIVE Sensitive     CEFAZOLIN <=4 SENSITIVE Sensitive     CEFTRIAXONE <=1  SENSITIVE Sensitive     CIPROFLOXACIN <=0.25 SENSITIVE Sensitive     GENTAMICIN <=1 SENSITIVE Sensitive     IMIPENEM <=0.25 SENSITIVE Sensitive     NITROFURANTOIN <=16 SENSITIVE Sensitive     TRIMETH/SULFA <=20 SENSITIVE Sensitive     AMPICILLIN/SULBACTAM <=2 SENSITIVE Sensitive     PIP/TAZO <=4 SENSITIVE Sensitive     Extended ESBL NEGATIVE Sensitive     * 50,000 COLONIES/mL ESCHERICHIA COLI  Culture, blood (Routine x 2)     Status: None (Preliminary result)   Collection Time: 09/07/15  1:20 PM  Result Value Ref Range Status   Specimen Description BLOOD RIGHT FOREARM  Final   Special Requests IN PEDIATRIC BOTTLE  3CC  Final   Culture NO GROWTH 4 DAYS  Final   Report Status PENDING  Incomplete  Culture, blood (Routine x 2)     Status: None (Preliminary result)   Collection Time: 09/07/15  1:38 PM  Result Value Ref Range Status   Specimen Description BLOOD LEFT HAND  Final   Special Requests IN PEDIATRIC BOTTLE  1CC  Final   Culture NO GROWTH 4 DAYS  Final   Report Status PENDING  Incomplete  MRSA PCR Screening     Status: None   Collection Time: 09/07/15  6:12 PM  Result Value Ref Range Status   MRSA by PCR NEGATIVE NEGATIVE Final    Comment:        The GeneXpert MRSA Assay (FDA approved for NASAL specimens only), is one component of a comprehensive MRSA colonization surveillance program. It is not intended to diagnose MRSA infection nor to guide or monitor treatment for MRSA infections.       Radiology Studies: Mr Brain 72 Contrast  Result Date: 09/10/2015 CLINICAL DATA:  82 y/o F; patient presenting with decreased responsiveness and lethargy being treated for urinary tract infection. History of recent cerebrovascular accident, diabetes, chronic kidney disease, and COPD. EXAM: MRI HEAD WITHOUT CONTRAST TECHNIQUE: Multiplanar, multiecho pulse sequences of the brain and  surrounding structures were obtained without intravenous contrast. COMPARISON:  CT head 08/22/2015 and MR head 07/11/2015 FINDINGS: Brain: Small area of diffusion restriction in the left posterior and lateral frontal low consistent with acute/ early subacute infarct. There is associated mild T2 FLAIR hyperintensity. There are several additional punctate infarcts scattered throughout the left frontal and parietal lobes, within the left thalamus, left occipital lobe, right caudate head, right anterior frontal periventricular white matter, right posterior parietal lobe, right occipital lobe cortex, and within the left greater than right cerebellar hemispheres. The susceptibility weighted sequence is severely motion degraded, there is no large gross focus of susceptibility hypointensity to suggest hemorrhage. There is extensive confluent T2 FLAIR hyperintense signal abnormality in supratentorial white matter compatible with advanced chronic microvascular ischemic changes. There is moderate parenchymal volume loss. Extra-axial space: Ventriculomegaly appears disproportional to volume loss probably representing idiopathic normal pressure hydrocephalus. No midline shift. No effacement of basilar cisterns. No extra-axial collection is identified. Proximal intracranial flow voids are maintained. No abnormality of the cervical medullary junction. Other: No abnormal signal of the paranasal sinuses. No abnormal signal of the mastoid air cells. Bilateral intra-ocular lens replacement. Calvarium is unremarkable. IMPRESSION: 1. Multiple areas of acute/early subacute infarct in multiple vascular territories with the largest lesion in the left posterior frontal lobe involving the precentral gyrus. No associated hemorrhage is identified. This is probably related to embolic disease given multiple vascular territories. 2. Stable ventriculomegaly that appears disproportional to volume loss, possibly representing idiopathic normal  pressure hydrocephalus. 3. Advanced chronic microvascular ischemic changes and moderate brain parenchymal volume loss. These results will be called to the ordering clinician or representative by the Radiologist Assistant, and communication documented in the PACS or zVision Dashboard. Electronically Signed   By: Kristine Garbe M.D.   On: 09/10/2015 23:39     Scheduled Meds: . aspirin  300 mg Rectal Daily  . [START ON 09/13/2015] enoxaparin (LOVENOX) injection  30 mg Subcutaneous Q24H  . insulin aspart  0-9 Units Subcutaneous Q4H  . prednisoLONE acetate  1 drop Both Eyes QID   Continuous Infusions: . dextrose 5 % and 0.45 % NaCl with KCl 40 mEq/L 75 mL/hr at 09/10/15 2342  . [START ON 09/12/2015] feeding supplement (JEVITY 1.2 CAL)       LOS: 4 days    Time spent: 30 min    Janece Canterbury, MD [facility] Pager [phone]  If 7PM-7AM, please contact night-coverage [email or web address] Password [facility] 09/11/2015, 4:38 PM

## 2015-09-11 NOTE — Progress Notes (Signed)
Nutrition Follow-up  DOCUMENTATION CODES:   Severe malnutrition in context of chronic illness  INTERVENTION:  Once PEG is placed and stable for use, Initiate Jevity 1.2 formula at 20 ml/hr and increase by 10 ml every 8 hours to goal rate of 60 ml/hr which provides 1728 kcal, 80 grams of protein, and 1166 ml of H2O.   If unable to place PEG within 24-48 hours, recommend temporary Cortrak NGT placement to provide nutrition feedings.   Once nutrition is started, monitor magnesium, potassium, and phosphorus daily for at least 3 days, MD to replete as needed, as pt is at risk for refeeding syndrome given severe malnutrition and very little to no PO intake since admission.  RD to continue to monitor.   NUTRITION DIAGNOSIS:   Malnutrition related to chronic illness as evidenced by percent weight loss, severe depletion of body fat, severe depletion of muscle mass; ongoing  GOAL:   Patient will meet greater than or equal to 90% of their needs; not met  MONITOR:   Labs, Weight trends, TF tolerance, Skin, I & O's  REASON FOR ASSESSMENT:   Low Braden    ASSESSMENT:   82 y.o. female with recent CVA few weeks ago with residual right sided weakness, DM, CKD 3, COPD, poorly communicative at baseline who was admitted 09/07/15 from SNF due to decreased responsiveness and lethargy. She had a fever few days ago and was started on antibiotics at [facility] for a UTI. Upon initial presentation, the patient was found to have acute kidney injury with a creatinine of 2.  Per Palliative care note, family desire for feeding tube and will likely need PEG. RD consulted for enteral/tube feeding initiation and management. No feeding tube access during time of visit. IR has been consulted for feeding tube placement. If PEG is unable to be placed within 24-48 hours, recommend temporary Cortrak NGT feeding tube. Family member at bedside during time of visit. Pt more awake/alert today. Family member reports pt with poor  po intake PTA as pt would usually skip meals and/or eat very little at her meals. Noted, once nutrition is started, pt will be at risk for refeeding syndrome given severe malnutrition and very little to no PO intake. RD to continue to monitor.   Labs and medications reviewed.   Diet Order:  Diet NPO time specified  Skin:  Reviewed, no issues  Last BM:  8/23  Height:   Ht Readings from Last 1 Encounters:  09/07/15 5' 4"  (1.626 m)    Weight:   Wt Readings from Last 1 Encounters:  09/11/15 148 lb (67.1 kg)    Ideal Body Weight:  54.5 kg  BMI:  Body mass index is 25.4 kg/m.  Estimated Nutritional Needs:   Kcal:  1650-1850  Protein:  75-85 grams  Fluid:  1.6 - 1.8 L/day  EDUCATION NEEDS:   No education needs identified at this time  [name], MS, RD, LDN Pager # [phone] After hours/ weekend pager # [phone]

## 2015-09-12 ENCOUNTER — Encounter (HOSPITAL_COMMUNITY): Payer: Self-pay

## 2015-09-12 LAB — CBC
HEMATOCRIT: 38.4 % (ref 36.0–46.0)
Hemoglobin: 12.6 g/dL (ref 12.0–15.0)
MCH: 26.4 pg (ref 26.0–34.0)
MCHC: 32.8 g/dL (ref 30.0–36.0)
MCV: 80.5 fL (ref 78.0–100.0)
PLATELETS: 148 10*3/uL — AB (ref 150–400)
RBC: 4.77 MIL/uL (ref 3.87–5.11)
RDW: 16.8 % — AB (ref 11.5–15.5)
WBC: 10.9 10*3/uL — ABNORMAL HIGH (ref 4.0–10.5)

## 2015-09-12 LAB — BASIC METABOLIC PANEL
Anion gap: 9 (ref 5–15)
BUN: 14 mg/dL (ref 6–20)
CHLORIDE: 112 mmol/L — AB (ref 101–111)
CO2: 20 mmol/L — AB (ref 22–32)
CREATININE: 1.21 mg/dL — AB (ref 0.44–1.00)
Calcium: 8.7 mg/dL — ABNORMAL LOW (ref 8.9–10.3)
GFR calc Af Amer: 48 mL/min — ABNORMAL LOW (ref >60)
GFR calc non Af Amer: 41 mL/min — ABNORMAL LOW (ref >60)
Glucose, Bld: 145 mg/dL — ABNORMAL HIGH (ref 65–99)
Potassium: 4.5 mmol/L (ref 3.5–5.1)
Sodium: 141 mmol/L (ref 135–145)

## 2015-09-12 LAB — CULTURE, BLOOD (ROUTINE X 2)
Culture: NO GROWTH
Culture: NO GROWTH

## 2015-09-12 LAB — LIPID PANEL
CHOL/HDL RATIO: 5.5 ratio
Cholesterol: 120 mg/dL (ref 0–200)
HDL: 22 mg/dL — ABNORMAL LOW (ref >40)
LDL CALC: 71 mg/dL (ref 0–99)
Triglycerides: 135 mg/dL (ref <150)
VLDL: 27 mg/dL (ref 0–40)

## 2015-09-12 LAB — GLUCOSE, CAPILLARY
GLUCOSE-CAPILLARY: 148 mg/dL — AB (ref 65–99)
GLUCOSE-CAPILLARY: 155 mg/dL — AB (ref 65–99)
GLUCOSE-CAPILLARY: 161 mg/dL — AB (ref 65–99)
Glucose-Capillary: 143 mg/dL — ABNORMAL HIGH (ref 65–99)
Glucose-Capillary: 144 mg/dL — ABNORMAL HIGH (ref 65–99)
Glucose-Capillary: 149 mg/dL — ABNORMAL HIGH (ref 65–99)
Glucose-Capillary: 146 mg/dL — ABNORMAL HIGH (ref 65–99)

## 2015-09-12 MED ORDER — KCL IN DEXTROSE-NACL 20-5-0.45 MEQ/L-%-% IV SOLN
INTRAVENOUS | Status: DC
  Administered 2015-09-12 – 2015-09-13 (×3): via INTRAVENOUS
  Administered 2015-09-15: 75 mL/h via INTRAVENOUS
  Filled 2015-09-12 (×5): qty 1000

## 2015-09-12 NOTE — Progress Notes (Signed)
Physical Therapy Treatment Patient Details Name: Paula West MRN: [id number] DOB: October 27, 1932 Today's Date: 09/12/2015    History of Present Illness Pt admitted from SNF with lethargy and oral intake. MRI revealed B strokes, largest in parietal region. PMH: recent thalamic CVA with residual R side weakness, HTN, DM, HLD, CKD. Palliative medicine involved in pt's care.    PT Comments    Pt remains to require +2 total assist with zero sitting balance.  PTA performed limited exercise and 10 min of supported static sitting with cues for posturing.  Pt is a lift to the chair until strength improves.    Follow Up Recommendations  SNF;Supervision/Assistance - 24 hour     Equipment Recommendations   (TBD at next venue.  )    Recommendations for Other Services       Precautions / Restrictions Precautions Precautions: Fall Precaution Comments: dense R hemiplegia Restrictions Weight Bearing Restrictions: No    Mobility  Bed Mobility Overal bed mobility: Needs Assistance   Rolling: +2 for physical assistance;Total assist (rolling to R and L to place maxi-move sling.  )         General bed mobility comments: Pt able to reach for railing with L arm.    Transfers Overall transfer level: Needs assistance               General transfer comment: not safe to attempt at this time. Performed maxi-move transfer for patient safety.  In seated position in recliner chair .  PTA assisted patient in static sitting.  Pt required assist for weight shifting R and cervical extension.  When finished pt propped in chair to maintain nuetral alignment.    Ambulation/Gait                 Stairs            Wheelchair Mobility    Modified Rankin (Stroke Patients Only)       Balance                                    Cognition Arousal/Alertness: Awake/alert Behavior During Therapy: WFL for tasks assessed/performed Overall Cognitive Status: Difficult to  assess Area of Impairment: Following commands       Following Commands: Follows one step commands inconsistently       General Comments: pt is non verbal, smiles only    Exercises General Exercises - Lower Extremity Ankle Circles/Pumps: Left;10 reps;Supine;AAROM Heel Slides: AAROM;Left;10 reps;Supine Hip ABduction/ADduction: AAROM;Left;10 reps;Supine Straight Leg Raises: AAROM;PROM;Left;10 reps;Supine    General Comments        Pertinent Vitals/Pain Pain Assessment: Faces Faces Pain Scale: Hurts little more Pain Location: R arm when positioning Pain Descriptors / Indicators: Guarding;Grimacing Pain Intervention(s): Repositioned;Monitored during session    Home Living                      Prior Function            PT Goals (current goals can now be found in the care plan section) Acute Rehab PT Goals Patient Stated Goal: not expressed Potential to Achieve Goals: Poor Progress towards PT goals: Progressing toward goals    Frequency  Min 3X/week    PT Plan Current plan remains appropriate    Co-evaluation             End of Session   Activity Tolerance: Patient limited by fatigue  Patient left: in bed;with call bell/phone within reach     Time: 1354-1424 PT Time Calculation (min) (ACUTE ONLY): 30 min  Charges:  $Therapeutic Exercise: 8-22 mins $Neuromuscular Re-education: 8-22 mins                    G Codes:      Cristela Blue 09-13-2015, 3:23 PM  Governor Rooks, PTA pager [phone]

## 2015-09-12 NOTE — Progress Notes (Signed)
Daily Progress Note   Patient Name: Paula West       Date: 09/12/2015 DOB: 07/03/1932  Age: 82 y.o. MRN#: [id number] Attending Physician: Janece Canterbury, MD Primary Care Physician: No PCP Per Patient Admit Date: 09/07/2015  Reason for Consultation/Follow-up: Establishing goals of care  Subjective: Ms. Thrush is very lethargic. Cannot get her to awaken. Slumps to left side often, drool from mouth. I did speak with her son, Collier Bullock. Explained situation that she has had another stroke. Discussed feeding tube and what goals are for her care at this point with her poor health. He understands that feeding tube may prolong her life - he says he isn't sure that is what he would want for her. Understands artificial feeding will not improve her QOL or reverse the damage from strokes. He seems understanding that QOL is poor and says that he has accepted and trying to help his family accept that she may not live long. Clearly educated that without feeding will need focus on comfort with hospice care and that prognosis could be a couple weeks or less. He verbalizes understanding and says he will talk more with his sister tonight and will know decisions tomorrow.   Length of Stay: 5  Current Medications: Scheduled Meds:  . aspirin  300 mg Rectal Daily  . [START ON 09/13/2015] enoxaparin (LOVENOX) injection  30 mg Subcutaneous Q24H  . insulin aspart  0-9 Units Subcutaneous Q4H  . prednisoLONE acetate  1 drop Both Eyes QID    Continuous Infusions: . dextrose 5 % and 0.45 % NaCl with KCl 40 mEq/L 75 mL/hr at 09/12/15 0056  . feeding supplement (JEVITY 1.2 CAL)      PRN Meds: acetaminophen **OR** acetaminophen, albuterol  Physical Exam  Constitutional: She appears well-developed. She appears  lethargic.  HENT:  Head: Normocephalic and atraumatic.  Cardiovascular: Normal rate.   Pulmonary/Chest: Effort normal. No accessory muscle usage. No tachypnea. No respiratory distress.  Clear moderate oral secretions  Abdominal: Soft. Normal appearance.  Neurological: She appears lethargic.            Vital Signs: BP (!) 154/78 (BP Location: Left Arm)   Pulse (!) 57   Temp 97.9 F (36.6 C) (Oral)   Resp 18   Ht 5\' 4"  (1.626 m)   Wt 67.3 kg (148 lb  6.4 oz)   SpO2 97%   BMI 25.47 kg/m  SpO2: SpO2: 97 % O2 Device: O2 Device: Not Delivered O2 Flow Rate:    Intake/output summary:  Intake/Output Summary (Last 24 hours) at 09/12/15 1306 Last data filed at 09/12/15 0900  Gross per 24 hour  Intake              900 ml  Output                0 ml  Net              900 ml   LBM: Last BM Date: 09/10/15 Baseline Weight: Weight: 74.8 kg (165 lb) Most recent weight: Weight: 67.3 kg (148 lb 6.4 oz)       Palliative Assessment/Data: PPS: 10%   Flowsheet Rows   Flowsheet Row Most Recent Value  Intake Tab  Referral Department  Hospitalist  Unit at Time of Referral  Orthopedic Unit  Palliative Care Primary Diagnosis  Neurology  Date Notified  09/08/15  Palliative Care Type  New Palliative care  Reason for referral  Clarify Goals of Care  Date of Admission  09/07/15  Date first seen by Palliative Care  09/09/15  # of days Palliative referral response time  1 Day(s)  # of days IP prior to Palliative referral  1  Clinical Assessment  Psychosocial & Spiritual Assessment  Palliative Care Outcomes      Patient Active Problem List   Diagnosis Date Noted  . Embolic stroke involving cerebral artery ([place]) 09/11/2015  . Protein-calorie malnutrition, severe 09/10/2015  . Palliative care encounter   . DNR (do not resuscitate)   . Poor appetite   . Dysphagia   . Dehydration 09/07/2015  . UTI (lower urinary tract infection) 09/07/2015  . Metabolic encephalopathy [id number]  .  Recent NSTEMI (non-ST elevated myocardial infarction-July 2017) ([place]) 09/07/2015  . Right Adrenal nodule ([place]) 09/07/2015  . Altered mental status   . AKI (acute kidney injury) ([place]) 08/22/2015  . Delirium 08/22/2015  . Esophageal reflux   . Abdominal aortic atherosclerosis ([place]) 08/02/2015  . Chronic obstructive pulmonary disease ([place])   . 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 CKD (chronic kidney disease), stage III 07/12/2015  . Hypercalcemia 07/12/2015  . Asthma   . COPD (chronic obstructive pulmonary disease) ([place])   . Hypertensive heart disease without CHF   . Hyperlipidemia   . Diabetes mellitus, controlled ([place])   . Essential hypertension     Palliative Care Assessment & Plan   Patient Profile: 82 y.o. female  with past medical history of recent thalamic CVA June 2017 with residual right sided weakness, DM, HLD, CKD 3, COPD, poorly communicative at baseline admitted 09/07/2015 with decreased responsiveness and lethargy. Recently d/c from hospital 08/25/15 with dehydration and acute renal failure but responded to fluids. Appetite and hydration continue to be problematic. Incidental finding of 3 cm nodule right adrenal gland with recommendations for MRI f/u once renal function improves - does not appear this has been done. MRI shows recurrent stroke.   Assessment: Lying in bed, less responsiveness today.   Recommendations/Plan:  No signs of pain or discomfort. Keep clean, dry. Turn q2h. High risk for decubitus ulcers.   Will likely need PEG for nutrition/hydration per family wishes.   Await family decision for PEG placement vs hospice care. I believe she would be eligible for hospice facility without feeding.   Code Status:    Code Status Orders  Start     Ordered   09/10/15 1539  Do not attempt resuscitation (DNR)  Continuous    Question Answer Comment  In the event of cardiac or respiratory ARREST Do not call a "code blue"     In the event of cardiac or respiratory ARREST Do not perform Intubation, CPR, defibrillation or ACLS   In the event of cardiac or respiratory ARREST Use medication by any route, position, wound care, and other measures to relive pain and suffering. May use oxygen, suction and manual treatment of airway obstruction as needed for comfort.      09/10/15 1538    Code Status History    Date Active Date Inactive Code Status Order ID Comments User Context   09/07/2015  3:09 PM 09/10/2015  3:38 PM Full Code [id number]  Samella Parr, NP Inpatient   08/22/2015  9:38 PM 08/25/2015  7:15 PM DNR [id number]  [name], MD Inpatient   08/02/2015  2:19 AM 08/05/2015  7:35 PM DNR [id number]  Toy Baker, MD Inpatient   07/11/2015  4:02 PM 07/13/2015 10:10 PM Full Code [id number]  Radene Gunning, NP ED       Prognosis:  To be determined - based on decisions. < 2 weeks without artificial feeding.   Discharge Planning:  To Be Determined   Thank you for allowing the Palliative Medicine Team to assist in the care of this patient.   Time In: 1300 Time Out: 1320 Total Time 32min Prolonged Time Billed  no       Greater than 50%  of this time was spent counseling and coordinating care related to the above assessment and plan.  Pershing Proud, NP  Please contact Palliative Medicine Team phone at [phone] for questions and concerns.

## 2015-09-12 NOTE — Progress Notes (Addendum)
PROGRESS NOTE  Paula West  QTM:[id number] DOB: [birth year] DOA: 09/07/2015 PCP: No PCP Per Patient  Brief Narrative:   Paula West is an 82 y.o. female who suffered an acute left thalamic infarct in late June.  She was hospitalized a few weeks later for chest pain and elevated troponin at which time plavix was added.  She was rehospitalized in early August for changes in speech and increased lethargy at which time she was found to be dehydrated with AKI with a creatinine of 4.  She was rehydrated and discharged to home.  She met with Dr. Jaynee Eagles, Neurology on 8/15, who noted that she had not had any improvement in her stroke symptoms and in fact had increasing confusion, decreased oral intake, and he was concerned that she may need a feeding tube.  She was admitted 09/07/15 from SNF due to decreased responsiveness and lethargy.  She had recently been started on antibiotics at SNF for a UTI. Upon initial presentation, the patient was found to have acute kidney injury with a creatinine of 2.  Since admission she has not had anything to eat or drink. She met with speech therapy who recommended a dysphagia 1 diet with thin liquids, however she refuses to eat. Palliative care was consulted and met with the family today. After prolonged discussion, she was transitioned to DO NOT RESUSCITATE status.  After reviewing records on 8/23, notes suggested that she had been speaking and function  Assessment & Plan:   Principal Problem:   AKI (acute kidney injury) ([place]) Active Problems:   COPD (chronic obstructive pulmonary disease) ([place])   Hyperlipidemia   Diabetes mellitus, controlled ([place])   Essential hypertension   Thalamic infarction (June 2017 w/ residual dysarthria/FTT)   CKD (chronic kidney disease), stage III   Dehydration   UTI (lower urinary tract infection)   Metabolic encephalopathy   Recent NSTEMI (non-ST elevated myocardial infarction-July 2017) ([facility])   Right Adrenal nodule ([facility])  Altered mental status   Protein-calorie malnutrition, severe   Palliative care encounter   DNR (do not resuscitate)   Poor appetite   Dysphagia   Embolic stroke involving cerebral artery ([facility])   Lethargy, metabolic encephalopathy due to UTI, dehydration, and acute/subacute strokes superimposed on recent thalamic infarct in June.  She is unable to communicate or eat safely.  Right hemiparesis.  Also MRI from 8/23 were discussed with the daughter [name] today by phone. -  MRI brain:  Multiple areas of acute and early subacute infarct in multiple vascular territories with the largest lesion in the left posterior frontal lobe involving the precentral gyrus. Findings suggestive of embolic disease. -  Continue rectal aspirin -  PT/OT recommending skilled nursing -  Ongoing speech evaluations -  Neurology consultation appreciated -  MRA, echocardiogram, carotid duplex were completed 2 months ago -  Telemetry: No evidence of atrial fibrillation  Dysphagia with severe protein calorie malnutrition -  Holding off on gastrostomy tube placement pending further discussions with the family about goals of care -  Family meeting on evening of 8/25   AKI (acute kidney injury) secondary to dehydration, in the setting of stage III CKD, resolved. -  Continue to hold diuretic and ARB -  Continue IV fluids  Hypokalemia, resolved.  decreased potassium to IVF  UTI (lower urinary tract infection) -  Discontinue ciprofloxacin as this can worsen encephalopathy and do not want to confound   COPD (chronic obstructive pulmonary disease), stable, continue bronchodilators as needed.  Diabetes mellitus, controlled  -  Hold Tradjenta  - continue SSI - A1c was 8.4 on 7/15  Recent NSTEMI (non-ST elevated myocardial infarction-July 2017)  resume statin, beta blocker, and Plavix once feeding tube placed  Essential hypertension Current blood pressures are elevated.  Will resume blood pressure  medications if PEG tube placed.  Right Adrenal nodule, 3cm with indeterminate Hounsfield unit measurements and poorly defined margins -  Outpatient MRI  Enlarged left ovary -  Outpatient pelvic US  DVT prophylaxis:  lovenox Code Status:  DNR Family Communication:  Spoke at length with patient's daughter [name] by phone on 8/24. Family meeting scheduled for 8/25.   Disposition Plan:  To SNF either with gastrostomy tube with goals of care to include physical and occupational therapy versus to skilled nursing facility with palliative care and comfort feeding  Consultants:   Palliative care  Neurology  Procedures:  none  Antimicrobials:   Ciprofloxacin 8/21 - 8.24   Subjective: Unable to communicate  Objective: Vitals:   09/12/15 0014 09/12/15 0408 09/12/15 1000 09/12/15 1500  BP: 129/70 137/65 (!) 154/78 (!) 150/74  Pulse: 79 (!) 52 (!) 57 (!) 58  Resp: 18 16 18 18   Temp: 98.4 F (36.9 C) 97.8 F (36.6 C) 97.9 F (36.6 C) 97.6 F (36.4 C)  TempSrc: Oral Oral Oral Oral  SpO2: 98% 97% 97% 98%  Weight:  67.3 kg (148 lb 6.4 oz)    Height:        Intake/Output Summary (Last 24 hours) at 09/12/15 1634 Last data filed at 09/12/15 1500  Gross per 24 hour  Intake             1500 ml  Output                0 ml  Net             1500 ml   Filed Weights   09/09/15 0700 09/11/15 0430 09/12/15 0408  Weight: 71.3 kg (157 lb 1.6 oz) 67.1 kg (148 lb) 67.3 kg (148 lb 6.4 oz)    Examination:  General exam:  Adult female, slumped to the right in bed.  Right facial droop.  No acute distress. Able to follow a few simple commands.  Smiled appropriately at me repeatedly and held my hand for a while.   HEENT:  NCAT, MMM Respiratory system: Clear to auscultation bilaterally Cardiovascular system: Regular rate and rhythm, normal S1/S2. No murmurs, rubs, gallops or clicks.  Warm extremities Gastrointestinal system: Normal active bowel sounds, soft, nondistended, nontender. MSK:   Decreased tone and bulk, no lower extremity edema Neuro:  Mild right facial droop.  Difficulty looking to the right.  Ignores right hand and right leg.  Flaccid right arm.  2 out of 5 strength of the right lower extremity.  Moves left arm and left leg spontaneously.       Data Reviewed: I have personally reviewed following labs and imaging studies  CBC:  Recent Labs Lab 09/07/15 1320 09/08/15 0540 09/10/15 0517 09/11/15 0811 09/12/15 0635  WBC 8.8 9.0 12.2* 10.3 10.9*  NEUTROABS 6.0  --   --   --   --   HGB 13.5 14.1 12.5 14.8 12.6  HCT 41.4 43.3 38.5 44.6 38.4  MCV 80.7 80.0 79.4 78.9 80.5  PLT 130* 121* 170 126* 585*   Basic Metabolic Panel:  Recent Labs Lab 09/07/15 1918 09/08/15 0540 09/09/15 0442 09/10/15 0517 09/11/15 0811 09/12/15 0635  NA  --  142 142 140 141 141  K  --  3.7 3.3* 3.1* 3.6 4.5  CL  --  113* 114* 111 109 112*  CO2  --  17* 18* 18* 19* 20*  GLUCOSE  --  109* 113* 129* 142* 145*  BUN  --  45* 26* 20 19 14   CREATININE  --  1.61* 1.30* 1.28* 1.40* 1.21*  CALCIUM  --  9.0 8.6* 8.9 8.9 8.7*  MG 2.0  --   --   --   --   --   PHOS 2.2*  --   --   --   --   --    GFR: Estimated Creatinine Clearance: 34.9 mL/min (by C-G formula based on SCr of 1.21 mg/dL). Liver Function Tests:  Recent Labs Lab 09/07/15 1320  AST 22  ALT 25  ALKPHOS 113  BILITOT 0.7  PROT 6.6  ALBUMIN 2.9*   No results for input(s): LIPASE, AMYLASE in the last 168 hours.  Recent Labs Lab 09/10/15 0409  AMMONIA 25   Coagulation Profile:  Recent Labs Lab 09/11/15 1116  INR 1.24   Cardiac Enzymes: No results for input(s): CKTOTAL, CKMB, CKMBINDEX, TROPONINI in the last 168 hours. BNP (last 3 results) No results for input(s): PROBNP in the last 8760 hours. HbA1C: No results for input(s): HGBA1C in the last 72 hours. CBG:  Recent Labs Lab 09/12/15 0358 09/12/15 0847 09/12/15 0900 09/12/15 1141 09/12/15 1627  GLUCAP 161* 143* 144* 155* 149*   Lipid  Profile:  Recent Labs  09/12/15 0635  CHOL 120  HDL 22*  LDLCALC 71  TRIG 135  CHOLHDL 5.5   Thyroid Function Tests: No results for input(s): TSH, T4TOTAL, FREET4, T3FREE, THYROIDAB in the last 72 hours. Anemia Panel: No results for input(s): VITAMINB12, FOLATE, FERRITIN, TIBC, IRON, RETICCTPCT in the last 72 hours. Urine analysis:    Component Value Date/Time   COLORURINE YELLOW 09/07/2015 1226   APPEARANCEUR TURBID (A) 09/07/2015 1226   LABSPEC 1.020 09/07/2015 1226   PHURINE 5.0 09/07/2015 1226   GLUCOSEU 100 (A) 09/07/2015 1226   HGBUR LARGE (A) 09/07/2015 1226   BILIRUBINUR NEGATIVE 09/07/2015 1226   KETONESUR NEGATIVE 09/07/2015 1226   PROTEINUR 30 (A) 09/07/2015 1226   UROBILINOGEN 0.2 06/22/2010 1226   NITRITE NEGATIVE 09/07/2015 1226   LEUKOCYTESUR LARGE (A) 09/07/2015 1226   Sepsis Labs: @LABRCNTIP (procalcitonin:4,lacticidven:4)  ) Recent Results (from the past 240 hour(s))  Urine culture     Status: Abnormal   Collection Time: 09/07/15 12:26 PM  Result Value Ref Range Status   Specimen Description URINE, CATHETERIZED  Final   Special Requests NONE  Final   Culture 50,000 COLONIES/mL ESCHERICHIA COLI (A)  Final   Report Status 09/09/2015 FINAL  Final   Organism ID, Bacteria ESCHERICHIA COLI (A)  Final      Susceptibility   Escherichia coli - MIC*    AMPICILLIN <=2 SENSITIVE Sensitive     CEFAZOLIN <=4 SENSITIVE Sensitive     CEFTRIAXONE <=1 SENSITIVE Sensitive     CIPROFLOXACIN <=0.25 SENSITIVE Sensitive     GENTAMICIN <=1 SENSITIVE Sensitive     IMIPENEM <=0.25 SENSITIVE Sensitive     NITROFURANTOIN <=16 SENSITIVE Sensitive     TRIMETH/SULFA <=20 SENSITIVE Sensitive     AMPICILLIN/SULBACTAM <=2 SENSITIVE Sensitive     PIP/TAZO <=4 SENSITIVE Sensitive     Extended ESBL NEGATIVE Sensitive     * 50,000 COLONIES/mL ESCHERICHIA COLI  Culture, blood (Routine x 2)     Status: None (Preliminary result)   Collection Time: 09/07/15  1:20 PM  Result Value  Ref Range Status   Specimen Description BLOOD RIGHT FOREARM  Final   Special Requests IN PEDIATRIC BOTTLE  3CC  Final   Culture NO GROWTH 4 DAYS  Final   Report Status PENDING  Incomplete  Culture, blood (Routine x 2)     Status: None (Preliminary result)   Collection Time: 09/07/15  1:38 PM  Result Value Ref Range Status   Specimen Description BLOOD LEFT HAND  Final   Special Requests IN PEDIATRIC BOTTLE  1CC  Final   Culture NO GROWTH 4 DAYS  Final   Report Status PENDING  Incomplete  MRSA PCR Screening     Status: None   Collection Time: 09/07/15  6:12 PM  Result Value Ref Range Status   MRSA by PCR NEGATIVE NEGATIVE Final    Comment:        The GeneXpert MRSA Assay (FDA approved for NASAL specimens only), is one component of a comprehensive MRSA colonization surveillance program. It is not intended to diagnose MRSA infection nor to guide or monitor treatment for MRSA infections.       Radiology Studies: Mr Brain 19 Contrast  Result Date: 09/10/2015 CLINICAL DATA:  82 y/o F; patient presenting with decreased responsiveness and lethargy being treated for urinary tract infection. History of recent cerebrovascular accident, diabetes, chronic kidney disease, and COPD. EXAM: MRI HEAD WITHOUT CONTRAST TECHNIQUE: Multiplanar, multiecho pulse sequences of the brain and surrounding structures were obtained without intravenous contrast. COMPARISON:  CT head 08/22/2015 and MR head 07/11/2015 FINDINGS: Brain: Small area of diffusion restriction in the left posterior and lateral frontal low consistent with acute/ early subacute infarct. There is associated mild T2 FLAIR hyperintensity. There are several additional punctate infarcts scattered throughout the left frontal and parietal lobes, within the left thalamus, left occipital lobe, right caudate head, right anterior frontal periventricular white matter, right posterior parietal lobe, right occipital lobe cortex, and within the left greater  than right cerebellar hemispheres. The susceptibility weighted sequence is severely motion degraded, there is no large gross focus of susceptibility hypointensity to suggest hemorrhage. There is extensive confluent T2 FLAIR hyperintense signal abnormality in supratentorial white matter compatible with advanced chronic microvascular ischemic changes. There is moderate parenchymal volume loss. Extra-axial space: Ventriculomegaly appears disproportional to volume loss probably representing idiopathic normal pressure hydrocephalus. No midline shift. No effacement of basilar cisterns. No extra-axial collection is identified. Proximal intracranial flow voids are maintained. No abnormality of the cervical medullary junction. Other: No abnormal signal of the paranasal sinuses. No abnormal signal of the mastoid air cells. Bilateral intra-ocular lens replacement. Calvarium is unremarkable. IMPRESSION: 1. Multiple areas of acute/early subacute infarct in multiple vascular territories with the largest lesion in the left posterior frontal lobe involving the precentral gyrus. No associated hemorrhage is identified. This is probably related to embolic disease given multiple vascular territories. 2. Stable ventriculomegaly that appears disproportional to volume loss, possibly representing idiopathic normal pressure hydrocephalus. 3. Advanced chronic microvascular ischemic changes and moderate brain parenchymal volume loss. These results will be called to the ordering clinician or representative by the Radiologist Assistant, and communication documented in the PACS or zVision Dashboard. Electronically Signed   By: Kristine Garbe M.D.   On: 09/10/2015 23:39     Scheduled Meds: . aspirin  300 mg Rectal Daily  . [START ON 09/13/2015] enoxaparin (LOVENOX) injection  30 mg Subcutaneous Q24H  . insulin aspart  0-9 Units Subcutaneous Q4H  . prednisoLONE acetate  1 drop Both Eyes QID  Continuous Infusions: . dextrose 5  % and 0.45 % NaCl with KCl 40 mEq/L 75 mL/hr at 09/12/15 1213  . feeding supplement (JEVITY 1.2 CAL)       LOS: 5 days    Time spent: 30 min    Janece Canterbury, MD [facility] Pager [phone]  If 7PM-7AM, please contact night-coverage [email or web address] Password [facility] 09/12/2015, 4:34 PM

## 2015-09-13 LAB — BASIC METABOLIC PANEL
Anion gap: 8 (ref 5–15)
BUN: 11 mg/dL (ref 6–20)
CHLORIDE: 101 mmol/L (ref 101–111)
CO2: 23 mmol/L (ref 22–32)
Calcium: 8.2 mg/dL — ABNORMAL LOW (ref 8.9–10.3)
Creatinine, Ser: 1.16 mg/dL — ABNORMAL HIGH (ref 0.44–1.00)
GFR calc Af Amer: 50 mL/min — ABNORMAL LOW (ref >60)
GFR, EST NON AFRICAN AMERICAN: 43 mL/min — AB (ref >60)
Glucose, Bld: 181 mg/dL — ABNORMAL HIGH (ref 65–99)
POTASSIUM: 4.7 mmol/L (ref 3.5–5.1)
SODIUM: 132 mmol/L — AB (ref 135–145)

## 2015-09-13 LAB — CBC
HEMATOCRIT: 37.7 % (ref 36.0–46.0)
Hemoglobin: 12.2 g/dL (ref 12.0–15.0)
MCH: 25.7 pg — ABNORMAL LOW (ref 26.0–34.0)
MCHC: 32.4 g/dL (ref 30.0–36.0)
MCV: 79.5 fL (ref 78.0–100.0)
Platelets: 179 10*3/uL (ref 150–400)
RBC: 4.74 MIL/uL (ref 3.87–5.11)
RDW: 16.3 % — AB (ref 11.5–15.5)
WBC: 10.6 10*3/uL — AB (ref 4.0–10.5)

## 2015-09-13 LAB — GLUCOSE, CAPILLARY
GLUCOSE-CAPILLARY: 142 mg/dL — AB (ref 65–99)
GLUCOSE-CAPILLARY: 170 mg/dL — AB (ref 65–99)
GLUCOSE-CAPILLARY: 180 mg/dL — AB (ref 65–99)
GLUCOSE-CAPILLARY: 189 mg/dL — AB (ref 65–99)
Glucose-Capillary: 172 mg/dL — ABNORMAL HIGH (ref 65–99)
Glucose-Capillary: 176 mg/dL — ABNORMAL HIGH (ref 65–99)

## 2015-09-13 MED ORDER — LORAZEPAM 2 MG/ML PO CONC: 0.5000 mg | ORAL | Status: DC | PRN

## 2015-09-13 MED ORDER — LORAZEPAM 0.5 MG PO TABS: 0.5000 mg | ORAL_TABLET | ORAL | Status: DC | PRN

## 2015-09-13 NOTE — Progress Notes (Signed)
PROGRESS NOTE  Stepahnie West  ZTI:[id number] DOB: 1932/08/31 DOA: 09/07/2015 PCP: No PCP Per Patient  Brief Narrative:   Paula West is an 82 y.o. female who suffered an acute left thalamic infarct in late June.  She was hospitalized a few weeks later for chest pain and elevated troponin at which time plavix was added.  She was rehospitalized in early August for changes in speech and increased lethargy at which time she was found to be dehydrated with AKI with a creatinine of 4.  She was rehydrated and discharged to home.  She met with Dr. Jaynee Eagles, Neurology on 8/15, who noted that she had not had any improvement in her stroke symptoms and in fact had increasing confusion, decreased oral intake, and he was concerned that she may need a feeding tube.  She was admitted 09/07/15 from SNF due to decreased responsiveness and lethargy.  She had recently been started on antibiotics at SNF for a UTI. Upon initial presentation, the patient was found to have acute kidney injury with a creatinine of 2.  Since admission she has not had anything to eat or drink. She met with speech therapy who recommended a dysphagia 1 diet with thin liquids, however she refuses to eat. Palliative care was consulted and met with the family today. After prolonged discussion, she was transitioned to DO NOT RESUSCITATE status.  After reviewing records on 8/23, notes suggested that she had been speaking and function.  Family has decided to pursue hospice care.    Assessment & Plan:   Principal Problem:   AKI (acute kidney injury) ([place]) Active Problems:   COPD (chronic obstructive pulmonary disease) ([place])   Hyperlipidemia   Diabetes mellitus, controlled ([place])   Essential hypertension   Thalamic infarction (June 2017 w/ residual dysarthria/FTT)   CKD (chronic kidney disease), stage III   Dehydration   UTI (lower urinary tract infection)   Metabolic encephalopathy   Recent NSTEMI (non-ST elevated myocardial infarction-July  2017) (HCC)   Right Adrenal nodule (HCC)   Altered mental status   Protein-calorie malnutrition, severe   Palliative care encounter   DNR (do not resuscitate)   Poor appetite   Dysphagia   Embolic stroke involving cerebral artery (HCC)   Lethargy, metabolic encephalopathy due to UTI, dehydration, and acute/subacute strokes superimposed on recent thalamic infarct in June.  She is unable to communicate or eat safely.  Right hemiparesis.   -  MRI brain:  Multiple areas of acute and early subacute infarct in multiple vascular territories with the largest lesion in the left posterior frontal lobe involving the precentral gyrus. Findings suggestive of embolic disease. -  D/c rectal aspirin -  Neurology consultation appreciated -  D/c telemetry  Dysphagia with severe protein calorie malnutrition -  Start dysphagia 1 with thin liquids for comfort  AKI (acute kidney injury) secondary to dehydration, in the setting of stage III CKD, resolved. -  Continue IV fluids until discharge  Hypokalemia, resolved.  decreased potassium to IVF  UTI (lower urinary tract infection), resolved.  COPD (chronic obstructive pulmonary disease), stable, continue bronchodilators as needed.  Diabetes mellitus, controlled  -  Hold Tradjenta  - continue SSI - A1c was 8.4 on 7/15  Recent NSTEMI (non-ST elevated myocardial infarction-July 2017), comfort measures  Essential hypertension Current blood pressures are elevated.  Comfort measures  Right Adrenal nodule, 3cm with indeterminate Hounsfield unit measurements and poorly defined margins -  No further work up.    Enlarged left ovary -  No further  work up  DVT prophylaxis:  lovenox Code Status:  DNR Family Communication:  Spoke to patient's son, Collier Bullock.    Disposition Plan:  To residential hospice when bed available.  SW consult placed.    Consultants:   Palliative care  Neurology  Procedures:  none  Antimicrobials:     Ciprofloxacin 8/21 - 8.24   Subjective: Unable to communicate  Objective: Vitals:   09/13/15 0300 09/13/15 0500 09/13/15 1127 09/13/15 1406  BP: (!) 160/84  (!) 171/78 (!) 171/86  Pulse: 93  61 62  Resp: _0 Temp: 97.6 F (36.4 C)  97.5 F (36.4 C) 97.6 F (36.4 C)  TempSrc: Axillary  Axillary Axillary  SpO2: 100%  100% 100%  Weight:  63.1 kg (139 lb 3.2 oz)    Height:        Intake/Output Summary (Last 24 hours) at 09/13/15 1616 Last data filed at 09/13/15 0300  Gross per 24 hour  Intake              385 ml  Output                0 ml  Net              385 ml   Filed Weights   09/11/15 0430 09/12/15 0408 09/13/15 0500  Weight: 67.1 kg (148 lb) 67.3 kg (148 lb 6.4 oz) 63.1 kg (139 lb 3.2 oz)    Examination:  General exam:  Adult female, slumped to the right in bed.  Right facial droop.  No acute distress. Able to follow a few simple commands.  Smiled appropriately at me repeatedly and held my hand for a while.   HEENT:  NCAT, MMM Respiratory system: Clear to auscultation bilaterally Cardiovascular system: Regular rate and rhythm, normal S1/S2. No murmurs, rubs, gallops or clicks.  Warm extremities Gastrointestinal system: Normal active bowel sounds, soft, nondistended, nontender. MSK:  Decreased tone and bulk, no lower extremity edema Neuro:  Mild right facial droop.  Difficulty looking to the right.  Ignores right hand and right leg.  Flaccid right arm.  2 out of 5 strength of the right lower extremity.  Moves left arm and left leg spontaneously.       Data Reviewed: I have personally reviewed following labs and imaging studies  CBC:  Recent Labs Lab 09/07/15 1320 09/08/15 0540 09/10/15 0517 09/11/15 0811 09/12/15 0635 09/13/15 0613  WBC 8.8 9.0 12.2* 10.3 10.9* 10.6*  NEUTROABS 6.0  --   --   --   --   --   HGB 13.5 14.1 12.5 14.8 12.6 12.2  HCT 41.4 43.3 38.5 44.6 38.4 37.7  MCV 80.7 80.0 79.4 78.9 80.5 79.5  PLT 130* 121* 170 126* 148* 811    Basic Metabolic Panel:  Recent Labs Lab 09/07/15 1918  09/09/15 0442 09/10/15 0517 09/11/15 0811 09/12/15 0635 09/13/15 0613  NA  --   < > 142 140 141 141 132*  K  --   < > 3.3* 3.1* 3.6 4.5 4.7  CL  --   < > 114* 111 109 112* 101  CO2  --   < > 18* 18* 19* 20* 23  GLUCOSE  --   < > 113* 129* 142* 145* 181*  BUN  --   < > 26* _1 CREATININE  --   < > 1.30* 1.28* 1.40* 1.21* 1.16*  CALCIUM  --   < > 8.6* 8.9  8.9 8.7* 8.2*  MG 2.0  --   --   --   --   --   --   PHOS 2.2*  --   --   --   --   --   --   < > = values in this interval not displayed. GFR: Estimated Creatinine Clearance: 33.4 mL/min (by C-G formula based on SCr of 1.16 mg/dL). Liver Function Tests:  Recent Labs Lab 09/07/15 1320  AST 22  ALT 25  ALKPHOS 113  BILITOT 0.7  PROT 6.6  ALBUMIN 2.9*   No results for input(s): LIPASE, AMYLASE in the last 168 hours.  Recent Labs Lab 09/10/15 0409  AMMONIA 25   Coagulation Profile:  Recent Labs Lab 09/11/15 1116  INR 1.24   Cardiac Enzymes: No results for input(s): CKTOTAL, CKMB, CKMBINDEX, TROPONINI in the last 168 hours. BNP (last 3 results) No results for input(s): PROBNP in the last 8760 hours. HbA1C: No results for input(s): HGBA1C in the last 72 hours. CBG:  Recent Labs Lab 09/12/15 2016 09/13/15 0003 09/13/15 0506 09/13/15 0813 09/13/15 1120  GLUCAP 146* 170* 172* 176* 189*   Lipid Profile:  Recent Labs  09/12/15 0635  CHOL 120  HDL 22*  LDLCALC 71  TRIG 135  CHOLHDL 5.5   Thyroid Function Tests: No results for input(s): TSH, T4TOTAL, FREET4, T3FREE, THYROIDAB in the last 72 hours. Anemia Panel: No results for input(s): VITAMINB12, FOLATE, FERRITIN, TIBC, IRON, RETICCTPCT in the last 72 hours. Urine analysis:    Component Value Date/Time   COLORURINE YELLOW 09/07/2015 1226   APPEARANCEUR TURBID (A) 09/07/2015 1226   LABSPEC 1.020 09/07/2015 1226   PHURINE 5.0 09/07/2015 1226   GLUCOSEU 100 (A) 09/07/2015 1226    HGBUR LARGE (A) 09/07/2015 1226   BILIRUBINUR NEGATIVE 09/07/2015 1226   KETONESUR NEGATIVE 09/07/2015 1226   PROTEINUR 30 (A) 09/07/2015 1226   UROBILINOGEN 0.2 06/22/2010 1226   NITRITE NEGATIVE 09/07/2015 1226   LEUKOCYTESUR LARGE (A) 09/07/2015 1226   Sepsis Labs: _0 (procalcitonin:4,lacticidven:4)  ) Recent Results (from the past 240 hour(s))  Urine culture     Status: Abnormal   Collection Time: 09/07/15 12:26 PM  Result Value Ref Range Status   Specimen Description URINE, CATHETERIZED  Final   Special Requests NONE  Final   Culture 50,000 COLONIES/mL ESCHERICHIA COLI (A)  Final   Report Status 09/09/2015 FINAL  Final   Organism ID, Bacteria ESCHERICHIA COLI (A)  Final      Susceptibility   Escherichia coli - MIC*    AMPICILLIN <=2 SENSITIVE Sensitive     CEFAZOLIN <=4 SENSITIVE Sensitive     CEFTRIAXONE <=1 SENSITIVE Sensitive     CIPROFLOXACIN <=0.25 SENSITIVE Sensitive     GENTAMICIN <=1 SENSITIVE Sensitive     IMIPENEM <=0.25 SENSITIVE Sensitive     NITROFURANTOIN <=16 SENSITIVE Sensitive     TRIMETH/SULFA <=20 SENSITIVE Sensitive     AMPICILLIN/SULBACTAM <=2 SENSITIVE Sensitive     PIP/TAZO <=4 SENSITIVE Sensitive     Extended ESBL NEGATIVE Sensitive     * 50,000 COLONIES/mL ESCHERICHIA COLI  Culture, blood (Routine x 2)     Status: None   Collection Time: 09/07/15  1:20 PM  Result Value Ref Range Status   Specimen Description BLOOD RIGHT FOREARM  Final   Special Requests IN PEDIATRIC BOTTLE  3CC  Final   Culture NO GROWTH 5 DAYS  Final   Report Status 09/12/2015 FINAL  Final  Culture, blood (Routine x 2)  Status: None   Collection Time: 09/07/15  1:38 PM  Result Value Ref Range Status   Specimen Description BLOOD LEFT HAND  Final   Special Requests IN PEDIATRIC BOTTLE  1CC  Final   Culture NO GROWTH 5 DAYS  Final   Report Status 09/12/2015 FINAL  Final  MRSA PCR Screening     Status: None   Collection Time: 09/07/15  6:12 PM  Result Value  Ref Range Status   MRSA by PCR NEGATIVE NEGATIVE Final    Comment:        The GeneXpert MRSA Assay (FDA approved for NASAL specimens only), is one component of a comprehensive MRSA colonization surveillance program. It is not intended to diagnose MRSA infection nor to guide or monitor treatment for MRSA infections.       Radiology Studies: No results found.   Scheduled Meds: . aspirin  300 mg Rectal Daily  . enoxaparin (LOVENOX) injection  30 mg Subcutaneous Q24H  . insulin aspart  0-9 Units Subcutaneous Q4H  . prednisoLONE acetate  1 drop Both Eyes QID   Continuous Infusions: . dextrose 5 % and 0.45 % NaCl with KCl 20 mEq/L 75 mL/hr at 09/13/15 0559  . feeding supplement (JEVITY 1.2 CAL)       LOS: 6 days    Time spent: 30 min    Janece Canterbury, MD [facility] Pager [phone]  If 7PM-7AM, please contact night-coverage [email or web address] Password [email or web address] 09/13/2015, 4:16 PM

## 2015-09-13 NOTE — Progress Notes (Signed)
Beulah Gandy, Rapid Response RN conducted NIH Stroke screen for this shift at 1600.

## 2015-09-14 DIAGNOSIS — I639 Cerebral infarction, unspecified: Secondary | ICD-10-CM

## 2015-09-14 LAB — GLUCOSE, CAPILLARY
GLUCOSE-CAPILLARY: 135 mg/dL — AB (ref 65–99)
GLUCOSE-CAPILLARY: 172 mg/dL — AB (ref 65–99)
Glucose-Capillary: 144 mg/dL — ABNORMAL HIGH (ref 65–99)
Glucose-Capillary: 153 mg/dL — ABNORMAL HIGH (ref 65–99)
Glucose-Capillary: 164 mg/dL — ABNORMAL HIGH (ref 65–99)

## 2015-09-14 MED ORDER — BISACODYL 10 MG RE SUPP: 10.0000 mg | Freq: Every day | RECTAL | Status: DC | PRN

## 2015-09-14 MED ORDER — BISACODYL 10 MG RE SUPP: 10.0000 mg | Freq: Every day | RECTAL | 0 refills | Status: AC | PRN

## 2015-09-14 NOTE — Clinical Social Work Note (Signed)
CSW notified by RN that pt medically stable for discharge. CSW contacted pt daughter and informed pt discharging from hospital. Pt daughter stated she wants pt to return to [facility] with hospice. CSW contacted SNF and informed that pt discharging today and will need hospice. CSW contacted [facility] to transport pt and requested nurse call SNF and give report.

## 2015-09-14 NOTE — Progress Notes (Addendum)
 PROGRESS NOTE  Paula West  MRN:[id number] DOB: 04/15/1932 DOA: 09/07/2015 PCP: No PCP Per Patient  Brief Narrative:   Paula West is an 82 y.o. female who suffered an acute left thalamic infarct in late June.  She was hospitalized a few weeks later for chest pain and elevated troponin at which time plavix was added.  She was rehospitalized in early August for changes in speech and increased lethargy at which time she was found to be dehydrated with AKI with a creatinine of 4.  She was rehydrated and discharged to home.  She met with Dr. Ahern, Neurology on 8/15, who noted that she had not had any improvement in her stroke symptoms and in fact had increasing confusion, decreased oral intake, and he was concerned that she may need a feeding tube.  She was admitted 09/07/15 from SNF due to decreased responsiveness and lethargy.  She had recently been started on antibiotics at SNF for a UTI. Upon initial presentation, the patient was found to have acute kidney injury with a creatinine of 2.  Since admission she has not had anything to eat or drink. She met with speech therapy who recommended a dysphagia 1 diet with thin liquids, however she refuses to eat. Palliative care was consulted and met with the family today. After prolonged discussion, she was transitioned to DO NOT RESUSCITATE status.  After reviewing records on 8/23, notes suggested that she had been speaking and function.  Family has decided to pursue hospice care.  Awaiting residential hospice.   Assessment & Plan:   Principal Problem:   AKI (acute kidney injury) (HCC) Active Problems:   COPD (chronic obstructive pulmonary disease) (HCC)   Hyperlipidemia   Diabetes mellitus, controlled (HCC)   Essential hypertension   Thalamic infarction (June 2017 w/ residual dysarthria/FTT)   CKD (chronic kidney disease), stage III   Dehydration   UTI (lower urinary tract infection)   Metabolic encephalopathy   Recent NSTEMI (non-ST elevated  myocardial infarction-July 2017) (HCC)   Right Adrenal nodule (HCC)   Altered mental status   Protein-calorie malnutrition, severe   Palliative care encounter   DNR (do not resuscitate)   Poor appetite   Dysphagia   Embolic stroke involving cerebral artery (HCC)   Lethargy, metabolic encephalopathy due to UTI, dehydration, and acute/subacute strokes superimposed on recent thalamic infarct in June.  She is unable to communicate or eat safely.  Right hemiparesis.  Family has elected comfort measures and hospice. -  MRI brain:  Multiple areas of acute and early subacute infarct in multiple vascular territories with the largest lesion in the left posterior frontal lobe involving the precentral gyrus. Findings suggestive of embolic disease. -  Neurology consultation appreciated -  Palliative care consultation appreciated -  Awaiting residential hospice placement  Dysphagia with severe protein calorie malnutrition -  Dysphagia 1 with thin liquids for comfort > patient refusing  AKI (acute kidney injury) secondary to dehydration, in the setting of stage III CKD, resolved. -  No further labs  Hypokalemia, resolved.  decreased potassium to IVF  UTI (lower urinary tract infection), resolved.  COPD (chronic obstructive pulmonary disease), stable, continue bronchodilators as needed.  Diabetes mellitus, controlled  -  Hold Tradjenta  - continue SSI - A1c was 8.4 on 7/15  Recent NSTEMI (non-ST elevated myocardial infarction-July 2017), comfort measures  Essential hypertension Current blood pressures are elevated.  Comfort measures  Right Adrenal nodule, 3cm with indeterminate Hounsfield unit measurements and poorly defined margins -  No further   work up.    Enlarged left ovary -  No further work up  DVT prophylaxis:  lovenox Code Status:  DNR Family Communication:  Patient alone  Disposition Plan:  To residential hospice when bed available.  SW consult placed.     Consultants:   Palliative care  Neurology  Procedures:  none  Antimicrobials:   Ciprofloxacin 8/21 - 8.24   Subjective: Unable to communicate.  Had BM yesterday.    Objective: Vitals:   09/13/15 2021 09/14/15 0455 09/14/15 0805 09/14/15 1124  BP: (!) 161/69 (!) 164/74 (!) 163/71 (!) 156/65  Pulse: 62 (!) 58 64 65  Resp: _0 Temp: 97.8 F (36.6 C) 97.8 F (36.6 C)  97.6 F (36.4 C)  TempSrc: Oral Oral  Axillary  SpO2: 100% 98% 96% 100%  Weight:  71.2 kg (157 lb)    Height:        Intake/Output Summary (Last 24 hours) at 09/14/15 1205 Last data filed at 09/14/15 0317  Gross per 24 hour  Intake          2533.75 ml  Output                0 ml  Net          2533.75 ml   Filed Weights   09/12/15 0408 09/13/15 0500 09/14/15 0455  Weight: 67.3 kg (148 lb 6.4 oz) 63.1 kg (139 lb 3.2 oz) 71.2 kg (157 lb)    Examination:  General exam:  Adult female, sleeping HEENT:  NCAT, MMM Respiratory system: Clear to auscultation bilaterally Cardiovascular system: Regular rate and rhythm, normal S1/S2. No murmurs, rubs, gallops or clicks.  Warm extremities Gastrointestinal system: Normal active bowel sounds, soft, nondistended, nontender. MSK:  Decreased tone and bulk, no lower extremity edema Neuro:  Mild right facial droop.  Ignores right hand and right leg.  Flaccid right arm.  2 out of 5 strength of the right lower extremity.  Moves left arm and left leg spontaneously.       Data Reviewed: I have personally reviewed following labs and imaging studies  CBC:  Recent Labs Lab 09/07/15 1320 09/08/15 0540 09/10/15 0517 09/11/15 0811 09/12/15 0635 09/13/15 0613  WBC 8.8 9.0 12.2* 10.3 10.9* 10.6*  NEUTROABS 6.0  --   --   --   --   --   HGB 13.5 14.1 12.5 14.8 12.6 12.2  HCT 41.4 43.3 38.5 44.6 38.4 37.7  MCV 80.7 80.0 79.4 78.9 80.5 79.5  PLT 130* 121* 170 126* 148* 828   Basic Metabolic Panel:  Recent Labs Lab 09/07/15 1918  09/09/15 0442  09/10/15 0517 09/11/15 0811 09/12/15 0635 09/13/15 0613  NA  --   < > 142 140 141 141 132*  K  --   < > 3.3* 3.1* 3.6 4.5 4.7  CL  --   < > 114* 111 109 112* 101  CO2  --   < > 18* 18* 19* 20* 23  GLUCOSE  --   < > 113* 129* 142* 145* 181*  BUN  --   < > 26* _1 CREATININE  --   < > 1.30* 1.28* 1.40* 1.21* 1.16*  CALCIUM  --   < > 8.6* 8.9 8.9 8.7* 8.2*  MG 2.0  --   --   --   --   --   --   PHOS 2.2*  --   --   --   --   --   --   < > =  values in this interval not displayed. GFR: Estimated Creatinine Clearance: 37.4 mL/min (by C-G formula based on SCr of 1.16 mg/dL). Liver Function Tests:  Recent Labs Lab 09/07/15 1320  AST 22  ALT 25  ALKPHOS 113  BILITOT 0.7  PROT 6.6  ALBUMIN 2.9*   No results for input(s): LIPASE, AMYLASE in the last 168 hours.  Recent Labs Lab 09/10/15 0409  AMMONIA 25   Coagulation Profile:  Recent Labs Lab 09/11/15 1116  INR 1.24   Cardiac Enzymes: No results for input(s): CKTOTAL, CKMB, CKMBINDEX, TROPONINI in the last 168 hours. BNP (last 3 results) No results for input(s): PROBNP in the last 8760 hours. HbA1C: No results for input(s): HGBA1C in the last 72 hours. CBG:  Recent Labs Lab 09/13/15 2020 09/14/15 0042 09/14/15 0454 09/14/15 0759 09/14/15 1123  GLUCAP 142* 164* 153* 135* 144*   Lipid Profile:  Recent Labs  09/12/15 0635  CHOL 120  HDL 22*  LDLCALC 71  TRIG 135  CHOLHDL 5.5   Thyroid Function Tests: No results for input(s): TSH, T4TOTAL, FREET4, T3FREE, THYROIDAB in the last 72 hours. Anemia Panel: No results for input(s): VITAMINB12, FOLATE, FERRITIN, TIBC, IRON, RETICCTPCT in the last 72 hours. Urine analysis:    Component Value Date/Time   COLORURINE YELLOW 09/07/2015 1226   APPEARANCEUR TURBID (A) 09/07/2015 1226   LABSPEC 1.020 09/07/2015 1226   PHURINE 5.0 09/07/2015 1226   GLUCOSEU 100 (A) 09/07/2015 1226   HGBUR LARGE (A) 09/07/2015 1226   BILIRUBINUR NEGATIVE 09/07/2015 1226    KETONESUR NEGATIVE 09/07/2015 1226   PROTEINUR 30 (A) 09/07/2015 1226   UROBILINOGEN 0.2 06/22/2010 1226   NITRITE NEGATIVE 09/07/2015 1226   LEUKOCYTESUR LARGE (A) 09/07/2015 1226   Sepsis Labs: @LABRCNTIP(procalcitonin:4,lacticidven:4)  ) Recent Results (from the past 240 hour(s))  Urine culture     Status: Abnormal   Collection Time: 09/07/15 12:26 PM  Result Value Ref Range Status   Specimen Description URINE, CATHETERIZED  Final   Special Requests NONE  Final   Culture 50,000 COLONIES/mL ESCHERICHIA COLI (A)  Final   Report Status 09/09/2015 FINAL  Final   Organism ID, Bacteria ESCHERICHIA COLI (A)  Final      Susceptibility   Escherichia coli - MIC*    AMPICILLIN <=2 SENSITIVE Sensitive     CEFAZOLIN <=4 SENSITIVE Sensitive     CEFTRIAXONE <=1 SENSITIVE Sensitive     CIPROFLOXACIN <=0.25 SENSITIVE Sensitive     GENTAMICIN <=1 SENSITIVE Sensitive     IMIPENEM <=0.25 SENSITIVE Sensitive     NITROFURANTOIN <=16 SENSITIVE Sensitive     TRIMETH/SULFA <=20 SENSITIVE Sensitive     AMPICILLIN/SULBACTAM <=2 SENSITIVE Sensitive     PIP/TAZO <=4 SENSITIVE Sensitive     Extended ESBL NEGATIVE Sensitive     * 50,000 COLONIES/mL ESCHERICHIA COLI  Culture, blood (Routine x 2)     Status: None   Collection Time: 09/07/15  1:20 PM  Result Value Ref Range Status   Specimen Description BLOOD RIGHT FOREARM  Final   Special Requests IN PEDIATRIC BOTTLE  3CC  Final   Culture NO GROWTH 5 DAYS  Final   Report Status 09/12/2015 FINAL  Final  Culture, blood (Routine x 2)     Status: None   Collection Time: 09/07/15  1:38 PM  Result Value Ref Range Status   Specimen Description BLOOD LEFT HAND  Final   Special Requests IN PEDIATRIC BOTTLE  1CC  Final   Culture NO GROWTH 5 DAYS  Final     Report Status 09/12/2015 FINAL  Final  MRSA PCR Screening     Status: None   Collection Time: 09/07/15  6:12 PM  Result Value Ref Range Status   MRSA by PCR NEGATIVE NEGATIVE Final    Comment:        The  GeneXpert MRSA Assay (FDA approved for NASAL specimens only), is one component of a comprehensive MRSA colonization surveillance program. It is not intended to diagnose MRSA infection nor to guide or monitor treatment for MRSA infections.       Radiology Studies: No results found.   Scheduled Meds: . insulin aspart  0-9 Units Subcutaneous Q4H  . prednisoLONE acetate  1 drop Both Eyes QID   Continuous Infusions: . dextrose 5 % and 0.45 % NaCl with KCl 20 mEq/L 75 mL/hr at 09/13/15 1734     LOS: 7 days    Time spent: 30 min    , , MD [facility] Pager [phone]  If 7PM-7AM, please contact night-coverage [email or web address] Password [email or web address] 09/14/2015, 12:05 PM          

## 2015-09-14 NOTE — Clinical Social Work Placement (Signed)
   CLINICAL SOCIAL WORK PLACEMENT  NOTE  Date:  09/14/2015  Patient Details  Name: Paula West MRN: [id number] Date of Birth: 1932-03-06  Clinical Social Work is seeking post-discharge placement for this patient at the [place] level of care (*CSW will initial, date and re-position this form in  chart as items are completed):  Yes   Patient/family provided with [facility] Work Department's list of facilities offering this level of care within the geographic area requested by the patient (or if unable, by the patient's family).  Yes   Patient/family informed of their freedom to choose among providers that offer the needed level of care, that participate in Medicare, Medicaid or managed care program needed by the patient, have an available bed and are willing to accept the patient.  Yes   Patient/family informed of [facility]'s ownership interest in [facility] and [facility], as well as of the fact that they are under no obligation to receive care at these facilities.  PASRR submitted to EDS on       PASRR number received on       Existing PASRR number confirmed on 09/14/15     FL2 transmitted to all facilities in geographic area requested by pt/family on 09/14/15     FL2 transmitted to all facilities within larger geographic area on       Patient informed that his/her managed care company has contracts with or will negotiate with certain facilities, including the following:            Patient/family informed of bed offers received.  Patient chooses bed at       Physician recommends and patient chooses bed at      Patient to be transferred to   on  .  Patient to be transferred to facility by       Patient family notified on   of transfer.  Name of family member notified:        PHYSICIAN Please prepare prescriptions, Please prepare priority discharge summary, including medications, Please sign FL2, Please sign DNR      Additional Comment:    _______________________________________________ Junie Spencer, LCSW 09/14/2015, 4:30 PM

## 2015-09-14 NOTE — Clinical Social Work Note (Signed)
Clinical Social Work Assessment  Patient Details  Name: Paula West MRN: [id number] Date of Birth: [birth year]  Date of referral:  09/14/15               Reason for consult:  Discharge Planning                Permission sought to share information with:  Case Manager, Facility Sport and exercise psychologist, Family Supports Permission granted to share information::  No  Name::        Agency::   (SNF)  Relationship::     Contact Information:     Housing/Transportation Living arrangements for the past 2 months:  [place] of Information:  Medical Team, Adult Children Patient Interpreter Needed:  None Criminal Activity/Legal Involvement Pertinent to Current Situation/Hospitalization:  No - Comment as needed Significant Relationships:  Adult Children Lives with:  Facility Resident Do you feel safe going back to the place where you live?  No Need for family participation in patient care:  Yes (Comment)  Care giving concerns: Pt unable to participate in assessment. CSW spoke with pt daughter via phone.    Social Worker assessment / plan: Pt not able to participate in assessment due to disposition. CSW spoke with pt daughter via phone and discussed CSW role with discharge planning. CSW also explained PT recommendation for hospice. Pt daughter agreeable to SNF with hospice. Dtr also stated that pt was admitted to the hospital from [facility] and that she wants her mother to return to [facility].   Employment status:  Retired Forensic scientist:  Other (Comment Required) ([employer] ) PT Recommendations:    Information / Referral to community resources:  [place]  Patient/Family's Response to care: Pt daughter appears happy with care her mother is receiving at [employer].   Patient/Family's Understanding of and Emotional Response to Diagnosis, Current Treatment, and Prognosis: Pt not able to participate in assessment. Pt  daughter appears to have a good understanding of reason for pt admission into hospital and pt care plan.   Emotional Assessment Appearance:  Appears stated age Attitude/Demeanor/Rapport:  Unable to Assess Affect (typically observed):  Unable to Assess Orientation:    Alcohol / Substance use:  Not Applicable Psych involvement (Current and /or in the community):  No (Comment)  Discharge Needs  Concerns to be addressed:  Discharge Planning Concerns Readmission within the last 30 days:  No Current discharge risk:  None Barriers to Discharge:  Continued Medical Work up   [employer], LCSW 09/14/2015, 4:12 PM

## 2015-09-14 NOTE — Progress Notes (Signed)
Attempted to feed patient per request of family member; attempt unsuccessful.  Patient attempts to move your hand away.  Significant other satisfied with the attempt.  Reassured significant other additional attempts will be made if patient is agreeable.

## 2015-09-14 NOTE — Discharge Summary (Signed)
Physician Discharge Summary  Paula West [id number] DOB: Dec 18, 1932 DOA: 09/07/2015  PCP: No PCP Per Patient  Admit date: 09/07/2015 Discharge date: pending  Admitted From:  SNF Disposition:  Residential hospice  Discharge Condition:  Fair, guarded CODE STATUS:  DNR  Diet recommendation:   Dysphagia 1 with thin liquids  Brief/Interim Summary:  Paula West an 82 y.o.femalewho suffered an acute left thalamic infarct in late June.  She was hospitalized a few weeks later for chest pain and elevated troponin at which time plavix was added.  She was rehospitalized in early August for changes in speech and increased lethargy at which time she was found to be dehydrated with AKI with a creatinine of 4.  She was rehydrated and discharged to home.  She met with Dr. Jaynee Eagles, Neurology on 8/15, who noted that she had not had any improvement in her stroke symptoms and in fact had increasing confusion, decreased oral intake, and he was concerned that she may need a feeding tube.  She was admitted 09/07/15 from SNF due to decreased responsiveness and lethargy.  She had recently been started on antibiotics at SNF for a UTI. Upon initial presentation, the patient was found to have acute kidney injury with a creatinine of 2.  She was treated with antibiotics for UTI and IVF.  Despite rehydration and treatment of her UTI, she continued to have profound right sided weakness, inability to speak or communicate, and dysphagia.  She was offered a dysphagia 1 diet but refused to eat and drink.  MRI of the brain was obtained because of her poor response to antibiotics and IVF and it demonstrated acute and subacute embolic strokes.  Neurology was consulted and at this time did not recommend further work up unless the family wanted to pursue Holter monitor.  The possibility of PEG tube was offered.  Palliative care was consulted and met with the family. After prolonged discussion, she was transitioned to DO NOT  RESUSCITATE status and the family decided to pursue hospice care.    Discharge Diagnoses:  Principal Problem:   Acute embolic stroke [facility]) Active Problems:   COPD (chronic obstructive pulmonary disease) ([place])   Hyperlipidemia   Diabetes mellitus, controlled ([place])   Essential hypertension   Thalamic infarction (June 2017 w/ residual dysarthria/FTT)   CKD (chronic kidney disease), stage III   AKI (acute kidney injury) ([place])   Dehydration   UTI (lower urinary tract infection)   Metabolic encephalopathy   Recent NSTEMI (non-ST elevated myocardial infarction-July 2017) ([place])   Right Adrenal nodule (HCC)   Altered mental status   Protein-calorie malnutrition, severe   Palliative care encounter   DNR (do not resuscitate)   Poor appetite   Dysphagia   Embolic stroke involving cerebral artery (HCC)   Metabolic encephalopathy due to UTI, dehydration, and acute/subacute embolic strokes superimposed on recent thalamic infarct in June.  She was treated with IVF and antibiotics.  Family elected comfort measures as we do not expect significant improvement from her current state.  She is not eating or drinking anything and therefore has a limited prognosis of days to weeks.  Awaiting residential hospice placement  Dysphagia with severe protein calorie malnutrition.  Met with speech therapy.  Dysphagia 1 with thin liquids for comfort > patient refusing  AKI (acute kidney injury) secondary to dehydration, in the setting of stage III CKD, resolved.  Hypokalemia, resolved with potassium in IVF.  E. coliUTI (lower urinary tract infection), treated with three days of ciprofloxacin and had received  antibiotics at SNF prior to admission, resolved.  COPD (chronic obstructive pulmonary disease), stable, continue bronchodilators as needed.  Diabetes mellitus, controlled.  A1c was 8.4 on 7/15.  Tradjenta was held.  Given SSI but requiring very little insulin so CBG and insulin were  discontinued for comfort.    Recent NSTEMI (non-ST elevated myocardial infarction-July 2017), comfort measures  Essential hypertension, current blood pressures are elevated.  Comfort measures  Right Adrenal nodule, 3cm with indeterminate Hounsfield unit measurements and poorly defined margins.  No further work up.    Enlarged left ovary, No further work up  Discharge Instructions  Discharge Instructions    Call MD for:  difficulty breathing, headache or visual disturbances    Complete by:  As directed   Call MD for:  hives    Complete by:  As directed   Call MD for:  persistant nausea and vomiting    Complete by:  As directed   Call MD for:  severe uncontrolled pain    Complete by:  As directed   Call MD for:  temperature >100.4    Complete by:  As directed   Increase activity slowly    Complete by:  As directed       Medication List    STOP taking these medications   aspirin 81 MG tablet   atorvastatin 40 MG tablet Commonly known as:  LIPITOR   carvedilol 12.5 MG tablet Commonly known as:  COREG   CEFTRIAXONE SODIUM IV   clopidogrel 75 MG tablet Commonly known as:  PLAVIX   COLCRYS 0.6 MG tablet Generic drug:  colchicine   gabapentin 300 MG capsule Commonly known as:  NEURONTIN   losartan-hydrochlorothiazide 100-25 MG tablet Commonly known as:  HYZAAR   omeprazole 20 MG capsule Commonly known as:  PRILOSEC   PROAIR HFA 108 (90 Base) MCG/ACT inhaler Generic drug:  albuterol   ranitidine 300 MG tablet Commonly known as:  ZANTAC   sucralfate 1 GM/10ML suspension Commonly known as:  CARAFATE   TRADJENTA 5 MG Tabs tablet Generic drug:  linagliptin   traMADol 50 MG tablet Commonly known as:  ULTRAM     TAKE these medications   bisacodyl 10 MG suppository Commonly known as:  DULCOLAX Place 1 suppository (10 mg total) rectally daily as needed for mild constipation.   prednisoLONE acetate 1 % ophthalmic suspension Commonly known as:  PRED  FORTE Place 1 drop into both eyes 4 (four) times daily.       Allergies  Allergen Reactions  . Codeine Rash  . Darvon [Propoxyphene] Rash  . Penicillins Rash    Has patient had a PCN reaction causing immediate rash, facial/tongue/throat swelling, SOB or lightheadedness with hypotension: Yes Has patient had a PCN reaction causing severe rash involving mucus membranes or skin necrosis: No Has patient had a PCN reaction that required hospitalization No Has patient had a PCN reaction occurring within the last 10 years: No If all of the above answers are "NO", then may proceed with Cephalosporin use.     Consultants:   Palliative care  Neurology   Procedures/Studies: Dg Chest 2 View  Result Date: 08/22/2015 CLINICAL DATA:  Altered mental status EXAM: CHEST  2 VIEW COMPARISON:  08/01/2015 FINDINGS: Cardiac shadow is not enlarged and accentuated by AP technique. No infiltrate or sizable effusion is seen. No acute bony abnormality is noted. Degenerative changes of the thoracic spine are seen. IMPRESSION: No active cardiopulmonary disease. Electronically Signed   By: Linus Mako.D.  On: 08/22/2015 15:14   Ct Head Wo Contrast  Result Date: 08/22/2015 CLINICAL DATA:  82 year old brought in for altered mental status and unresponsiveness. EXAM: CT HEAD WITHOUT CONTRAST TECHNIQUE: Contiguous axial images were obtained from the base of the skull through the vertex without intravenous contrast. COMPARISON:  07/11/2015 FINDINGS: There is moderate chronic ventriculomegaly which appears similar to previous exam. As described previously this remains outer for portion to the degree of sulcal enlargement. Patchy areas of low attenuation throughout the subcortical and periventricular white matter are identified compatible with chronic microvascular disease. Chronic left basal ganglia lacunar infarct is noted. No evidence for acute brain infarct. No intracranial hemorrhage or mass identified. The  paranasal sinuses and mastoid air cells appear clear. The calvarium is intact. IMPRESSION: 1. No acute intracranial abnormalities. 2. Chronic, moderate ventriculomegaly 3. Chronic small vessel ischemic change. Electronically Signed   By: Kerby Moors M.D.   On: 08/22/2015 15:10   Mr Brain Wo Contrast  Result Date: 09/10/2015 CLINICAL DATA:  82 y/o F; patient presenting with decreased responsiveness and lethargy being treated for urinary tract infection. History of recent cerebrovascular accident, diabetes, chronic kidney disease, and COPD. EXAM: MRI HEAD WITHOUT CONTRAST TECHNIQUE: Multiplanar, multiecho pulse sequences of the brain and surrounding structures were obtained without intravenous contrast. COMPARISON:  CT head 08/22/2015 and MR head 07/11/2015 FINDINGS: Brain: Small area of diffusion restriction in the left posterior and lateral frontal low consistent with acute/ early subacute infarct. There is associated mild T2 FLAIR hyperintensity. There are several additional punctate infarcts scattered throughout the left frontal and parietal lobes, within the left thalamus, left occipital lobe, right caudate head, right anterior frontal periventricular white matter, right posterior parietal lobe, right occipital lobe cortex, and within the left greater than right cerebellar hemispheres. The susceptibility weighted sequence is severely motion degraded, there is no large gross focus of susceptibility hypointensity to suggest hemorrhage. There is extensive confluent T2 FLAIR hyperintense signal abnormality in supratentorial white matter compatible with advanced chronic microvascular ischemic changes. There is moderate parenchymal volume loss. Extra-axial space: Ventriculomegaly appears disproportional to volume loss probably representing idiopathic normal pressure hydrocephalus. No midline shift. No effacement of basilar cisterns. No extra-axial collection is identified. Proximal intracranial flow voids are  maintained. No abnormality of the cervical medullary junction. Other: No abnormal signal of the paranasal sinuses. No abnormal signal of the mastoid air cells. Bilateral intra-ocular lens replacement. Calvarium is unremarkable. IMPRESSION: 1. Multiple areas of acute/early subacute infarct in multiple vascular territories with the largest lesion in the left posterior frontal lobe involving the precentral gyrus. No associated hemorrhage is identified. This is probably related to embolic disease given multiple vascular territories. 2. Stable ventriculomegaly that appears disproportional to volume loss, possibly representing idiopathic normal pressure hydrocephalus. 3. Advanced chronic microvascular ischemic changes and moderate brain parenchymal volume loss. These results will be called to the ordering clinician or representative by the Radiologist Assistant, and communication documented in the PACS or zVision Dashboard. Electronically Signed   By: Kristine Garbe M.D.   On: 09/10/2015 23:39   US Renal  Result Date: 08/23/2015 CLINICAL DATA:  82 year old female with a history of acute renal failure EXAM: RENAL / URINARY TRACT ULTRASOUND COMPLETE COMPARISON:  CT 08/02/2015 FINDINGS: Right Kidney: Length: 9.4 cm. Echogenicity similar to that of the adjacent liver. No hydronephrosis. No large echogenic focus of the right kidney. Left Kidney: Length: 9.7 cm. Echogenicity of the left kidney similar to that of the adjacent spleen parenchyma and of the contralateral kidney.  No hydronephrosis. No large echogenic focus. Bladder: Appears normal for degree of bladder distention. IMPRESSION: No evidence of hydronephrosis. Signed, Dulcy Fanny. Earleen Newport, DO [facility] Radiology Electronically Signed   By: Corrie Mckusick D.O.   On: 08/23/2015 10:58   Dg Chest Portable 1 View  Result Date: 09/07/2015 CLINICAL DATA:  Altered mental status EXAM: PORTABLE CHEST 1 VIEW COMPARISON:   August 22, 2015 FINDINGS: There is no edema or consolidation. Heart is mildly enlarged with pulmonary vascularity within normal limits. No adenopathy. No bone lesions. IMPRESSION: Mild cardiac enlargement.  No edema or consolidation. Electronically Signed   By: Lowella Grip III M.D.   On: 09/07/2015 13:14    Subjective: Sleeping today.  Nonverbal.  Discharge Exam: Vitals:   09/14/15 0805 09/14/15 1124  BP: (!) 163/71 (!) 156/65  Pulse: 64 65  Resp:  16  Temp:  97.6 F (36.4 C)   Vitals:   09/13/15 2021 09/14/15 0455 09/14/15 0805 09/14/15 1124  BP: (!) 161/69 (!) 164/74 (!) 163/71 (!) 156/65  Pulse: 62 (!) 58 64 65  Resp: 16 16  16   Temp: 97.8 F (36.6 C) 97.8 F (36.6 C)  97.6 F (36.4 C)  TempSrc: Oral Oral  Axillary  SpO2: 100% 98% 96% 100%  Weight:  71.2 kg (157 lb)    Height:       General exam:  Adult female, sleeping HEENT:  NCAT, MMM Respiratory system: Clear to auscultation bilaterally Cardiovascular system: Regular rate and rhythm, normal S1/S2. No murmurs, rubs, gallops or clicks.  Warm extremities Gastrointestinal system: Normal active bowel sounds, soft, nondistended, nontender. MSK:  Decreased tone and bulk, no lower extremity edema Neuro:  Mild right facial droop.  Ignores right hand and right leg.  Flaccid right arm.  2 out of 5 strength of the right lower extremity.  Moves left arm and left leg spontaneously.       The results of significant diagnostics from this hospitalization (including imaging, microbiology, ancillary and laboratory) are listed below for reference.     Microbiology: Recent Results (from the past 240 hour(s))  Urine culture     Status: Abnormal   Collection Time: 09/07/15 12:26 PM  Result Value Ref Range Status   Specimen Description URINE, CATHETERIZED  Final   Special Requests NONE  Final   Culture 50,000 COLONIES/mL ESCHERICHIA COLI (A)  Final   Report Status 09/09/2015 FINAL  Final   Organism ID, Bacteria ESCHERICHIA  COLI (A)  Final      Susceptibility   Escherichia coli - MIC*    AMPICILLIN <=2 SENSITIVE Sensitive     CEFAZOLIN <=4 SENSITIVE Sensitive     CEFTRIAXONE <=1 SENSITIVE Sensitive     CIPROFLOXACIN <=0.25 SENSITIVE Sensitive     GENTAMICIN <=1 SENSITIVE Sensitive     IMIPENEM <=0.25 SENSITIVE Sensitive     NITROFURANTOIN <=16 SENSITIVE Sensitive     TRIMETH/SULFA <=20 SENSITIVE Sensitive     AMPICILLIN/SULBACTAM <=2 SENSITIVE Sensitive     PIP/TAZO <=4 SENSITIVE Sensitive     Extended ESBL NEGATIVE Sensitive     * 50,000 COLONIES/mL ESCHERICHIA COLI  Culture, blood (Routine x 2)     Status: None   Collection Time: 09/07/15  1:20 PM  Result Value Ref Range Status   Specimen Description BLOOD RIGHT FOREARM  Final   Special Requests IN PEDIATRIC BOTTLE  3CC  Final   Culture NO GROWTH 5 DAYS  Final   Report Status 09/12/2015 FINAL  Final  Culture, blood (Routine x 2)     Status: None   Collection Time: 09/07/15  1:38 PM  Result Value Ref Range Status   Specimen Description BLOOD LEFT HAND  Final   Special Requests IN PEDIATRIC BOTTLE  1CC  Final   Culture NO GROWTH 5 DAYS  Final   Report Status 09/12/2015 FINAL  Final  MRSA PCR Screening     Status: None   Collection Time: 09/07/15  6:12 PM  Result Value Ref Range Status   MRSA by PCR NEGATIVE NEGATIVE Final    Comment:        The GeneXpert MRSA Assay (FDA approved for NASAL specimens only), is one component of a comprehensive MRSA colonization surveillance program. It is not intended to diagnose MRSA infection nor to guide or monitor treatment for MRSA infections.      Labs: BNP (last 3 results) No results for input(s): BNP in the last 8760 hours. Basic Metabolic Panel:  Recent Labs Lab 09/07/15 1918  09/09/15 0442 09/10/15 0517 09/11/15 0811 09/12/15 0635 09/13/15 0613  NA  --   < > 142 140 141 141 132*  K  --   < > 3.3* 3.1* 3.6 4.5 4.7  CL  --   < > 114* 111 109 112* 101  CO2  --   < > 18* 18* 19* 20* 23   GLUCOSE  --   < > 113* 129* 142* 145* 181*  BUN  --   < > 26* 20 19 14 11   CREATININE  --   < > 1.30* 1.28* 1.40* 1.21* 1.16*  CALCIUM  --   < > 8.6* 8.9 8.9 8.7* 8.2*  MG 2.0  --   --   --   --   --   --   PHOS 2.2*  --   --   --   --   --   --   < > = values in this interval not displayed. Liver Function Tests: No results for input(s): AST, ALT, ALKPHOS, BILITOT, PROT, ALBUMIN in the last 168 hours. No results for input(s): LIPASE, AMYLASE in the last 168 hours.  Recent Labs Lab 09/10/15 0409  AMMONIA 25   CBC:  Recent Labs Lab 09/08/15 0540 09/10/15 0517 09/11/15 0811 09/12/15 0635 09/13/15 0613  WBC 9.0 12.2* 10.3 10.9* 10.6*  HGB 14.1 12.5 14.8 12.6 12.2  HCT 43.3 38.5 44.6 38.4 37.7  MCV 80.0 79.4 78.9 80.5 79.5  PLT 121* 170 126* 148* 179   Cardiac Enzymes: No results for input(s): CKTOTAL, CKMB, CKMBINDEX, TROPONINI in the last 168 hours. BNP: Invalid input(s): POCBNP CBG:  Recent Labs Lab 09/13/15 2020 09/14/15 0042 09/14/15 0454 09/14/15 0759 09/14/15 1123  GLUCAP 142* 164* 153* 135* 144*   D-Dimer No results for input(s): DDIMER in the last 72 hours. Hgb A1c No results for input(s): HGBA1C in the last 72 hours. Lipid Profile  Recent Labs  09/12/15 0635  CHOL 120  HDL 22*  LDLCALC 71  TRIG 135  CHOLHDL 5.5   Thyroid function studies No results for input(s): TSH, T4TOTAL, T3FREE, THYROIDAB in the last 72 hours.  Invalid input(s): FREET3 Anemia work up No results for input(s): VITAMINB12, FOLATE, FERRITIN, TIBC, IRON, RETICCTPCT in the last 72 hours. Urinalysis    Component Value Date/Time   COLORURINE YELLOW 09/07/2015 1226   APPEARANCEUR TURBID (A) 09/07/2015 1226   LABSPEC 1.020 09/07/2015 1226   PHURINE 5.0 09/07/2015 1226   GLUCOSEU 100 (A) 09/07/2015 1226  HGBUR LARGE (A) 09/07/2015 1226   BILIRUBINUR NEGATIVE 09/07/2015 1226   KETONESUR NEGATIVE 09/07/2015 1226   PROTEINUR 30 (A) 09/07/2015 1226   UROBILINOGEN 0.2  06/22/2010 1226   NITRITE NEGATIVE 09/07/2015 1226   LEUKOCYTESUR LARGE (A) 09/07/2015 1226   Sepsis Labs Invalid input(s): PROCALCITONIN,  WBC,  LACTICIDVEN   Time coordinating discharge: Over 30 minutes  SIGNED:   Janece Canterbury, MD  [facility] 09/14/2015, 2:17 PM Pager   If 7PM-7AM, please contact night-coverage [email or web address] Password [email or web address]

## 2015-09-14 NOTE — Clinical Social Work Note (Signed)
CSW contacted MD regarding the need to sign DNR for pt , MD informed CSW that she spoke with pt's son and that pt son agreeable to choosing residential hospice for pt. CSW contacted pt dtr and informed of MD recommendation for residential hospice. Pt dtr agreeable to hospice and requested [facility]. CSW canceled PTAR and contacted [facility] to inform that pt will not be returning to SNF.

## 2015-09-14 NOTE — NC FL2 (Signed)
[place] LEVEL OF CARE SCREENING TOOL     IDENTIFICATION  Patient Name: Paula West Birthdate: Feb 06, 1932 Sex: female Admission Date (Current Location): 09/07/2015  [facility] and [place] Number:  Herbalist and Address:  [facility] [place], [place] [address], [place], [place] [address]      Provider Number: [id number]  Attending Physician Name and Address:  Janece Canterbury, MD  Relative Name and Phone Number:       Current Level of Care: Hospital Recommended Level of Care: [place] Prior Approval Number:    Date Approved/Denied:   PASRR Number:  ([id number])  Discharge Plan: SNF    Current Diagnoses: Patient Active Problem List   Diagnosis Date Noted  . Acute embolic stroke ([place]) A999333  . Embolic stroke involving cerebral artery ([place]) 09/11/2015  . Protein-calorie malnutrition, severe 09/10/2015  . Palliative care encounter   . DNR (do not resuscitate)   . Poor appetite   . Dysphagia   . Dehydration 09/07/2015  . UTI (lower urinary tract infection) 09/07/2015  . Metabolic encephalopathy [id number]  . Recent NSTEMI (non-ST elevated myocardial infarction-July 2017) ([place]) 09/07/2015  . Right Adrenal nodule ([place]) 09/07/2015  . Altered mental status   . AKI (acute kidney injury) ([place]) 08/22/2015  . Delirium 08/22/2015  . Esophageal reflux   . Abdominal aortic atherosclerosis ([place]) 08/02/2015  . Chronic obstructive pulmonary disease ([place])   . 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 CKD (chronic kidney disease), stage III 07/12/2015  . Hypercalcemia 07/12/2015  . Asthma   . COPD (chronic obstructive pulmonary disease) ([place])   . Hypertensive heart disease without CHF   . Hyperlipidemia   . Diabetes mellitus, controlled ([place])   . Essential hypertension     Orientation RESPIRATION BLADDER Height & Weight     Self  Normal Incontinent Weight: 157 lb (71.2 kg) Height:   5\' 4"  (162.6 cm)  BEHAVIORAL SYMPTOMS/MOOD NEUROLOGICAL BOWEL NUTRITION STATUS   (None)  (none) Continent  (DYS1)  AMBULATORY STATUS COMMUNICATION OF NEEDS Skin   Extensive Assist Does not communicate Normal                       Personal Care Assistance Level of Assistance  Feeding, Bathing, Dressing Bathing Assistance: Limited assistance Feeding assistance: Limited assistance Dressing Assistance: Limited assistance     Functional Limitations Info  Sight, Hearing, Speech Sight Info: Adequate Hearing Info: Adequate Speech Info: Impaired    SPECIAL CARE FACTORS FREQUENCY  PT (By licensed PT), OT (By licensed OT)     PT Frequency:  (5X/WEEK) OT Frequency:  (5X/WEEK)            Contractures Contractures Info: Not present    Additional Factors Info  Code Status, Allergies Code Status Info:  (DNR) Allergies Info:  (Codeine, Darvon Propoxyphene, Penicillins)           Current Medications (09/14/2015):  This is the current hospital active medication list Current Facility-Administered Medications  Medication Dose Route Frequency Provider Last Rate Last Dose  . acetaminophen (TYLENOL) tablet 650 mg  650 mg Oral Q6H PRN Samella Parr, NP       Or  . acetaminophen (TYLENOL) suppository 650 mg  650 mg Rectal Q6H PRN Samella Parr, NP      . albuterol (PROVENTIL) (2.5 MG/3ML) 0.083% nebulizer solution 3 mL  3 mL Inhalation Q6H PRN Samella Parr, NP      .  bisacodyl (DULCOLAX) suppository 10 mg  10 mg Rectal Daily PRN Janece Canterbury, MD      . dextrose 5 % and 0.45 % NaCl with KCl 20 mEq/L infusion   Intravenous Continuous Janece Canterbury, MD 75 mL/hr at 09/13/15 1734    . LORazepam (ATIVAN) tablet 0.5 mg  0.5 mg Oral Q4H PRN Janece Canterbury, MD      . prednisoLONE acetate (PRED FORTE) 1 % ophthalmic suspension 1 drop  1 drop Both Eyes QID Caren Griffins, MD   1 drop at 09/14/15 1400     Discharge Medications: Please see discharge summary for a list of  discharge medications.  Relevant Imaging Results:  Relevant Lab Results:   Additional Information SS#: [id number]  Junie Spencer, LCSW

## 2015-09-15 DIAGNOSIS — G8191 Hemiplegia, unspecified affecting right dominant side: Secondary | ICD-10-CM

## 2015-09-15 NOTE — Care Management Important Message (Signed)
Important Message  Patient Details  Name: Paula West MRN: [id number] Date of Birth: February 09, 1932   Medicare Important Message Given:  Yes    Orbie Pyo 09/15/2015, 12:14 PM

## 2015-09-15 NOTE — Clinical Social Work Note (Signed)
Patient will discharge today per MD order. Patient will discharge to [place] Transportation: PTAR once family completes paperwork; scheduled at 4:30pm today   Nonnie Done, [place] [phone]  5N1-9, 2S 15-16 and Psychiatric Service Line  Licensed Clinical Social Worker

## 2015-09-15 NOTE — Progress Notes (Signed)
Spoke with Hospice care regarding placement for Paula West, [place] meeting with family this evening around 4:30 for meeting

## 2015-09-15 NOTE — Progress Notes (Signed)
Daily Progress Note   Patient Name: Paula West       Date: 09/15/2015 DOB: 1932/08/06  Age: 82 y.o. MRN#: [id number] Attending Physician: Janece Canterbury, MD Primary Care Physician: No PCP Per Patient Admit Date: 09/07/2015  Reason for Consultation/Follow-up: Establishing goals of care  Subjective: [name] is much unchanged. I did speak further with daughter, Paula West (still unable to reach son Paula West). Discussed new stroke with Paula West and unlikelihood of improvement. This does change things for family as they may not want to pursue feeding tube. I did discuss options for more aggressive care with feeding tube vs comfort and hospice without feeding tube. I did recommend more comfort path considering poor QOL - Paula West agrees but unsure she completely understands the consequences of these decisions. Will need to confirm decisions with son Paula West and make sure he has better understanding of consequences of decisions before moving forward. Discussed with Dr. Sheran Fava.   Length of Stay: 8  Current Medications: Scheduled Meds:  . prednisoLONE acetate  1 drop Both Eyes QID    Continuous Infusions: . dextrose 5 % and 0.45 % NaCl with KCl 20 mEq/L 75 mL/hr (09/15/15 0612)    PRN Meds: acetaminophen **OR** acetaminophen, albuterol, bisacodyl, LORazepam  Physical Exam  Constitutional: She appears well-developed. She appears lethargic.  HENT:  Head: Normocephalic and atraumatic.  Cardiovascular: Normal rate.   Pulmonary/Chest: Effort normal. No accessory muscle usage. No tachypnea. No respiratory distress.  Clear moderate oral secretions  Abdominal: Soft. Normal appearance.  Neurological: She appears lethargic.            Vital Signs: BP (!) 156/80 (BP Location: Left Arm)   Pulse 75    Temp 97.8 F (36.6 C) (Oral)   Resp 16   Ht 5\' 4"  (1.626 m)   Wt 71.2 kg (157 lb)   SpO2 98%   BMI 26.95 kg/m  SpO2: SpO2: 98 % O2 Device: O2 Device: Not Delivered O2 Flow Rate:    Intake/output summary: No intake or output data in the 24 hours ending 09/15/15 0854 LBM: Last BM Date: 09/13/15 Baseline Weight: Weight: 74.8 kg (165 lb) Most recent weight: Weight: 71.2 kg (157 lb)       Palliative Assessment/Data: PPS: 10%   Flowsheet Rows   Flowsheet Row Most Recent Value  Intake Tab  Referral Department  Hospitalist  Unit at Time of Referral  Orthopedic Unit  Palliative Care Primary Diagnosis  Neurology  Date Notified  09/08/15  Palliative Care Type  New Palliative care  Reason for referral  Clarify Goals of Care  Date of Admission  09/07/15  Date first seen by Palliative Care  09/09/15  # of days Palliative referral response time  1 Day(s)  # of days IP prior to Palliative referral  1  Clinical Assessment  Psychosocial & Spiritual Assessment  Palliative Care Outcomes      Patient Active Problem List   Diagnosis Date Noted  . Acute embolic stroke ([place]) [id number]  . Embolic stroke involving cerebral artery ([place]) 09/11/2015  . Protein-calorie malnutrition, severe 09/10/2015  . Palliative care encounter   . DNR (do not resuscitate)   . Poor appetite   . Dysphagia   . Dehydration 09/07/2015  . UTI (lower urinary tract infection) 09/07/2015  . Metabolic encephalopathy [id number]  . Recent NSTEMI (non-ST elevated myocardial infarction-July 2017) ([place]) 09/07/2015  . Right Adrenal nodule ([place]) 09/07/2015  . Altered mental status   . AKI (acute West injury) ([place]) 08/22/2015  . Delirium 08/22/2015  . Esophageal reflux   . Abdominal aortic atherosclerosis ([place]) 08/02/2015  . Chronic obstructive pulmonary disease ([place])   . 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 CKD (chronic West disease), stage III 07/12/2015  . Hypercalcemia  07/12/2015  . Asthma   . COPD (chronic obstructive pulmonary disease) ([place])   . Hypertensive heart disease without CHF   . Hyperlipidemia   . Diabetes mellitus, controlled ([place])   . Essential hypertension     Palliative Care Assessment & Plan   Patient Profile: 82 y.o. female  with past medical history of recent thalamic CVA June 2017 with residual right sided weakness, DM, HLD, CKD 3, COPD, poorly communicative at baseline admitted 09/07/2015 with decreased responsiveness and lethargy. Recently d/c from hospital 08/25/15 with dehydration and acute renal failure but responded to fluids. Appetite and hydration continue to be problematic. Incidental finding of 3 cm nodule right adrenal gland with recommendations for MRI f/u once renal function improves - does not appear this has been done. MRI shows recurrent stroke.   Assessment: Lying in bed, less responsiveness today.   Recommendations/Plan:  No signs of pain or discomfort. Keep clean, dry. Turn q2h. High risk for decubitus ulcers.   Will likely need PEG for nutrition/hydration per family wishes.   Await family decision for PEG placement vs hospice care. I believe she would be eligible for hospice facility without feeding.   Code Status:    Code Status Orders        Start     Ordered   09/10/15 [id number]  Do not attempt resuscitation (DNR)  Continuous    Question Answer Comment  In the event of cardiac or respiratory ARREST Do not call a "code blue"   In the event of cardiac or respiratory ARREST Do not perform Intubation, CPR, defibrillation or ACLS   In the event of cardiac or respiratory ARREST Use medication by any route, position, wound care, and other measures to relive pain and suffering. May use oxygen, suction and manual treatment of airway obstruction as needed for comfort.      09/10/15 1538    Code Status History    Date Active Date Inactive Code Status Order ID Comments User Context   09/07/2015  3:09 PM 09/10/2015   3:38 PM Full Code [id number]  Samella Parr, NP Inpatient   08/22/2015  9:38 PM 08/25/2015  7:15 PM DNR [id number]  [name], MD Inpatient   08/02/2015  2:19 AM 08/05/2015  7:35 PM DNR [id number]  Toy Baker, MD Inpatient   07/11/2015  4:02 PM 07/13/2015 10:10 PM Full Code [id number]  Radene Gunning, NP ED       Prognosis:  To be determined - based on decisions. < 2 weeks without artificial feeding.   Discharge Planning:  To Be Determined. Without feeding tube will be eligible for hospice facility.    Thank you for allowing the Palliative Medicine Team to assist in the care of this patient.   Time In: 1110 Time Out: 1125 Total Time 45min Prolonged Time Billed  no       Greater than 50%  of this time was spent counseling and coordinating care related to the above assessment and plan.  Pershing Proud, NP  Please contact Palliative Medicine Team phone at [phone] for questions and concerns.

## 2015-09-15 NOTE — Progress Notes (Addendum)
5N-26 - [facility] - RN Referral Visit   Meeting with patient's son Collier Bullock and friend Pollie Friar at bedside to sign consents for patient admission to [facility].  Daughter Sampson Goon present via conference call.  All parties in agreement with transfer today and have no further questions at this time.  PTAR has been arranged for 5:30pm by SW.  Thank you, Moss Mc, RN, BSN, [facility] Director of Inpatient Services [phone]

## 2015-09-15 NOTE — Progress Notes (Signed)
Pt being picked up by [facility], No report give d/t hospice

## 2015-09-15 NOTE — Progress Notes (Signed)
CSW sent referral to [facility].  Paula West LCSWA [phone]

## 2015-09-15 NOTE — Progress Notes (Addendum)
Palliative Medicine RN Note: Checked on hospice referral, as there is no note from [place]. No active order on chart, as it was marked completed and dropped off the pt's order list. Spoke w Stacie w [facility], who got the referral just a few minutes ago. She will have someone contact the family asap, but there may be a delay d/t their staffing.  Marjie Skiff Mickael Mcnutt, RN, BSN, [name] 09/15/2015 9:14 AM Cell [phone] 8:00-4:00 Monday-Friday Office [phone]

## 2015-09-15 NOTE — Progress Notes (Signed)
5N-26 - [place] - RN Referral Visit  [facility] referral received this morning from [facility].  Visited briefly with patient at bedside; no family present.  Son Collier Bullock and daughter Sampson Goon reached by phone and are available to meet at 4:30pm to sign consents.  Updated patient's primary RN and SW [employer].  Please fax discharge summary to [facility] when available at [phone].    Feel free to contact me with any questions/needs.  Thank you! Moss Mc, RN, BSN, [facility] Director of Inpatient Services [phone]

## 2015-09-15 NOTE — Discharge Summary (Signed)
Physician Discharge Summary  Paula West ALP:[id number] DOB: April 08, 1932 DOA: 09/07/2015  PCP: No PCP Per Patient  Admit date: 09/07/2015 Discharge date:  09/15/2015  Admitted From:  SNF Disposition:  Residential hospice  Discharge Condition:  Fair, guarded CODE STATUS:  DNR  Diet recommendation:   Dysphagia 1 with thin liquids  Brief/Interim Summary:  Paula West an 82 y.o.femalewho suffered an acute left thalamic infarct in late June.  She was hospitalized a few weeks later for chest pain and elevated troponin at which time plavix was added.  She was rehospitalized in early August for changes in speech and increased lethargy at which time she was found to be dehydrated with AKI with a creatinine of 4.  She was rehydrated and discharged to home.  She met with Dr. Jaynee Eagles, Neurology on 8/15, who noted that she had not had any improvement in her stroke symptoms and in fact had increasing confusion, decreased oral intake, and he was concerned that she may need a feeding tube.  She was admitted 09/07/15 from SNF due to decreased responsiveness and lethargy.  She had recently been started on antibiotics at SNF for a UTI. Upon initial presentation, the patient was found to have acute kidney injury with a creatinine of 2.  She was treated with antibiotics for UTI and IVF.  Despite rehydration and treatment of her UTI, she continued to have profound right sided weakness, inability to speak or communicate, and dysphagia.  She was offered a dysphagia 1 diet but refused to eat and drink.  MRI of the brain was obtained because of her poor response to antibiotics and IVF and it demonstrated acute and subacute embolic strokes.  Neurology was consulted and at this time did not recommend further work up unless the family wanted to pursue Holter monitor.  The possibility of PEG tube was offered.  Palliative care was consulted and met with the family. After prolonged discussion, she was transitioned to DO NOT  RESUSCITATE status and the family decided to pursue hospice care.    Discharge Diagnoses:  Principal Problem:   Acute embolic stroke [facility]) Active Problems:   COPD (chronic obstructive pulmonary disease) ([place])   Hyperlipidemia   Diabetes mellitus, controlled ([place])   Essential hypertension   Thalamic infarction (June 2017 w/ residual dysarthria/FTT)   CKD (chronic kidney disease), stage III   AKI (acute kidney injury) ([place])   Dehydration   UTI (lower urinary tract infection)   Metabolic encephalopathy   Recent NSTEMI (non-ST elevated myocardial infarction-July 2017) ([place])   Right Adrenal nodule (H[facility])   Altered mental status   Protein-calorie malnutrition, severe   Palliative care encounter   DNR (do not resuscitate)   Poor appetite   Dysphagia   Embolic stroke involving cerebral artery (H[facility])   Metabolic encephalopathy due to UTI, dehydration, and acute/subacute embolic strokes superimposed on recent thalamic infarct in June.  She was treated with IVF and antibiotics.  Family elected comfort measures as we do not expect significant improvement from her current state.  She is not eating or drinking anything and therefore has a limited prognosis of days to a few weeks.  Awaiting residential hospice placement.  Dysphagia with severe protein calorie malnutrition.  Met with speech therapy.  Dysphagia 1 with thin liquids for comfort > patient refusing  AKI (acute kidney injury) secondary to dehydration, in the setting of stage III CKD, resolved.  Hypokalemia, resolved with potassium in IVF.  E. coliUTI (lower urinary tract infection), treated with three days of ciprofloxacin  and had received antibiotics at SNF prior to admission, resolved.  COPD (chronic obstructive pulmonary disease), stable, continue bronchodilators as needed.  Diabetes mellitus, controlled.  A1c was 8.4 on 7/15.  Tradjenta was held.  Given SSI but requiring very little insulin so CBG and insulin were  discontinued for comfort.    Recent NSTEMI (non-ST elevated myocardial infarction-July 2017), comfort measures  Essential hypertension, current blood pressures are elevated.  Comfort measures  Right Adrenal nodule, 3cm with indeterminate Hounsfield unit measurements and poorly defined margins.  No further work up.    Enlarged left ovary, No further work up  Discharge Instructions  Discharge Instructions    Call MD for:  difficulty breathing, headache or visual disturbances    Complete by:  As directed   Call MD for:  hives    Complete by:  As directed   Call MD for:  persistant nausea and vomiting    Complete by:  As directed   Call MD for:  severe uncontrolled pain    Complete by:  As directed   Call MD for:  temperature >100.4    Complete by:  As directed   Increase activity slowly    Complete by:  As directed       Medication List    STOP taking these medications   aspirin 81 MG tablet   atorvastatin 40 MG tablet Commonly known as:  LIPITOR   carvedilol 12.5 MG tablet Commonly known as:  COREG   CEFTRIAXONE SODIUM IV   clopidogrel 75 MG tablet Commonly known as:  PLAVIX   COLCRYS 0.6 MG tablet Generic drug:  colchicine   gabapentin 300 MG capsule Commonly known as:  NEURONTIN   losartan-hydrochlorothiazide 100-25 MG tablet Commonly known as:  HYZAAR   omeprazole 20 MG capsule Commonly known as:  PRILOSEC   PROAIR HFA 108 (90 Base) MCG/ACT inhaler Generic drug:  albuterol   ranitidine 300 MG tablet Commonly known as:  ZANTAC   sucralfate 1 GM/10ML suspension Commonly known as:  CARAFATE   TRADJENTA 5 MG Tabs tablet Generic drug:  linagliptin   traMADol 50 MG tablet Commonly known as:  ULTRAM     TAKE these medications   bisacodyl 10 MG suppository Commonly known as:  DULCOLAX Place 1 suppository (10 mg total) rectally daily as needed for mild constipation.   prednisoLONE acetate 1 % ophthalmic suspension Commonly known as:  PRED  FORTE Place 1 drop into both eyes 4 (four) times daily.       Allergies  Allergen Reactions  . Codeine Rash  . Darvon [Propoxyphene] Rash  . Penicillins Rash    Has patient had a PCN reaction causing immediate rash, facial/tongue/throat swelling, SOB or lightheadedness with hypotension: Yes Has patient had a PCN reaction causing severe rash involving mucus membranes or skin necrosis: No Has patient had a PCN reaction that required hospitalization No Has patient had a PCN reaction occurring within the last 10 years: No If all of the above answers are "NO", then may proceed with Cephalosporin use.     Consultants:   Palliative care  Neurology   Procedures/Studies: Dg Chest 2 View  Result Date: 08/22/2015 CLINICAL DATA:  Altered mental status EXAM: CHEST  2 VIEW COMPARISON:  08/01/2015 FINDINGS: Cardiac shadow is not enlarged and accentuated by AP technique. No infiltrate or sizable effusion is seen. No acute bony abnormality is noted. Degenerative changes of the thoracic spine are seen. IMPRESSION: No active cardiopulmonary disease. Electronically Signed   By: Elta Guadeloupe  Lukens M.D.   On: 08/22/2015 15:14   Ct Head Wo Contrast  Result Date: 08/22/2015 CLINICAL DATA:  82 year old brought in for altered mental status and unresponsiveness. EXAM: CT HEAD WITHOUT CONTRAST TECHNIQUE: Contiguous axial images were obtained from the base of the skull through the vertex without intravenous contrast. COMPARISON:  07/11/2015 FINDINGS: There is moderate chronic ventriculomegaly which appears similar to previous exam. As described previously this remains outer for portion to the degree of sulcal enlargement. Patchy areas of low attenuation throughout the subcortical and periventricular white matter are identified compatible with chronic microvascular disease. Chronic left basal ganglia lacunar infarct is noted. No evidence for acute brain infarct. No intracranial hemorrhage or mass identified. The  paranasal sinuses and mastoid air cells appear clear. The calvarium is intact. IMPRESSION: 1. No acute intracranial abnormalities. 2. Chronic, moderate ventriculomegaly 3. Chronic small vessel ischemic change. Electronically Signed   By: Kerby Moors M.D.   On: 08/22/2015 15:10   Mr Brain Wo Contrast  Result Date: 09/10/2015 CLINICAL DATA:  82 y/o F; patient presenting with decreased responsiveness and lethargy being treated for urinary tract infection. History of recent cerebrovascular accident, diabetes, chronic kidney disease, and COPD. EXAM: MRI HEAD WITHOUT CONTRAST TECHNIQUE: Multiplanar, multiecho pulse sequences of the brain and surrounding structures were obtained without intravenous contrast. COMPARISON:  CT head 08/22/2015 and MR head 07/11/2015 FINDINGS: Brain: Small area of diffusion restriction in the left posterior and lateral frontal low consistent with acute/ early subacute infarct. There is associated mild T2 FLAIR hyperintensity. There are several additional punctate infarcts scattered throughout the left frontal and parietal lobes, within the left thalamus, left occipital lobe, right caudate head, right anterior frontal periventricular white matter, right posterior parietal lobe, right occipital lobe cortex, and within the left greater than right cerebellar hemispheres. The susceptibility weighted sequence is severely motion degraded, there is no large gross focus of susceptibility hypointensity to suggest hemorrhage. There is extensive confluent T2 FLAIR hyperintense signal abnormality in supratentorial white matter compatible with advanced chronic microvascular ischemic changes. There is moderate parenchymal volume loss. Extra-axial space: Ventriculomegaly appears disproportional to volume loss probably representing idiopathic normal pressure hydrocephalus. No midline shift. No effacement of basilar cisterns. No extra-axial collection is identified. Proximal intracranial flow voids are  maintained. No abnormality of the cervical medullary junction. Other: No abnormal signal of the paranasal sinuses. No abnormal signal of the mastoid air cells. Bilateral intra-ocular lens replacement. Calvarium is unremarkable. IMPRESSION: 1. Multiple areas of acute/early subacute infarct in multiple vascular territories with the largest lesion in the left posterior frontal lobe involving the precentral gyrus. No associated hemorrhage is identified. This is probably related to embolic disease given multiple vascular territories. 2. Stable ventriculomegaly that appears disproportional to volume loss, possibly representing idiopathic normal pressure hydrocephalus. 3. Advanced chronic microvascular ischemic changes and moderate brain parenchymal volume loss. These results will be called to the ordering clinician or representative by the Radiologist Assistant, and communication documented in the PACS or zVision Dashboard. Electronically Signed   By: Kristine Garbe M.D.   On: 09/10/2015 23:39   US Renal  Result Date: 08/23/2015 CLINICAL DATA:  82 year old female with a history of acute renal failure EXAM: RENAL / URINARY TRACT ULTRASOUND COMPLETE COMPARISON:  CT 08/02/2015 FINDINGS: Right Kidney: Length: 9.4 cm. Echogenicity similar to that of the adjacent liver. No hydronephrosis. No large echogenic focus of the right kidney. Left Kidney: Length: 9.7 cm. Echogenicity of the left kidney similar to that of the adjacent spleen parenchyma and  of the contralateral kidney. No hydronephrosis. No large echogenic focus. Bladder: Appears normal for degree of bladder distention. IMPRESSION: No evidence of hydronephrosis. Signed, Dulcy Fanny. Earleen Newport, DO [facility] Radiology Electronically Signed   By: Corrie Mckusick D.O.   On: 08/23/2015 10:58   Dg Chest Portable 1 View  Result Date: 09/07/2015 CLINICAL DATA:  Altered mental status EXAM: PORTABLE CHEST 1 VIEW COMPARISON:   August 22, 2015 FINDINGS: There is no edema or consolidation. Heart is mildly enlarged with pulmonary vascularity within normal limits. No adenopathy. No bone lesions. IMPRESSION: Mild cardiac enlargement.  No edema or consolidation. Electronically Signed   By: Lowella Grip III M.D.   On: 09/07/2015 13:14    Subjective: Sleeping today.  Nonverbal.  Discharge Exam: Vitals:   09/15/15 1500 09/15/15 1504  BP: (!) 167/102 (!) 173/81  Pulse: 71   Resp:    Temp: 98.6 F (37 C)    Vitals:   09/14/15 2209 09/15/15 0619 09/15/15 1500 09/15/15 1504  BP: (!) 144/67 (!) 156/80 (!) 167/102 (!) 173/81  Pulse: 82 75 71   Resp: 16 16    Temp: 97.9 F (36.6 C) 97.8 F (36.6 C) 98.6 F (37 C)   TempSrc: Axillary Oral Oral   SpO2:  98% 96%   Weight:      Height:       General exam:  Adult female, tearful today, reaching out with left hand HEENT:  NCAT, MMM Respiratory system: Clear to auscultation bilaterally Cardiovascular system: Regular rate and rhythm, normal S1/S2. No murmurs, rubs, gallops or clicks.  Warm extremities Gastrointestinal system: Normal active bowel sounds, soft, nondistended, nontender. MSK:  Decreased tone and bulk, no lower extremity edema Neuro:  Mild right facial droop.  Ignores right hand and right leg.  Flaccid right arm.  2 out of 5 strength of the right lower extremity.  Moves left arm and left leg spontaneously.       The results of significant diagnostics from this hospitalization (including imaging, microbiology, ancillary and laboratory) are listed below for reference.     Microbiology: Recent Results (from the past 240 hour(s))  Urine culture     Status: Abnormal   Collection Time: 09/07/15 12:26 PM  Result Value Ref Range Status   Specimen Description URINE, CATHETERIZED  Final   Special Requests NONE  Final   Culture 50,000 COLONIES/mL ESCHERICHIA COLI (A)  Final   Report Status 09/09/2015 FINAL  Final   Organism ID, Bacteria ESCHERICHIA COLI  (A)  Final      Susceptibility   Escherichia coli - MIC*    AMPICILLIN <=2 SENSITIVE Sensitive     CEFAZOLIN <=4 SENSITIVE Sensitive     CEFTRIAXONE <=1 SENSITIVE Sensitive     CIPROFLOXACIN <=0.25 SENSITIVE Sensitive     GENTAMICIN <=1 SENSITIVE Sensitive     IMIPENEM <=0.25 SENSITIVE Sensitive     NITROFURANTOIN <=16 SENSITIVE Sensitive     TRIMETH/SULFA <=20 SENSITIVE Sensitive     AMPICILLIN/SULBACTAM <=2 SENSITIVE Sensitive     PIP/TAZO <=4 SENSITIVE Sensitive     Extended ESBL NEGATIVE Sensitive     * 50,000 COLONIES/mL ESCHERICHIA COLI  Culture, blood (Routine x 2)     Status: None   Collection Time: 09/07/15  1:20 PM  Result Value Ref Range Status   Specimen Description BLOOD RIGHT FOREARM  Final   Special Requests IN PEDIATRIC BOTTLE  3CC  Final   Culture NO GROWTH 5 DAYS  Final  Report Status 09/12/2015 FINAL  Final  Culture, blood (Routine x 2)     Status: None   Collection Time: 09/07/15  1:38 PM  Result Value Ref Range Status   Specimen Description BLOOD LEFT HAND  Final   Special Requests IN PEDIATRIC BOTTLE  1CC  Final   Culture NO GROWTH 5 DAYS  Final   Report Status 09/12/2015 FINAL  Final  MRSA PCR Screening     Status: None   Collection Time: 09/07/15  6:12 PM  Result Value Ref Range Status   MRSA by PCR NEGATIVE NEGATIVE Final    Comment:        The GeneXpert MRSA Assay (FDA approved for NASAL specimens only), is one component of a comprehensive MRSA colonization surveillance program. It is not intended to diagnose MRSA infection nor to guide or monitor treatment for MRSA infections.      Labs: BNP (last 3 results) No results for input(s): BNP in the last 8760 hours. Basic Metabolic Panel:  Recent Labs Lab 09/09/15 0442 09/10/15 0517 09/11/15 0811 09/12/15 0635 09/13/15 0613  NA 142 140 141 141 132*  K 3.3* 3.1* 3.6 4.5 4.7  CL 114* 111 109 112* 101  CO2 18* 18* 19* 20* 23  GLUCOSE 113* 129* 142* 145* 181*  BUN 26* 20 19 14 11    CREATININE 1.30* 1.28* 1.40* 1.21* 1.16*  CALCIUM 8.6* 8.9 8.9 8.7* 8.2*   Liver Function Tests: No results for input(s): AST, ALT, ALKPHOS, BILITOT, PROT, ALBUMIN in the last 168 hours. No results for input(s): LIPASE, AMYLASE in the last 168 hours.  Recent Labs Lab 09/10/15 0409  AMMONIA 25   CBC:  Recent Labs Lab 09/10/15 0517 09/11/15 0811 09/12/15 0635 09/13/15 0613  WBC 12.2* 10.3 10.9* 10.6*  HGB 12.5 14.8 12.6 12.2  HCT 38.5 44.6 38.4 37.7  MCV 79.4 78.9 80.5 79.5  PLT 170 126* 148* 179   Cardiac Enzymes: No results for input(s): CKTOTAL, CKMB, CKMBINDEX, TROPONINI in the last 168 hours. BNP: Invalid input(s): POCBNP CBG:  Recent Labs Lab 09/14/15 0042 09/14/15 0454 09/14/15 0759 09/14/15 1123 09/14/15 1631  GLUCAP 164* 153* 135* 144* 172*   D-Dimer No results for input(s): DDIMER in the last 72 hours. Hgb A1c No results for input(s): HGBA1C in the last 72 hours. Lipid Profile No results for input(s): CHOL, HDL, LDLCALC, TRIG, CHOLHDL, LDLDIRECT in the last 72 hours. Thyroid function studies No results for input(s): TSH, T4TOTAL, T3FREE, THYROIDAB in the last 72 hours.  Invalid input(s): FREET3 Anemia work up No results for input(s): VITAMINB12, FOLATE, FERRITIN, TIBC, IRON, RETICCTPCT in the last 72 hours. Urinalysis    Component Value Date/Time   COLORURINE YELLOW 09/07/2015 1226   APPEARANCEUR TURBID (A) 09/07/2015 1226   LABSPEC 1.020 09/07/2015 1226   PHURINE 5.0 09/07/2015 1226   GLUCOSEU 100 (A) 09/07/2015 1226   HGBUR LARGE (A) 09/07/2015 1226   BILIRUBINUR NEGATIVE 09/07/2015 1226   KETONESUR NEGATIVE 09/07/2015 1226   PROTEINUR 30 (A) 09/07/2015 1226   UROBILINOGEN 0.2 06/22/2010 1226   NITRITE NEGATIVE 09/07/2015 1226   LEUKOCYTESUR LARGE (A) 09/07/2015 1226   Sepsis Labs Invalid input(s): PROCALCITONIN,  WBC,  LACTICIDVEN   Time coordinating discharge: Over 30 minutes  SIGNED:   Janece Canterbury, MD  [facility] 09/15/2015, 3:15 PM Pager   If 7PM-7AM, please contact night-coverage [email or web address] Password [email or web address]

## 2015-09-17 LAB — HEMOGLOBIN A1C
Hgb A1c MFr Bld: 7.7 % — ABNORMAL HIGH (ref 4.8–5.6)
MEAN PLASMA GLUCOSE: 174 mg/dL

## 2015-09-25 ENCOUNTER — Ambulatory Visit: Payer: Self-pay

## 2015-10-19 DEATH — deceased

## 2016-03-04 ENCOUNTER — Ambulatory Visit: Payer: Self-pay | Admitting: Adult Health

## 2017-01-18 IMAGING — CR DG CHEST 2V
2 series · 2 of 2 positions shown · non-contrast
Comparison: 08/01/2015

CLINICAL DATA: Altered mental status

EXAM:
CHEST  2 VIEW

[chest lat]
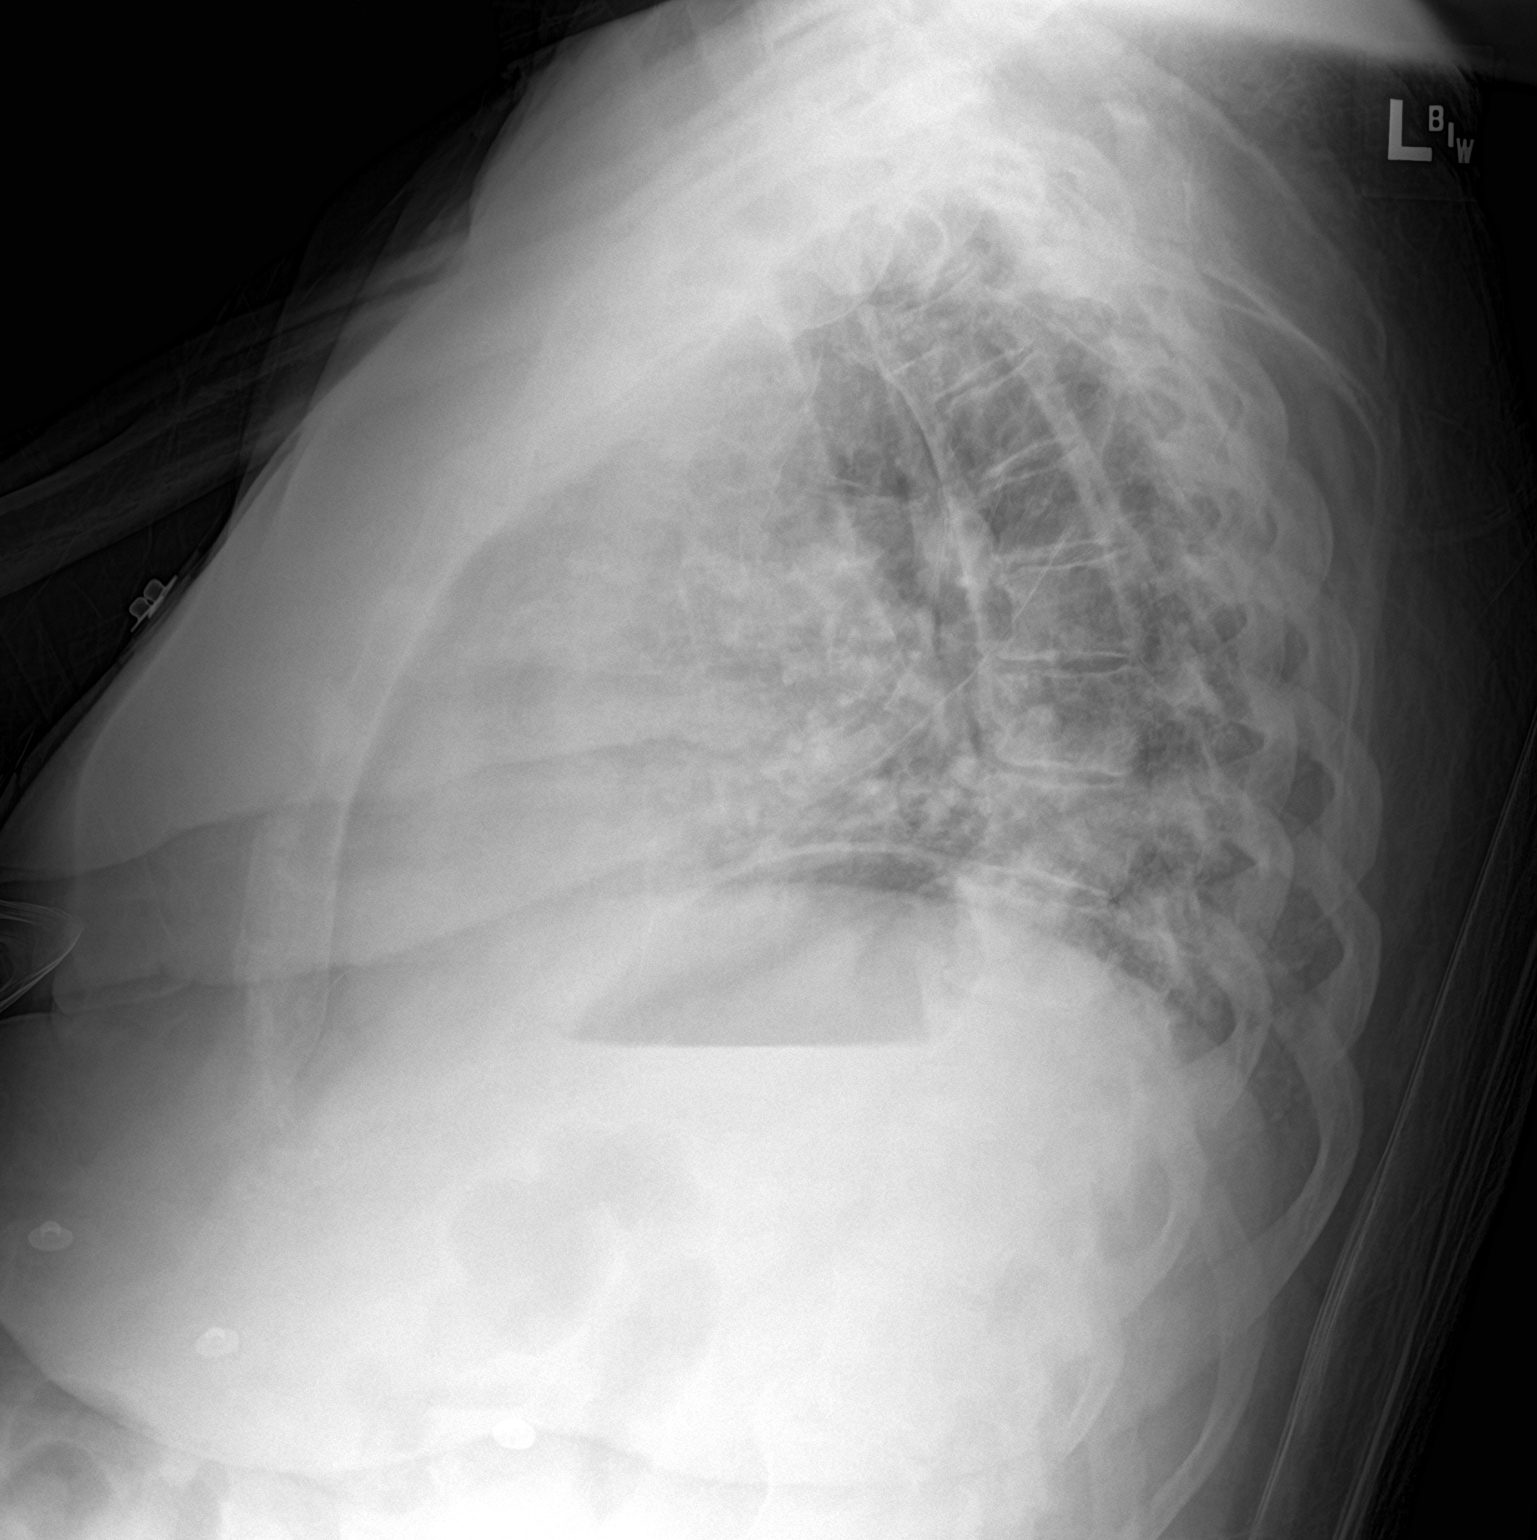

[chest ap]
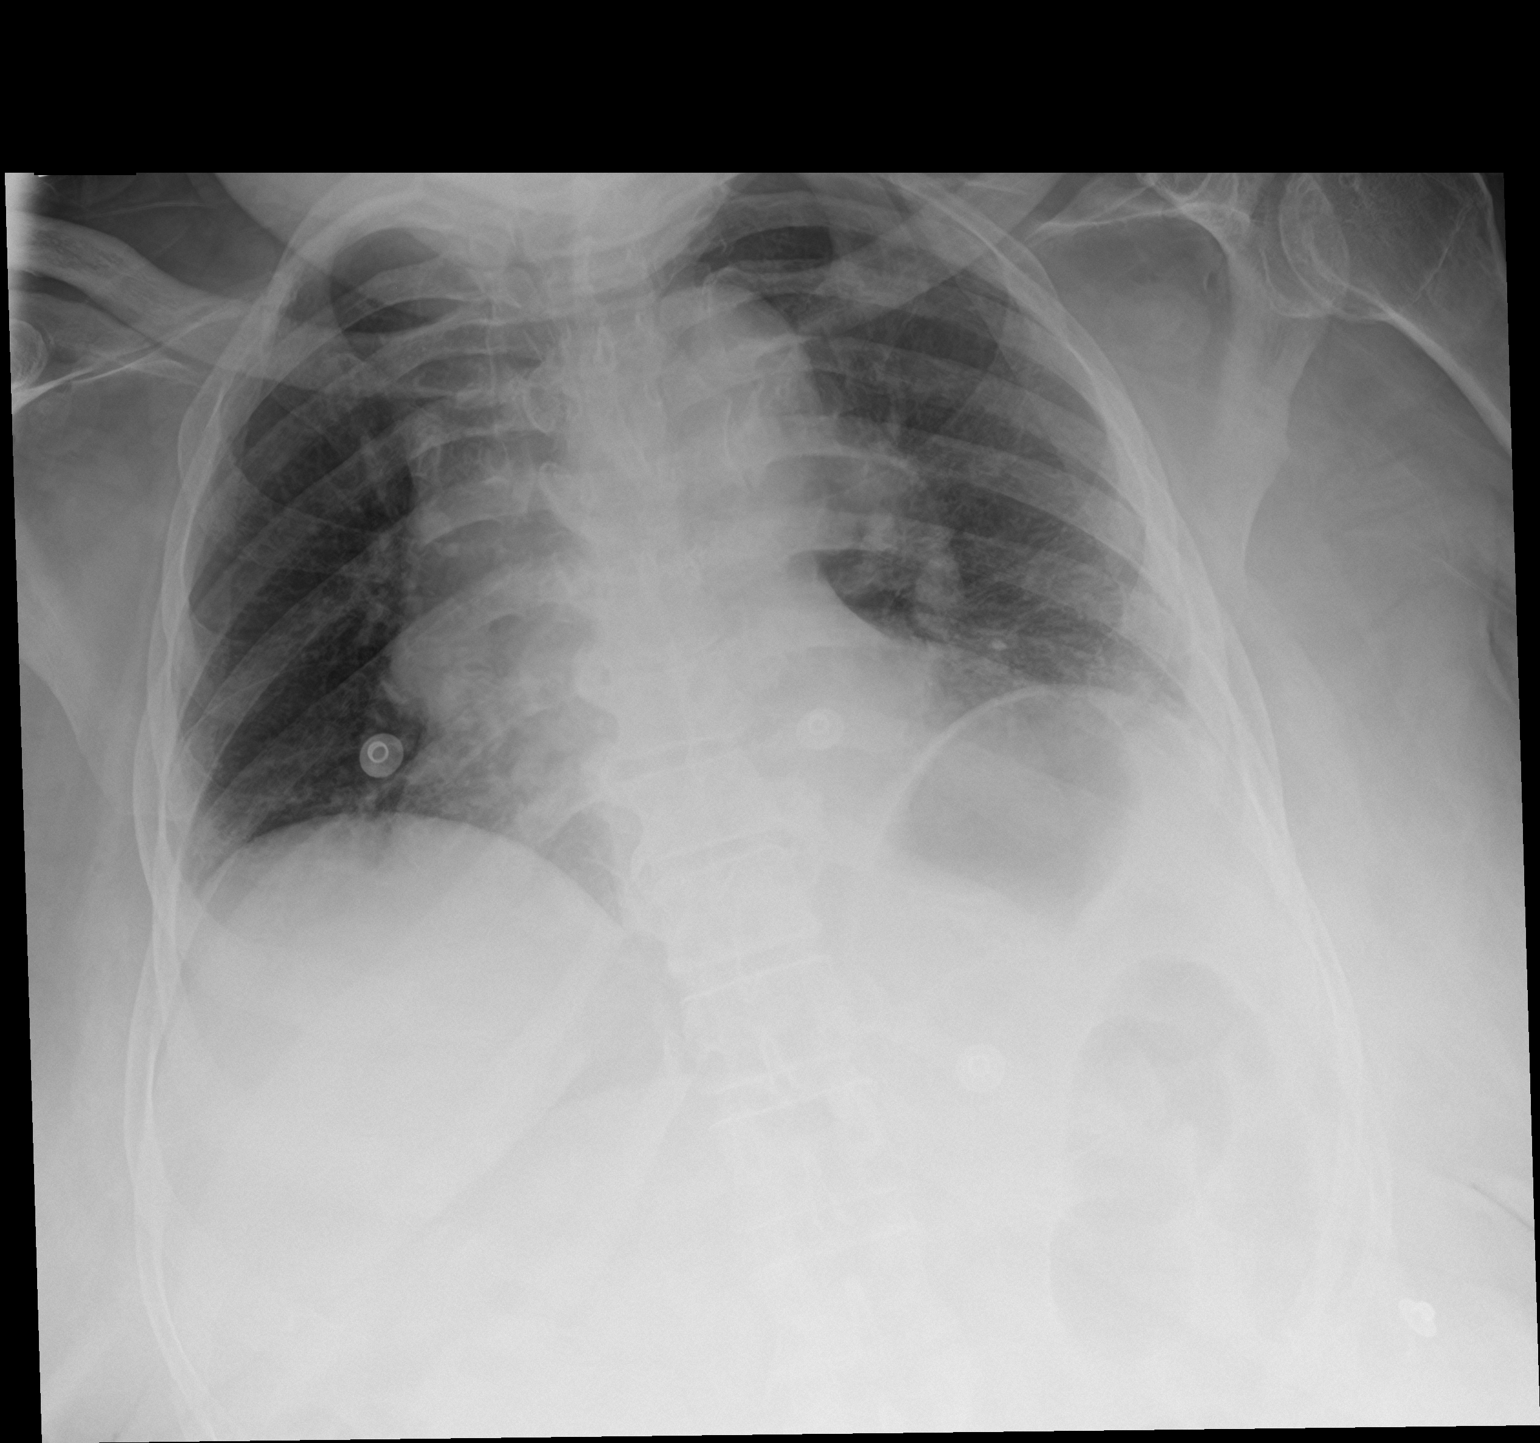

[2 of 2 positions shown; findings below may reference images not displayed]

FINDINGS: Cardiac shadow is not enlarged and accentuated by AP technique. No
infiltrate or sizable effusion is seen. No acute bony abnormality is
noted. Degenerative changes of the thoracic spine are seen.
IMPRESSION: No active cardiopulmonary disease.

## 2017-01-18 IMAGING — CT CT HEAD W/O CM
4 series · 16 of 47 positions shown, 18 images · non-contrast
Comparison: 07/11/2015

CLINICAL DATA: 82-year-old brought in for altered mental status and
unresponsiveness.

EXAM:
CT HEAD WITHOUT CONTRAST
TECHNIQUE: Contiguous axial images were obtained from the base of the skull
through the vertex without intravenous contrast.

[Series 2: head without · axial · non-contrast · 0.46mm/px · z∈[-140,-15]mm · 7 of 35 slices shown, 9 images]
[im 5/35  brain]
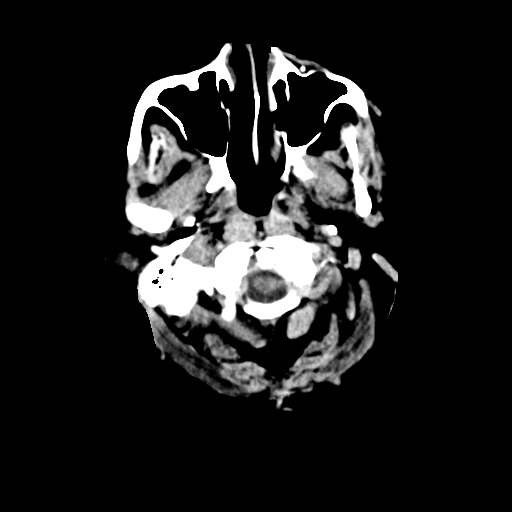
[im 5/35  bone]
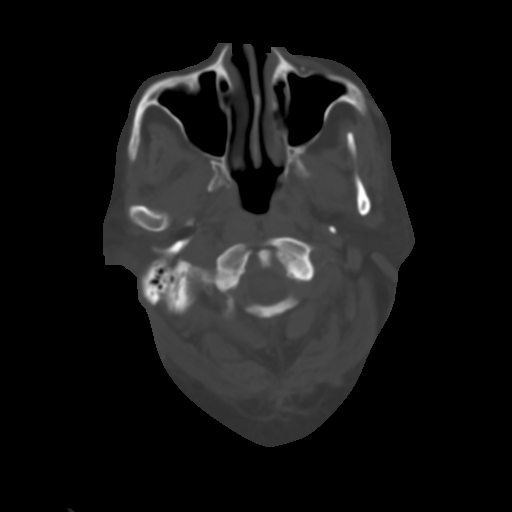
[im 9/35  brain]
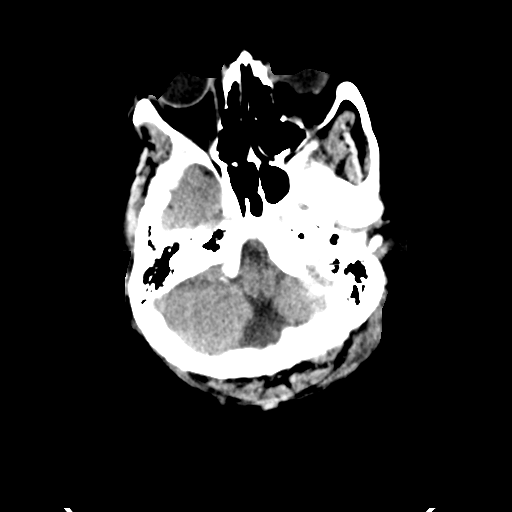
[im 13/35  brain]
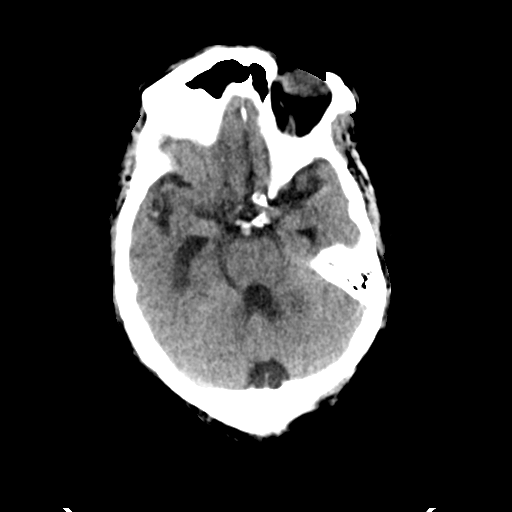
[im 18/35  brain]
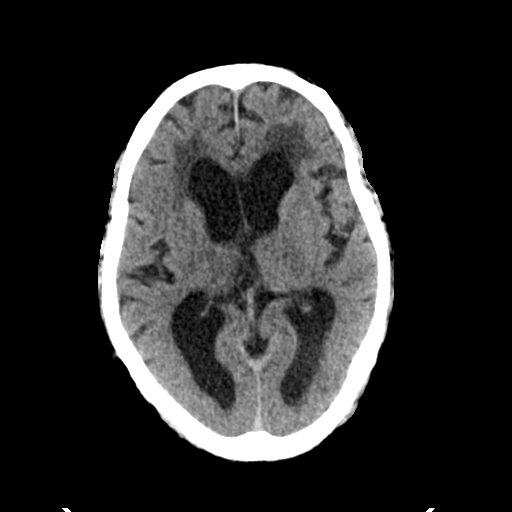
[im 22/35  brain]
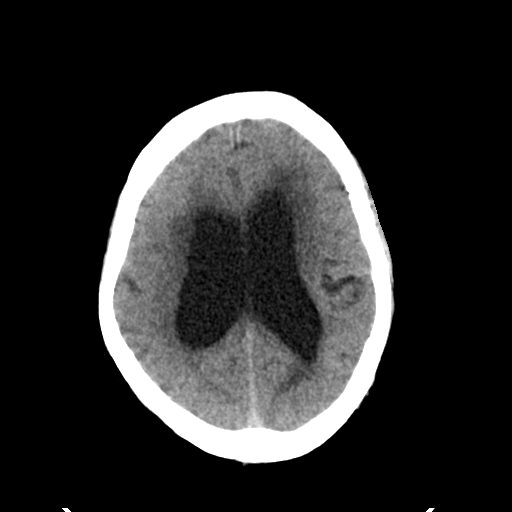
[im 22/35  bone]
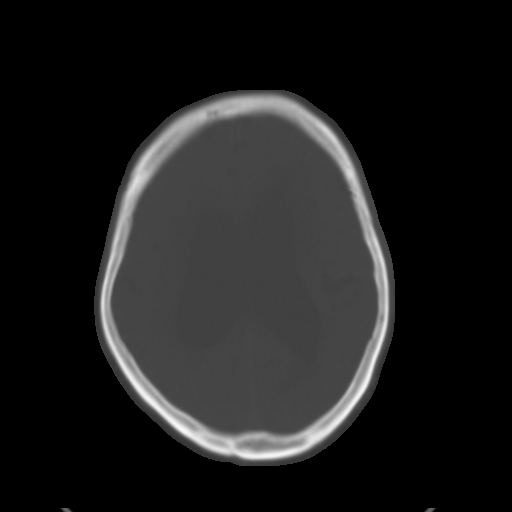
[im 26/35  brain]
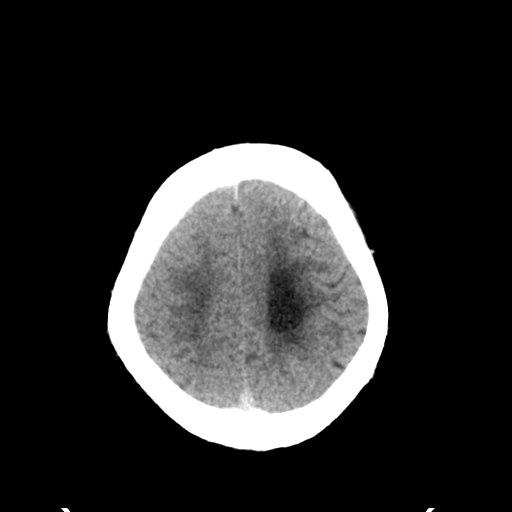
[im 30/35  brain]
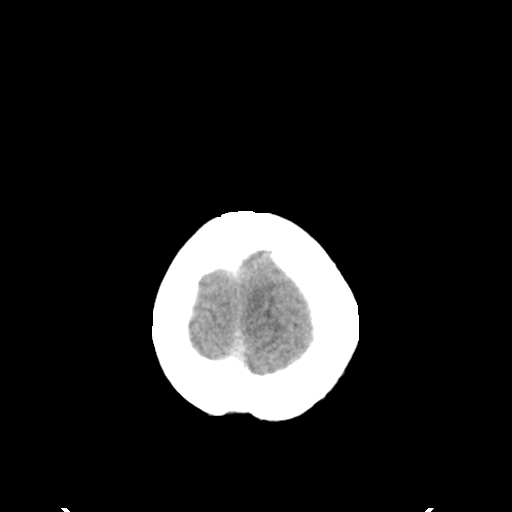

[Series 3: head bone · axial · 0.46mm/px · z∈[-144,-110]mm · 3 of 86 slices shown]
[im 9/86  bone]
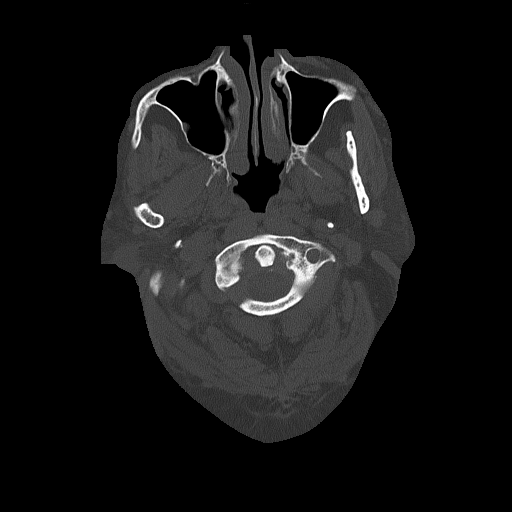
[im 18/86  bone]
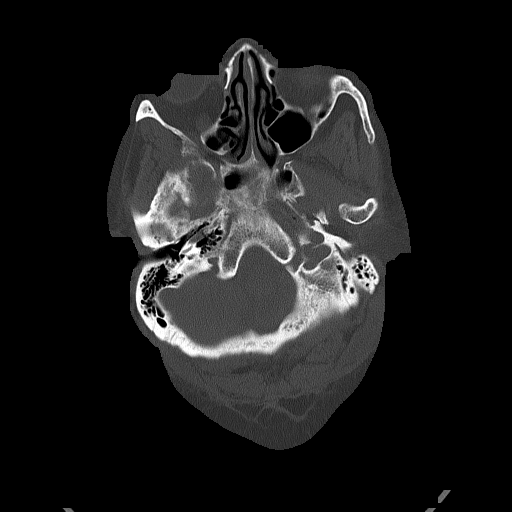
[im 26/86  bone]
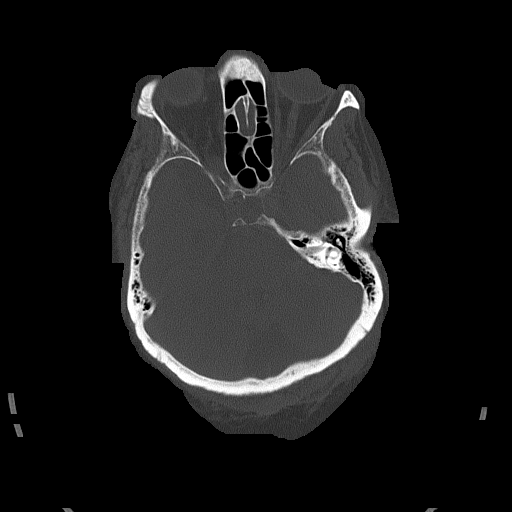

[Series 4: head without cor · coronal · non-contrast · 0.33mm/px · 3 of 71 slices shown]
[im 24/71  brain]
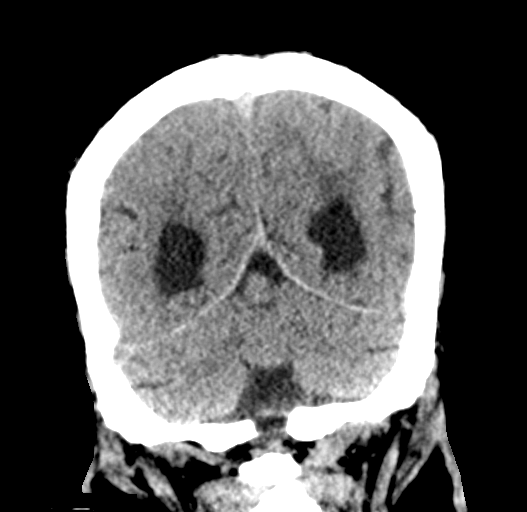
[im 32/71  brain]
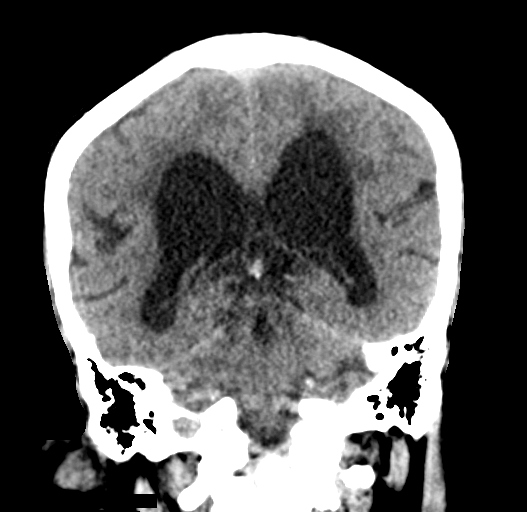
[im 39/71  brain]
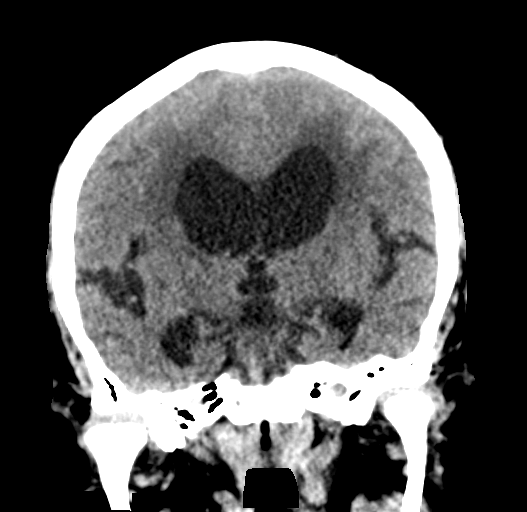

[Series 5: head without sag · sagittal · non-contrast · 0.31mm/px · 3 of 61 slices shown]
[im 21/61  brain]
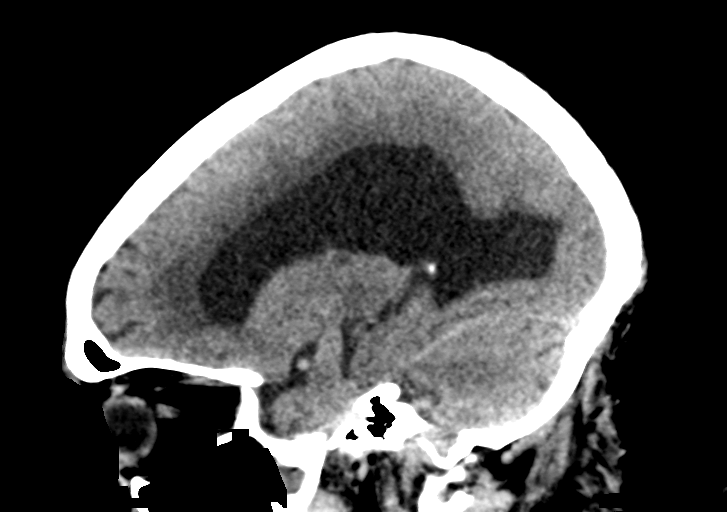
[im 31/61  brain]
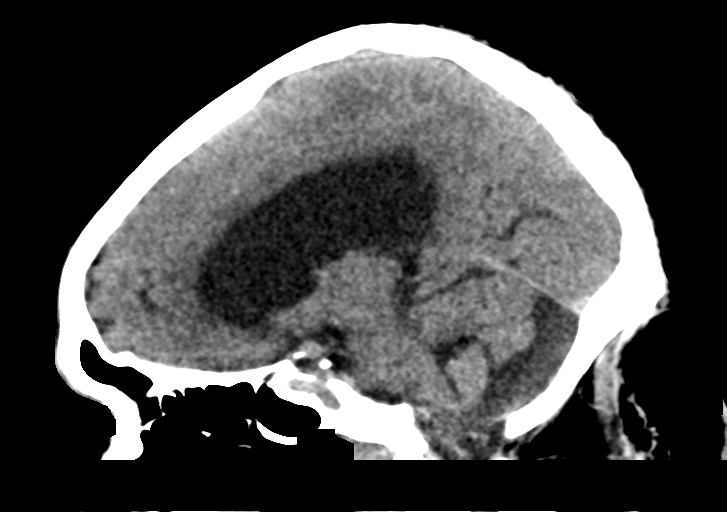
[im 41/61  brain]
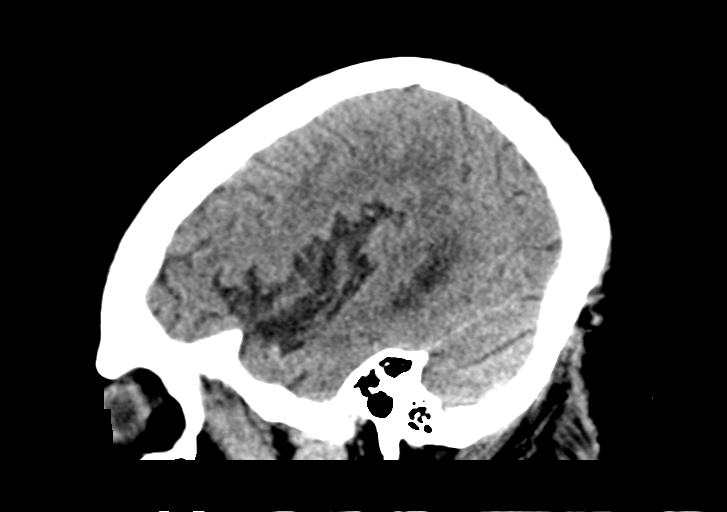

[16 of 47 positions shown; findings below may reference images not displayed]

FINDINGS: There is moderate chronic ventriculomegaly which appears similar to
previous exam. As described previously this remains outer for
portion to the degree of sulcal enlargement. Patchy areas of low
attenuation throughout the subcortical and periventricular white
matter are identified compatible with chronic microvascular disease.
Chronic left basal ganglia lacunar infarct is noted. No evidence for
acute brain infarct. No intracranial hemorrhage or mass identified.
The paranasal sinuses and mastoid air cells appear clear. The
calvarium is intact.
IMPRESSION: 1. No acute intracranial abnormalities.
2. Chronic, moderate ventriculomegaly
3. Chronic small vessel ischemic change.

## 2018-01-11 IMAGING — US US RENAL
1 series · 14 of 25 positions shown · non-contrast
Comparison: CT 08/02/2015

CLINICAL DATA: 82-year-old female with a history of acute renal
failure

EXAM:
RENAL / URINARY TRACT ULTRASOUND COMPLETE

[Series 1: us renal · 0.22mm/px · 14 of 37 slices shown]
[im 1/37]
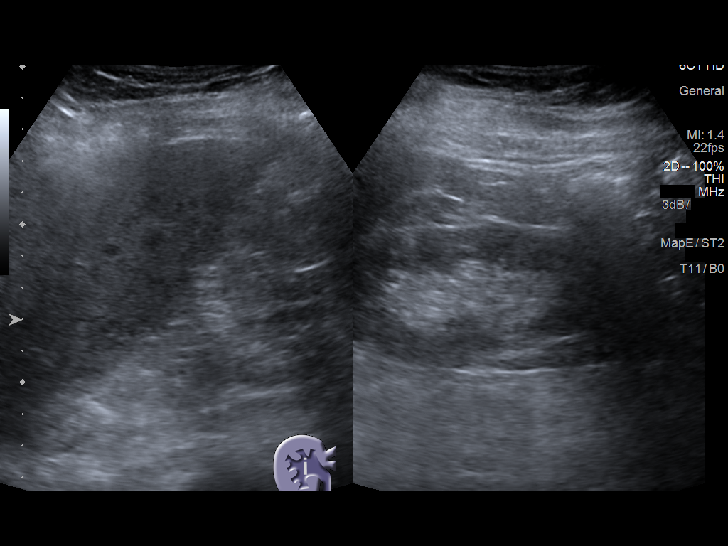
[im 4/37]
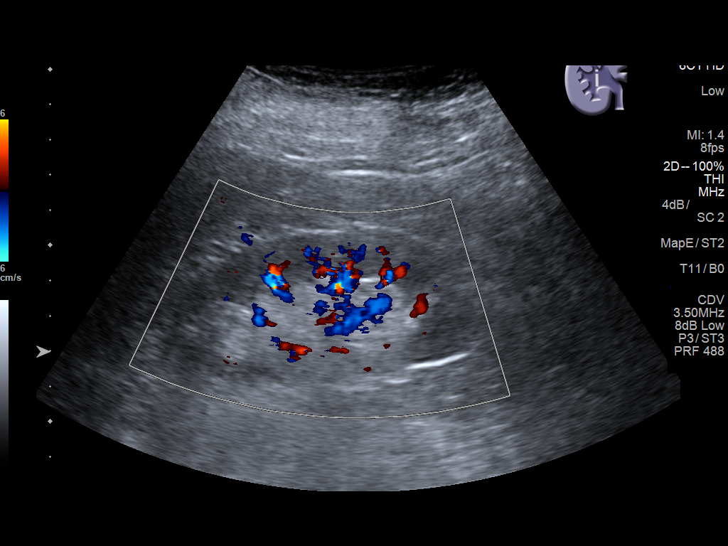
[im 7/37]
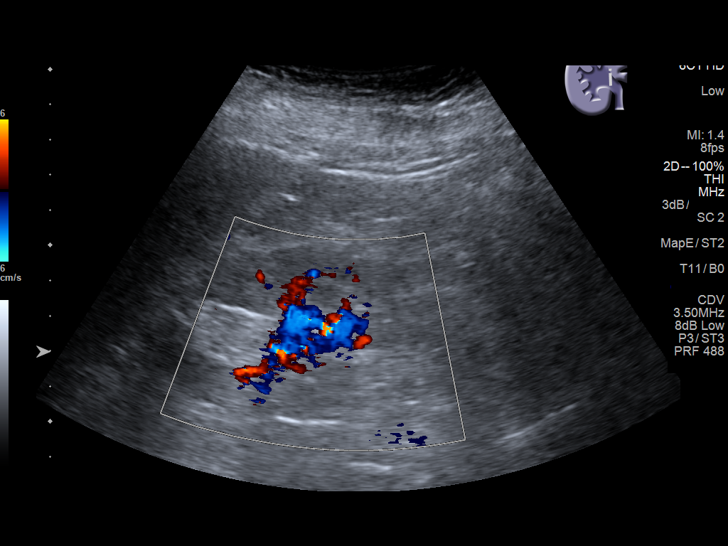
[im 10/37]
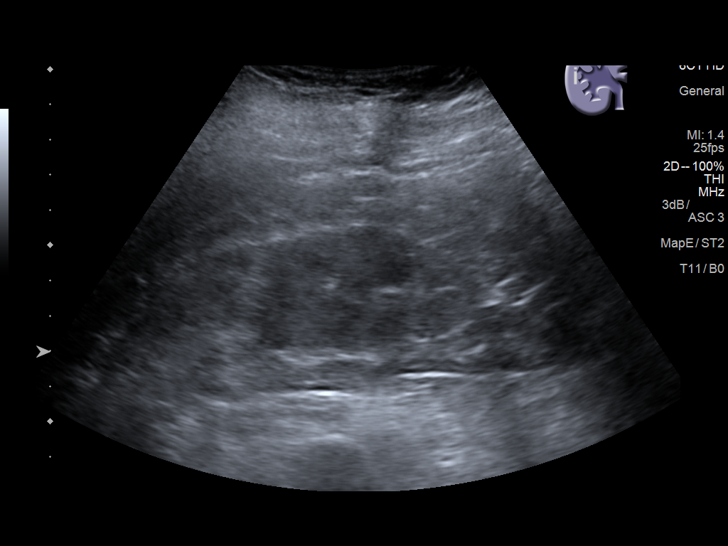
[im 13/37]
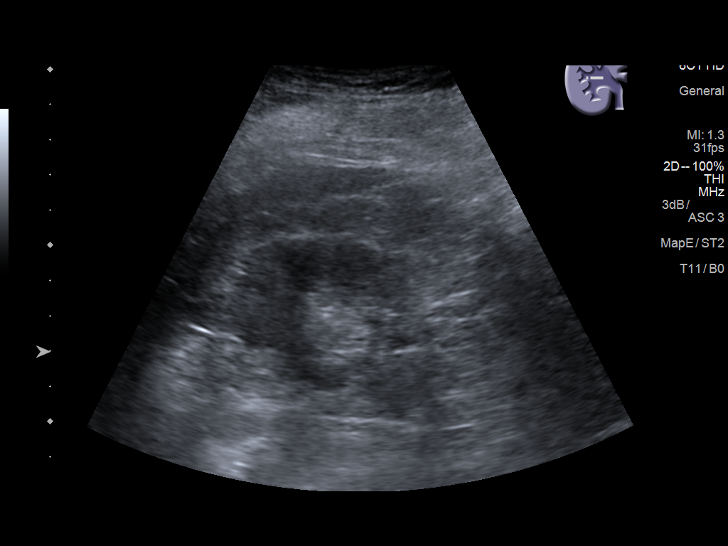
[im 14/37]
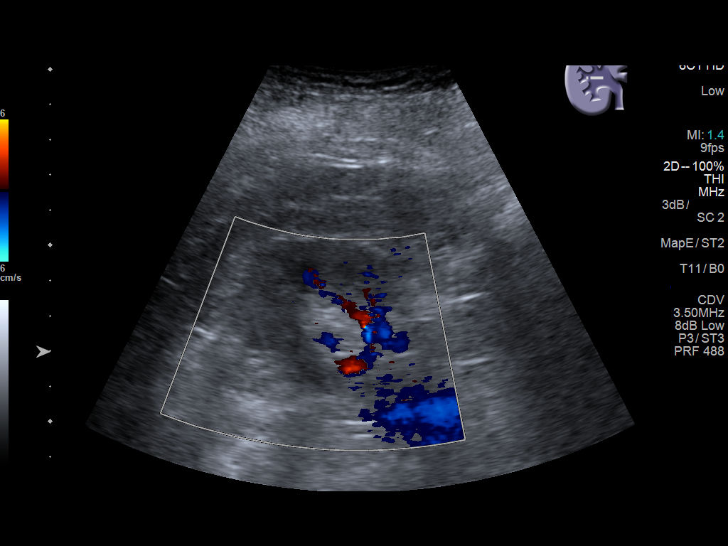
[im 17/37]
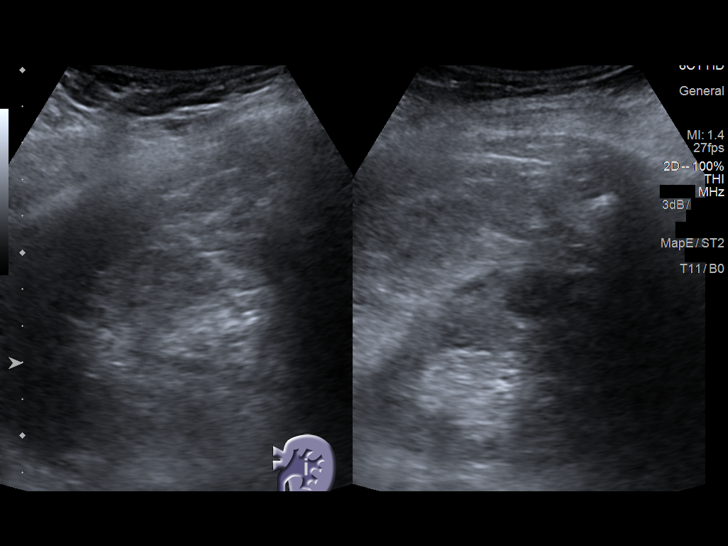
[im 20/37]
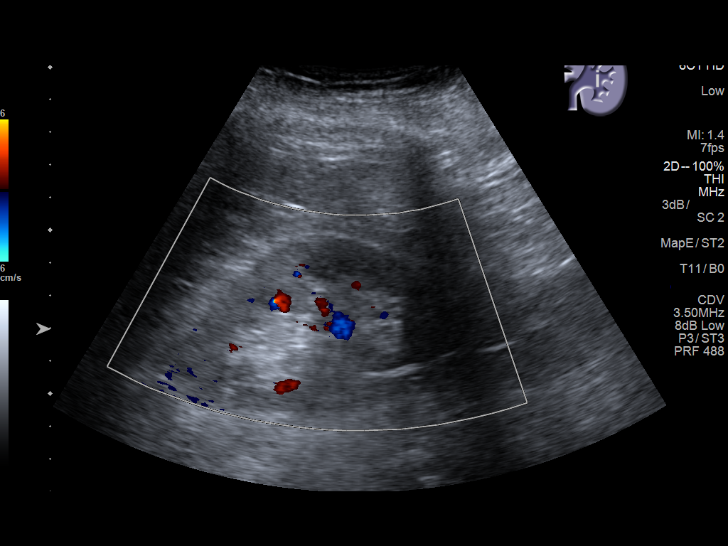
[im 23/37]
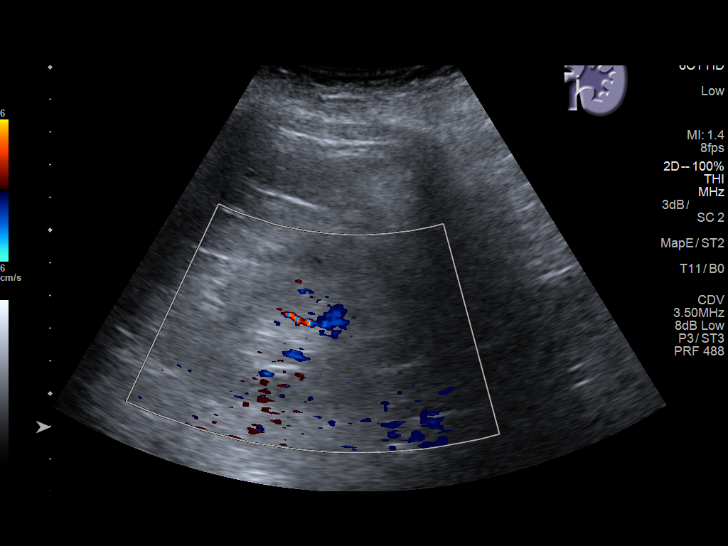
[im 25/37]
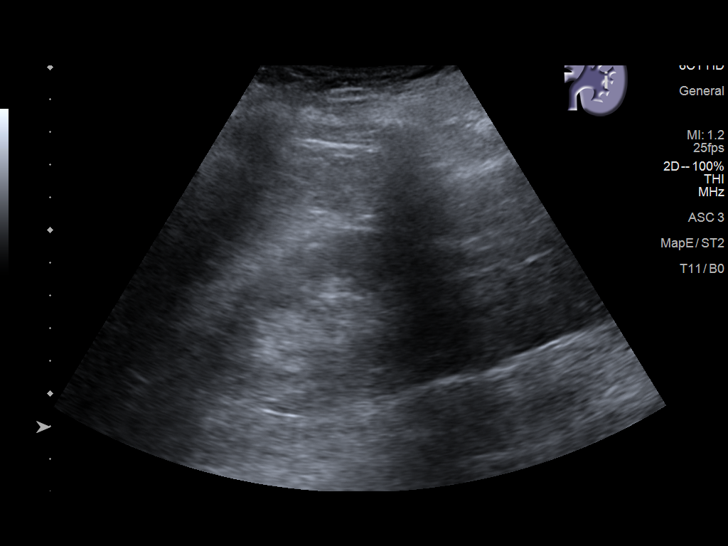
[im 28/37]
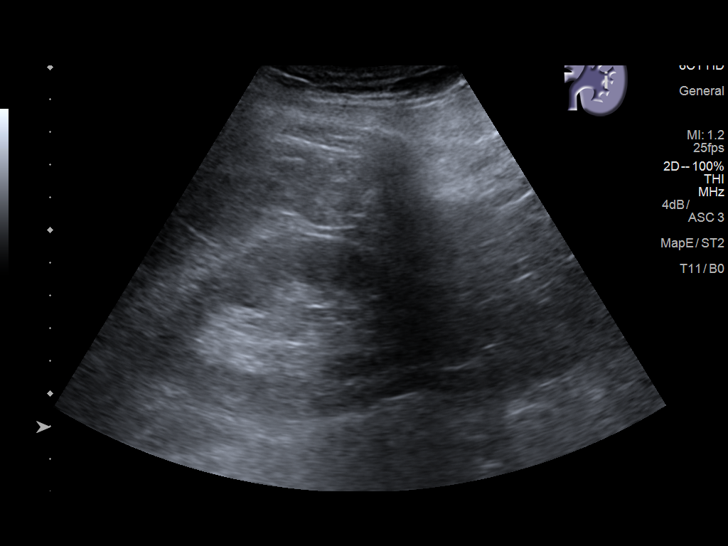
[im 31/37]
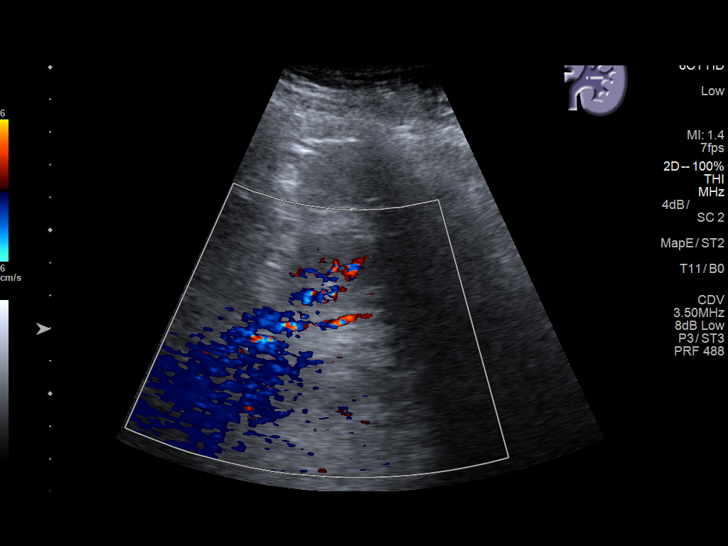
[im 34/37]
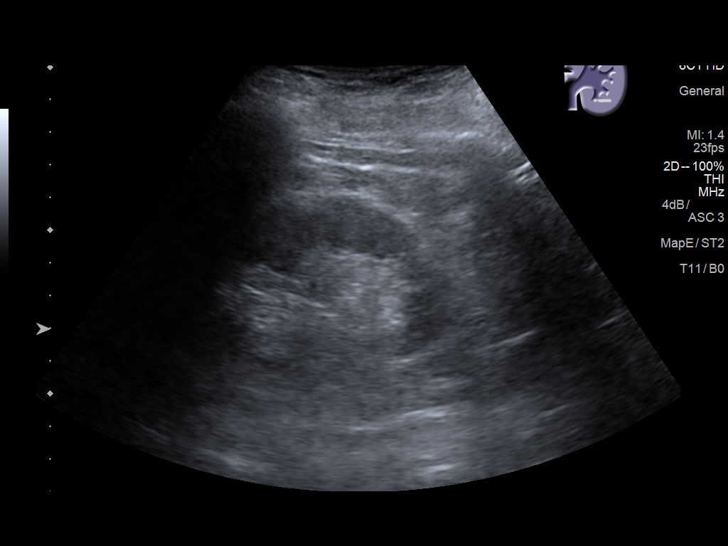
[im 37/37]
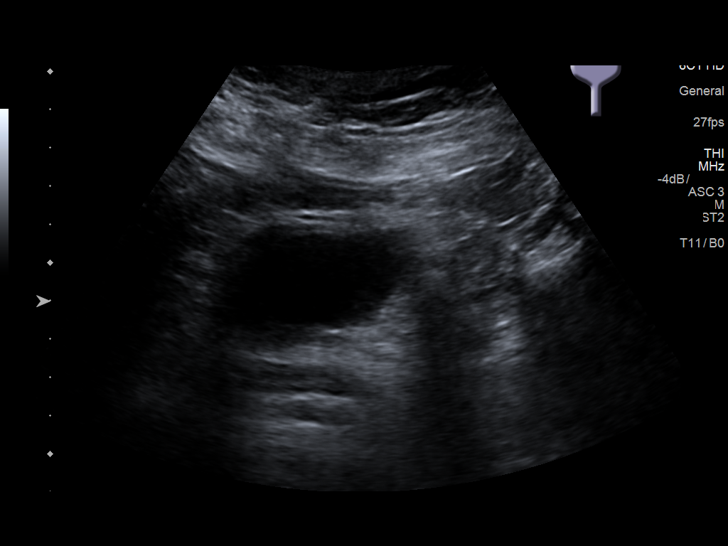

[14 of 25 positions shown; findings below may reference images not displayed]

FINDINGS: Right Kidney:

Length: 9.4 cm. Echogenicity similar to that of the adjacent liver.
No hydronephrosis. No large echogenic focus of the right kidney.

Left Kidney:

Length: 9.7 cm. Echogenicity of the left kidney similar to that of
the adjacent spleen parenchyma and of the contralateral kidney. No
hydronephrosis. No large echogenic focus.

Bladder:

Appears normal for degree of bladder distention.
IMPRESSION: No evidence of hydronephrosis.
